# Patient Record
Sex: Female | Born: 1945 | Race: White | Hispanic: No | Smoking: Never smoker
Health system: Southern US, Community
[De-identification: ages and names within clinical notes are randomized; demographics above are authoritative.]

## PROBLEM LIST (undated history)

## (undated) DIAGNOSIS — K219 Gastro-esophageal reflux disease without esophagitis: Secondary | ICD-10-CM

## (undated) DIAGNOSIS — E119 Type 2 diabetes mellitus without complications: Secondary | ICD-10-CM

## (undated) DIAGNOSIS — E039 Hypothyroidism, unspecified: Secondary | ICD-10-CM

## (undated) DIAGNOSIS — N189 Chronic kidney disease, unspecified: Secondary | ICD-10-CM

## (undated) DIAGNOSIS — I10 Essential (primary) hypertension: Secondary | ICD-10-CM

## (undated) DIAGNOSIS — F32A Depression, unspecified: Secondary | ICD-10-CM

## (undated) DIAGNOSIS — F329 Major depressive disorder, single episode, unspecified: Secondary | ICD-10-CM

## (undated) DIAGNOSIS — D649 Anemia, unspecified: Secondary | ICD-10-CM

## (undated) HISTORY — PX: HERNIA REPAIR: SHX51

## (undated) HISTORY — PX: BACK SURGERY: SHX140

## (undated) HISTORY — PX: ACHILLES TENDON REPAIR: SUR1153

## (undated) HISTORY — PX: SHOULDER SURGERY: SHX246

---

## 2006-12-30 ENCOUNTER — Emergency Department: Payer: Self-pay | Admitting: Emergency Medicine

## 2006-12-30 ENCOUNTER — Other Ambulatory Visit: Payer: Self-pay

## 2007-03-04 ENCOUNTER — Emergency Department: Payer: Self-pay | Admitting: Unknown Physician Specialty

## 2011-06-11 DIAGNOSIS — Z86010 Personal history of colonic polyps: Secondary | ICD-10-CM | POA: Insufficient documentation

## 2011-06-11 DIAGNOSIS — E782 Mixed hyperlipidemia: Secondary | ICD-10-CM | POA: Diagnosis present

## 2011-06-11 DIAGNOSIS — Z8742 Personal history of other diseases of the female genital tract: Secondary | ICD-10-CM | POA: Insufficient documentation

## 2011-06-11 DIAGNOSIS — M75100 Unspecified rotator cuff tear or rupture of unspecified shoulder, not specified as traumatic: Secondary | ICD-10-CM | POA: Insufficient documentation

## 2011-06-11 DIAGNOSIS — E039 Hypothyroidism, unspecified: Secondary | ICD-10-CM | POA: Insufficient documentation

## 2012-10-11 DIAGNOSIS — E669 Obesity, unspecified: Secondary | ICD-10-CM | POA: Insufficient documentation

## 2012-11-04 DIAGNOSIS — K219 Gastro-esophageal reflux disease without esophagitis: Secondary | ICD-10-CM | POA: Insufficient documentation

## 2013-02-17 DIAGNOSIS — I8393 Asymptomatic varicose veins of bilateral lower extremities: Secondary | ICD-10-CM | POA: Insufficient documentation

## 2014-01-31 DIAGNOSIS — M48061 Spinal stenosis, lumbar region without neurogenic claudication: Secondary | ICD-10-CM | POA: Insufficient documentation

## 2014-04-25 ENCOUNTER — Ambulatory Visit: Payer: Self-pay | Admitting: Family Medicine

## 2016-02-20 DIAGNOSIS — R9439 Abnormal result of other cardiovascular function study: Secondary | ICD-10-CM | POA: Insufficient documentation

## 2016-03-08 DIAGNOSIS — Z9889 Other specified postprocedural states: Secondary | ICD-10-CM | POA: Insufficient documentation

## 2016-03-09 DIAGNOSIS — I951 Orthostatic hypotension: Secondary | ICD-10-CM | POA: Insufficient documentation

## 2016-05-31 ENCOUNTER — Ambulatory Visit
Admission: EM | Admit: 2016-05-31 | Discharge: 2016-05-31 | Disposition: A | Discharge status: Home / Self Care | Payer: Medicare Other | Attending: Family Medicine | Admitting: Family Medicine

## 2016-05-31 ENCOUNTER — Encounter: Payer: Self-pay | Admitting: Emergency Medicine

## 2016-05-31 DIAGNOSIS — S90511A Abrasion, right ankle, initial encounter: Secondary | ICD-10-CM

## 2016-05-31 HISTORY — DX: Depression, unspecified: F32.A

## 2016-05-31 HISTORY — DX: Type 2 diabetes mellitus without complications: E11.9

## 2016-05-31 HISTORY — DX: Essential (primary) hypertension: I10

## 2016-05-31 HISTORY — DX: Major depressive disorder, single episode, unspecified: F32.9

## 2016-05-31 MED ORDER — MUPIROCIN 2 % EX OINT: TOPICAL_OINTMENT | CUTANEOUS | Status: DC

## 2016-05-31 NOTE — ED Notes (Signed)
Shoe was rubbing against heel of right foot. Now sore and red. Patient is diabetic

## 2016-05-31 NOTE — Discharge Instructions (Signed)
Use medication as prescribed.Keep clean as discussed.   Follow up with your primary care physician this week as needed. Return to Urgent care for new or worsening concerns.    Abrasion An abrasion is a cut or scrape on the outer surface of your skin. An abrasion does not extend through all of the layers of your skin. It is important to care for your abrasion properly to prevent infection. CAUSES Most abrasions are caused by falling on or gliding across the ground or another surface. When your skin rubs on something, the outer and inner layer of skin rubs off.  SYMPTOMS A cut or scrape is the main symptom of this condition. The scrape may be bleeding, or it may appear red or pink. If there was an associated fall, there may be an underlying bruise. DIAGNOSIS An abrasion is diagnosed with a physical exam. TREATMENT Treatment for this condition depends on how large and deep the abrasion is. Usually, your abrasion will be cleaned with water and mild soap. This removes any dirt or debris that may be stuck. An antibiotic ointment may be applied to the abrasion to help prevent infection. A bandage (dressing) may be placed on the abrasion to keep it clean. You may also need a tetanus shot. HOME CARE INSTRUCTIONS Medicines  Take or apply medicines only as directed by your health care provider.  If you were prescribed an antibiotic ointment, finish all of it even if you start to feel better. Wound Care  Clean the wound with mild soap and water 2-3 times per day or as directed by your health care provider. Pat your wound dry with a clean towel. Do not rub it.  There are many different ways to close and cover a wound. Follow instructions from your health care provider about:  Wound care.  Dressing changes and removal.  Check your wound every day for signs of infection. Watch for:  Redness, swelling, or pain.  Fluid, blood, or pus. General Instructions  Keep the dressing dry as directed by  your health care provider. Do not take baths, swim, use a hot tub, or do anything that would put your wound underwater until your health care provider approves.  If there is swelling, raise (elevate) the injured area above the level of your heart while you are sitting or lying down.  Keep all follow-up visits as directed by your health care provider. This is important. SEEK MEDICAL CARE IF:  You received a tetanus shot and you have swelling, severe pain, redness, or bleeding at the injection site.  Your pain is not controlled with medicine.  You have increased redness, swelling, or pain at the site of your wound. SEEK IMMEDIATE MEDICAL CARE IF:  You have a red streak going away from your wound.  You have a fever.  You have fluid, blood, or pus coming from your wound.  You notice a bad smell coming from your wound or your dressing.   This information is not intended to replace advice given to you by your health care provider. Make sure you discuss any questions you have with your health care provider.   Document Released: 08/26/2005 Document Revised: 08/07/2015 Document Reviewed: 11/14/2014 Elsevier Interactive Patient Education 2016 [employer]  Wound Care Taking care of your wound properly can help to prevent pain and infection. It can also help your wound to heal more quickly.  HOW TO CARE FOR YOUR WOUND  Take or apply over-the-counter and prescription medicines only as told by your  health care provider.  If you were prescribed antibiotic medicine, take or apply it as told by your health care provider. Do not stop using the antibiotic even if your condition improves.  Clean the wound each day or as told by your health care provider.  Wash the wound with mild soap and water.  Rinse the wound with water to remove all soap.  Pat the wound dry with a clean towel. Do not rub it.  There are many different ways to close and cover a wound. For example, a wound can be covered  with stitches (sutures), skin glue, or adhesive strips. Follow instructions from your health care provider about:  How to take care of your wound.  When and how you should change your bandage (dressing).  When you should remove your dressing.  Removing whatever was used to close your wound.  Check your wound every day for signs of infection. Watch for:  Redness, swelling, or pain.  Fluid, blood, or pus.  Keep the dressing dry until your health care provider says it can be removed. Do not take baths, swim, use a hot tub, or do anything that would put your wound underwater until your health care provider approves.  Raise (elevate) the injured area above the level of your heart while you are sitting or lying down.  Do not scratch or pick at the wound.  Keep all follow-up visits as told by your health care provider. This is important. SEEK MEDICAL CARE IF:  You received a tetanus shot and you have swelling, severe pain, redness, or bleeding at the injection site.  You have a fever.  Your pain is not controlled with medicine.  You have increased redness, swelling, or pain at the site of your wound.  You have fluid, blood, or pus coming from your wound.  You notice a bad smell coming from your wound or your dressing. SEEK IMMEDIATE MEDICAL CARE IF:  You have a red streak going away from your wound.   This information is not intended to replace advice given to you by your health care provider. Make sure you discuss any questions you have with your health care provider.   Document Released: 08/25/2008 Document Revised: 04/02/2015 Document Reviewed: 11/12/2014 Elsevier Interactive Patient Education [employer]

## 2016-05-31 NOTE — ED Provider Notes (Signed)
[facility]  ____________________________________________  Time seen: Approximately 2:39 PM  I have reviewed the triage vital signs and the nursing notes.   HISTORY  Chief Complaint Foot Injury   HPI Tamara [name] is a 70 y.o. female reports right posterior ankle wound from yesterday. Patient reports that yesterday afternoon she had been for a walk. Patient states that she was wearing a pair of tennis shoes without socks on. Patient states occasionally she'll have shoes rubbing the back of her ankle and cause sores and states that this is what occurred. Denies any fall or direct trauma. Patient states that her right shoe rubbed an area on the back of her heel that is sore and tender. Patient states that she wanted it evaluated as she is a diabetic and does have some peripheral neuropathy. Patient wanted to make sure that it does not become infected, per patient. Patient reports that the area was tender last night when she took over-the-counter Tylenol which helped. Patient states she is not elevated or applied ice. Denies any swelling. Denies any pain radiation, change in sensation, calf tenderness or leg swelling. Reports has remained active and ambulatory throughout the day.  PCP: Dayna BarkerAldridge   Past Medical History  Diagnosis Date  . Diabetes mellitus without complication (HCC)   . Hypertension   . Depression     There are no active problems to display for this patient.   Past Surgical History  Procedure Laterality Date  . Achilles tendon repair    . Back surgery      Current Outpatient Rx  Name  Route  Sig  Dispense  Refill  . atenolol (TENORMIN) 25 MG tablet   Oral   Take 25 mg by mouth daily.         Marland Kitchen. buPROPion (WELLBUTRIN SR) 200 MG 12 hr tablet   Oral   Take 200 mg by mouth 2 (two) times daily.         . insulin aspart protamine- aspart (NOVOLOG MIX 70/30) (70-30) 100 UNIT/ML injection   Subcutaneous   Inject 42 Units into the skin daily with  breakfast.         . metFORMIN (GLUMETZA) 1000 MG (MOD) 24 hr tablet   Oral   Take 1,000 mg by mouth 2 (two) times daily with a meal.         . valsartan (DIOVAN) 80 MG tablet   Oral   Take 80 mg by mouth daily.           Allergies Review of patient's allergies indicates no known allergies.  No family history on file.  Social History Social History  Substance Use Topics  . Smoking status: Never Smoker   . Smokeless tobacco: Never Used  . Alcohol Use: No    Review of Systems Constitutional: No fever/chills Eyes: No visual changes. ENT: No sore throat. Cardiovascular: Denies chest pain. Respiratory: Denies shortness of breath. Gastrointestinal: No abdominal pain.  No nausea, no vomiting.  No diarrhea.  No constipation. Genitourinary: Negative for dysuria. Musculoskeletal: Negative for back pain. Skin: Negative for rash. As above.  Neurological: Negative for headaches, focal weakness or numbness.  10-point ROS otherwise negative.  ____________________________________________   PHYSICAL EXAM:  VITAL SIGNS: ED Triage Vitals  Enc Vitals Group     BP 05/31/16 1409 129/80 mmHg     Pulse Rate 05/31/16 1409 84     Resp 05/31/16 1409 16     Temp 05/31/16 1409 97.6 F (36.4 C)  Temp src --      SpO2 05/31/16 1409 97 %     Weight 05/31/16 1409 186 lb (84.369 kg)     Height 05/31/16 1409 5\' 7"  (1.702 m)     Head Cir --      Peak Flow --      Pain Score 05/31/16 1409 7     Pain Loc --      Pain Edu? --      Excl. in GC? --     Constitutional: Alert and oriented. Well appearing and in no acute distress. Eyes: Conjunctivae are normal. PERRL. EOMI. Head: Atraumatic.  Ears: normal external appearance bilaterally.  Mouth/Throat: Mucous membranes are moist. Neck: No stridor.  No cervical spine tenderness to palpation. Cardiovascular: Normal rate, regular rhythm. Grossly normal heart sounds.  Good peripheral circulation. Respiratory: Normal respiratory  effort.  No retractions. Lungs CTAB. No wheezes, rales or rhonchi.  Gastrointestinal: Soft and nontender. Musculoskeletal: No lower or upper extremity tenderness nor edema. No cervical, thoracic or lumbar tenderness to palpation. Bilateral pedal pulses equal and easily palpated.  Neurologic:  Normal speech and language. No gross focal neurologic deficits are appreciated. No gait instability. Skin:  Skin is warm, dry and intact. No rash noted. Except: right posterior upper heel 1 cm superficial abrasion, crusted, minimal erythema, no surrounding erythema, nontender, no swelling, no bony tenderness, full range of motion.  Psychiatric: Mood and affect are normal. Speech and behavior are normal.  ____________________________________________   LABS (all labs ordered are listed, but only abnormal results are displayed)  Labs Reviewed - No data to display   INITIAL IMPRESSION / ASSESSMENT AND PLAN / ED COURSE  Pertinent labs & imaging results that were available during my care of the patient were reviewed by me and considered in my medical decision making (see chart for details).  Very well-appearing patient. No acute distress. Patient's presenting for superficial posterior calcaneus abrasion in which she obtained after appear shoes rubbing the area. Minimal erythema directly around abrasion site, no further surrounding erythema, nontender to palpation, no fluctuance or induration. Patient with a right posterior calcaneus abrasion. Discussed and wound cleaning and wound cleaner. Will treat with topical Bactroban and dressing applied. Encouraged keeping clean with soap and water, ice and elevate. Encourage monitoring at home.Discussed indication, risks and benefits of medications with patient.  Discussed follow up with Primary care physician this week. Discussed follow up and return parameters including no resolution or any worsening concerns. Patient verbalized understanding and agreed to plan.    ____________________________________________   FINAL CLINICAL IMPRESSION(S) / ED DIAGNOSES  Final diagnoses:  Ankle abrasion, right, initial encounter     Discharge Medication List as of 05/31/2016  2:41 PM    START taking these medications   Details  mupirocin ointment (BACTROBAN) 2 % Apply three times a day for 5 days., Normal        Note: This dictation was prepared with Dragon dictation along with smaller phrase technology. Any transcriptional errors that result from this process are unintentional.       Renford DillsLindsey Evangelia Whitaker, NP 06/02/16 [phone]

## 2016-06-26 ENCOUNTER — Ambulatory Visit
Admission: EM | Admit: 2016-06-26 | Discharge: 2016-06-26 | Disposition: A | Discharge status: Home / Self Care | Payer: Medicare Other | Attending: Family Medicine | Admitting: Family Medicine

## 2016-06-26 DIAGNOSIS — W57XXXA Bitten or stung by nonvenomous insect and other nonvenomous arthropods, initial encounter: Secondary | ICD-10-CM

## 2016-06-26 DIAGNOSIS — T148 Other injury of unspecified body region: Secondary | ICD-10-CM

## 2016-06-26 MED ORDER — CEPHALEXIN 500 MG PO CAPS: 500.0000 mg | ORAL_CAPSULE | Freq: Three times a day (TID) | ORAL | 0 refills | Status: DC

## 2016-06-26 NOTE — ED Provider Notes (Signed)
[facility]    CSN: [id number] Arrival date & time: 06/26/16  1300  First Provider Contact:  None       History   Chief Complaint Chief Complaint  Patient presents with  . Insect Bite    HPI Zylah Tutt is a 70 y.o. female.   70 yo diabetic female with a c/o right shin redness and pain at puncture wound, possibly from insect bite. Denies any fevers, chills, swelling.    The history is provided by the patient.    Past Medical History:  Diagnosis Date  . Depression   . Diabetes mellitus without complication (HCC)   . Hypertension     There are no active problems to display for this patient.   Past Surgical History:  Procedure Laterality Date  . ACHILLES TENDON REPAIR    . BACK SURGERY      OB History    No data available       Home Medications    Prior to Admission medications   Medication Sig Start Date End Date Taking? Authorizing Provider  atenolol (TENORMIN) 25 MG tablet Take 25 mg by mouth daily.   Yes Historical Provider, MD  buPROPion (WELLBUTRIN SR) 200 MG 12 hr tablet Take 200 mg by mouth 2 (two) times daily.   Yes Historical Provider, MD  insulin aspart protamine- aspart (NOVOLOG MIX 70/30) (70-30) 100 UNIT/ML injection Inject 42 Units into the skin daily with breakfast.   Yes Historical Provider, MD  metFORMIN (GLUMETZA) 1000 MG (MOD) 24 hr tablet Take 1,000 mg by mouth 2 (two) times daily with a meal.   Yes Historical Provider, MD  valsartan (DIOVAN) 80 MG tablet Take 80 mg by mouth daily.   Yes Historical Provider, MD  cephALEXin (KEFLEX) 500 MG capsule Take 1 capsule (500 mg total) by mouth 3 (three) times daily. 06/26/16   Payton Mccallum, MD  mupirocin ointment (BACTROBAN) 2 % Apply three times a day for 5 days. 05/31/16   Renford Dills, NP    Family History History reviewed. No pertinent family history.  Social History Social History  Substance Use Topics  . Smoking status: Never Smoker  . Smokeless tobacco: Never Used  .  Alcohol use No     Allergies   Review of patient's allergies indicates no known allergies.   Review of Systems Review of Systems   Physical Exam Triage Vital Signs ED Triage Vitals  Enc Vitals Group     BP 06/26/16 1430 119/71     Pulse Rate 06/26/16 1430 72     Resp 06/26/16 1430 18     Temp 06/26/16 1430 98.2 F (36.8 C)     Temp Source 06/26/16 1430 Oral     SpO2 06/26/16 1430 97 %     Weight 06/26/16 1430 185 lb (83.9 kg)     Height 06/26/16 1430 5\' 7"  (1.702 m)     Head Circumference --      Peak Flow --      Pain Score 06/26/16 1429 8     Pain Loc --      Pain Edu? --      Excl. in GC? --    No data found.   Updated Vital Signs BP 119/71 (BP Location: Right Arm)   Pulse 72   Temp 98.2 F (36.8 C) (Oral)   Resp 18   Ht 5\' 7"  (1.702 m)   Wt 185 lb (83.9 kg)   SpO2 97%   BMI 28.98 kg/m  Visual Acuity Right Eye Distance:   Left Eye Distance:   Bilateral Distance:    Right Eye Near:   Left Eye Near:    Bilateral Near:     Physical Exam  Constitutional: She appears well-developed and well-nourished. No distress.  Skin: She is not diaphoretic. There is erythema.  1.5 cm erythematous skin area on right shin with pinpoint puncture wound; no drainage  Nursing note and vitals reviewed.    UC Treatments / Results  Labs (all labs ordered are listed, but only abnormal results are displayed) Labs Reviewed - No data to display  EKG  EKG Interpretation None       Radiology No results found.  Procedures Procedures (including critical care time)  Medications Ordered in UC Medications - No data to display   Initial Impression / Assessment and Plan / UC Course  I have reviewed the triage vital signs and the nursing notes.  Pertinent labs & imaging results that were available during my care of the patient were reviewed by me and considered in my medical decision making (see chart for details).  Clinical Course      Final Clinical  Impressions(s) / UC Diagnoses   Final diagnoses:  Insect bite    New Prescriptions Discharge Medication List as of 06/26/2016  2:49 PM    START taking these medications   Details  cephALEXin (KEFLEX) 500 MG capsule Take 1 capsule (500 mg total) by mouth 3 (three) times daily., Starting Fri 06/26/2016, Normal        1.  Diagnosis reviewed with patient 2. rx as per orders above; reviewed possible side effects, interactions, risks and benefits  3. Recommend supportive treatment with warm compresses 4. Follow-up prn if symptoms worsen or don't improve   Payton Mccallum, MD 06/26/16 1521

## 2016-06-26 NOTE — ED Triage Notes (Signed)
Patient woke up with pain on her right knee, and saw what appeared to her to be a insect bite. She has tried over the counter meds to help but with no relief. There is a small blister in this area.

## 2016-06-29 ENCOUNTER — Other Ambulatory Visit: Payer: Self-pay | Admitting: Family Medicine

## 2016-06-29 DIAGNOSIS — Z780 Asymptomatic menopausal state: Secondary | ICD-10-CM

## 2016-09-24 DIAGNOSIS — R259 Unspecified abnormal involuntary movements: Secondary | ICD-10-CM | POA: Insufficient documentation

## 2016-09-24 DIAGNOSIS — R2681 Unsteadiness on feet: Secondary | ICD-10-CM | POA: Insufficient documentation

## 2016-09-28 ENCOUNTER — Other Ambulatory Visit: Payer: Self-pay | Admitting: Nurse Practitioner

## 2016-09-28 DIAGNOSIS — R251 Tremor, unspecified: Secondary | ICD-10-CM

## 2016-09-28 DIAGNOSIS — R2681 Unsteadiness on feet: Secondary | ICD-10-CM

## 2016-10-10 ENCOUNTER — Ambulatory Visit
Admission: RE | Admit: 2016-10-10 | Discharge: 2016-10-10 | Disposition: A | Discharge status: Home / Self Care | Payer: Medicare Other | Source: Ambulatory Visit | Attending: Nurse Practitioner | Admitting: Nurse Practitioner

## 2016-10-10 DIAGNOSIS — R251 Tremor, unspecified: Secondary | ICD-10-CM | POA: Insufficient documentation

## 2016-10-10 DIAGNOSIS — G319 Degenerative disease of nervous system, unspecified: Secondary | ICD-10-CM | POA: Insufficient documentation

## 2016-10-10 DIAGNOSIS — R2681 Unsteadiness on feet: Secondary | ICD-10-CM | POA: Diagnosis not present

## 2016-10-10 LAB — POCT I-STAT CREATININE: CREATININE: 1.1 mg/dL — AB (ref 0.44–1.00)

## 2016-10-10 MED ORDER — GADOBENATE DIMEGLUMINE 529 MG/ML IV SOLN
20.0000 mL | Freq: Once | INTRAVENOUS | Status: AC | PRN
  Administered 2016-10-10: 17 mL via INTRAVENOUS

## 2016-10-28 ENCOUNTER — Ambulatory Visit
Admission: EM | Admit: 2016-10-28 | Discharge: 2016-10-28 | Disposition: A | Discharge status: Home / Self Care | Payer: Medicare Other | Attending: Family Medicine | Admitting: Family Medicine

## 2016-10-28 ENCOUNTER — Ambulatory Visit: Payer: Medicare Other | Attending: Family Medicine

## 2016-10-28 ENCOUNTER — Ambulatory Visit: Admit: 2016-10-28 | Payer: Medicare Other

## 2016-10-28 ENCOUNTER — Ambulatory Visit (HOSPITAL_COMMUNITY): Payer: Medicare Other

## 2016-10-28 DIAGNOSIS — S90812A Abrasion, left foot, initial encounter: Secondary | ICD-10-CM | POA: Diagnosis not present

## 2016-10-28 DIAGNOSIS — M79672 Pain in left foot: Secondary | ICD-10-CM | POA: Insufficient documentation

## 2016-10-28 DIAGNOSIS — R52 Pain, unspecified: Secondary | ICD-10-CM

## 2016-10-28 DIAGNOSIS — S9032XA Contusion of left foot, initial encounter: Secondary | ICD-10-CM | POA: Diagnosis not present

## 2016-10-28 MED ORDER — TETANUS-DIPHTH-ACELL PERTUSSIS 5-2.5-18.5 LF-MCG/0.5 IM SUSP
0.5000 mL | Freq: Once | INTRAMUSCULAR | Status: AC
  Administered 2016-10-28: 0.5 mL via INTRAMUSCULAR

## 2016-10-28 NOTE — ED Notes (Signed)
Post op shoe applied. PMS intact post application

## 2016-10-28 NOTE — Discharge Instructions (Signed)
Rest. Ice. Elevate. Wear boot as long as pain continues.   Follow up with podiatry as discussed.   Follow up with your primary care physician this week as needed. Return to Urgent care for new or worsening concerns.

## 2016-10-28 NOTE — ED Provider Notes (Signed)
[facility] ____________________________________________  Time seen: Approximately 2:18 PM  I have reviewed the triage vital signs and the nursing notes.   HISTORY  Chief Complaint Foot Injury   HPI Tamara [name] is a 70 y.o. female presenting for the complaints of left foot pain. Patient reports that 2 days ago she accidentally dropped a book end on her left foot and ankle. Patient reports mild pain at first, pain improved, however pain worsened this morning. Denies any other fall or injury. Denies history of similar. Denies pain radiation. Patient reports that she does have baseline peripheral neuropathy, denies any acute change in sensation. Denies any decreased movement but reports mild pain at this time. Patient reports over-the-counter Aleve has been helping her symptoms. Denies fall to the ground. Denies head injury or loss consciousness. Denies any other pain or injury. Patient unsure of last tetanus immunization.  [facility]: PCP     Past Medical History:  Diagnosis Date  . Depression   . Diabetes mellitus without complication (HCC)   . Hypertension     There are no active problems to display for this patient.   Past Surgical History:  Procedure Laterality Date  . ACHILLES TENDON REPAIR    . BACK SURGERY    . HERNIA REPAIR    . SHOULDER SURGERY      Current Outpatient Rx  . Order #: [id number] Class: Historical Med  . Order #: [id number] Class: Historical Med  . Order #: [id number] Class: Historical Med  . Order #: [id number] Class: Historical Med  . Order #: [id number] Class: Historical Med  . Order #: [id number] Class: Normal  . Order #: [id number] Class: Historical Med  . Order #: [id number] Class: Historical Med  . Order #: [id number] Class: Normal  . Order #: [id number] Class: Historical Med    No current facility-administered medications for this encounter.   Current Outpatient Prescriptions:  .  cyclobenzaprine (FLEXERIL) 10 MG tablet,  Take 10 mg by mouth 3 (three) times daily as needed for muscle spasms., Disp: , Rfl:  .  DULoxetine (CYMBALTA) 60 MG capsule, Take 60 mg by mouth daily., Disp: , Rfl:  .  metoprolol succinate (TOPROL-XL) 25 MG 24 hr tablet, Take 25 mg by mouth daily., Disp: , Rfl:  .  atenolol (TENORMIN) 25 MG tablet, Take 25 mg by mouth daily., Disp: , Rfl:  .  buPROPion (WELLBUTRIN SR) 200 MG 12 hr tablet, Take 200 mg by mouth 2 (two) times daily., Disp: , Rfl:  .  cephALEXin (KEFLEX) 500 MG capsule, Take 1 capsule (500 mg total) by mouth 3 (three) times daily., Disp: 21 capsule, Rfl: 0 .  insulin aspart protamine- aspart (NOVOLOG MIX 70/30) (70-30) 100 UNIT/ML injection, Inject 42 Units into the skin daily with breakfast., Disp: , Rfl:  .  metFORMIN (GLUMETZA) 1000 MG (MOD) 24 hr tablet, Take 1,000 mg by mouth 2 (two) times daily with a meal., Disp: , Rfl:  .  mupirocin ointment (BACTROBAN) 2 %, Apply three times a day for 5 days., Disp: 22 g, Rfl: 0 .  valsartan (DIOVAN) 80 MG tablet, Take 40 mg by mouth daily. , Disp: , Rfl:   Allergies Patient has no known allergies.  History reviewed. No pertinent family history.  Social History Social History  Substance Use Topics  . Smoking status: Never Smoker  . Smokeless tobacco: Never Used  . Alcohol use No    Review of Systems Constitutional: No fever/chills Eyes: No visual changes. ENT: No sore throat. Cardiovascular: Denies chest pain. Respiratory: Denies shortness  of breath. Gastrointestinal: No abdominal pain.  No nausea, no vomiting.  No diarrhea.  No constipation. Genitourinary: Negative for dysuria. Musculoskeletal: Negative for back pain.As above.  Skin: Negative for rash. Neurological: Negative for headaches, focal weakness or numbness.  10-point ROS otherwise negative.  ____________________________________________   PHYSICAL EXAM:  VITAL SIGNS: ED Triage Vitals  Enc Vitals Group     BP 10/28/16 1330 132/71     Pulse Rate 10/28/16  1330 83     Resp 10/28/16 1330 20     Temp 10/28/16 1330 98.2 F (36.8 C)     Temp Source 10/28/16 1330 Oral     SpO2 10/28/16 1330 97 %     Weight 10/28/16 1331 190 lb (86.2 kg)     Height 10/28/16 1331 5\' 7"  (1.702 m)     Head Circumference --      Peak Flow --      Pain Score 10/28/16 1335 5     Pain Loc --      Pain Edu? --      Excl. in GC? --     Constitutional: Alert and oriented. Well appearing and in no acute distress. Eyes: Conjunctivae are normal. PERRL. EOMI. ENT      Head: Normocephalic and atraumatic.      Mouth/Throat: Mucous membranes are moist.Oropharynx non-erythematous. Cardiovascular: Normal rate, regular rhythm. Grossly normal heart sounds.  Good peripheral circulation. Respiratory: Normal respiratory effort without tachypnea nor retractions. Breath sounds are clear and equal bilaterally. No wheezes/rales/rhonchi.. Musculoskeletal:  Nontender with normal range of motion in all extremities.  Bilateral pedal pulses equal and easily palpated.      Right lower leg:  No tenderness or edema.      Left lower leg:  No tenderness or edema. Except: Left lateral foot mild tenderness to palpation. Left dorsal mid foot localized mild to moderate tenderness to direct palpation over proximal second and third and fourth tarsals, minimal swelling, minimal ecchymosis, lateral foot with superficial abrasion noted without surrounding erythema drainage or induration, full range of motion present, no motor or tendon deficit, mild pain with plantar flexion and dorsiflexion resisted, slight diminished distal sensation which per patient has chronic secondary to peripheral neuropathy, normal distal capillary refill. Ambulatory with mild antalgic gait. Neurologic:  Normal speech and language. No gross focal neurologic deficits are appreciated. Speech is normal.   Skin:  Skin is warm, dry and intact. No rash noted. Psychiatric: Mood and affect are normal. Speech and behavior are normal. Patient  exhibits appropriate insight and judgment   ___________________________________________   LABS (all labs ordered are listed, but only abnormal results are displayed)  Labs Reviewed - No data to display   RADIOLOGY  Dg Foot Complete Left  Result Date: 10/28/2016 CLINICAL DATA:  70 year old female dropped a book on her foot 2 days ago with bruising and pain. Initial encounter. EXAM: LEFT FOOT - COMPLETE 3+ VIEW COMPARISON:  None. FINDINGS: Bone mineralization is within normal limits for age. Calcaneus intact with degenerative spurring. Tarsal bone alignment appears normal. Accessory ossicle adjacent to the navicular. Metatarsals and phalanges appear intact. Distal joint spaces and alignment within normal limits. IMPRESSION: No acute fracture or dislocation identified about the left foot. Electronically Signed   By: Odessa FlemingH  Hall M.D.   On: 10/28/2016 15:37   ____________________________________________   PROCEDURES Procedures   ___________________________________________   INITIAL IMPRESSION / ASSESSMENT AND PLAN / ED COURSE  Pertinent labs & imaging results that were available during my care of the  patient were reviewed by me and considered in my medical decision making (see chart for details).   Well-appearing patient. No acute distress. Presents with complaints of left foot pain post mechanical injury. Will evaluate left foot x-ray. Superficial abrasion noted from acute injury, unsure of last tetanus immunization, will update tetanus.   3pm: Issues present regarding x-ray processing, still awaiting x-rays.   Left foot x-ray reviewed and per radiologist no acute bony abnormality noted. Discussed in detail with patient proximal second and third metatarsal area of concern of possible fracture, and discussed with patient will place in postop shoe, encouraged ice, elevation and rest. Discussed in detail with patient to follow-up with podiatry in 1 week as needed for any continued pain for  reassessment and reevaluation of possible fracture. Patient denies need for pain medication and she states that she has Aleve and tramadol at home as needed. Patient and spouse at bedside and provides understanding and agreed to this plan. Podiatry information given.  Discussed follow up with Primary care physician this week. Discussed follow up and return parameters including no resolution or any worsening concerns. Patient verbalized understanding and agreed to plan.   ____________________________________________   FINAL CLINICAL IMPRESSION(S) / ED DIAGNOSES  Final diagnoses:  Contusion of left foot, initial encounter  Abrasion of left foot, initial encounter     Discharge Medication List as of 10/28/2016  3:52 PM      Note: This dictation was prepared with Dragon dictation along with smaller phrase technology. Any transcriptional errors that result from this process are unintentional.    Clinical Course       Renford Dills, NP 10/28/16 1620

## 2016-10-28 NOTE — ED Triage Notes (Signed)
Pt reports she dropped a heavy book-end on her left foot. Left ankle bruising noted although she denies pain to the ankle. Foot was swollen this a.m. And has been icing and elevating it. Pain is to dorsum of foot near the toes. Pain 5/10.

## 2016-11-03 ENCOUNTER — Other Ambulatory Visit: Payer: Self-pay | Admitting: Orthopedic Surgery

## 2016-11-03 DIAGNOSIS — M4712 Other spondylosis with myelopathy, cervical region: Secondary | ICD-10-CM

## 2016-11-03 DIAGNOSIS — M48062 Spinal stenosis, lumbar region with neurogenic claudication: Secondary | ICD-10-CM

## 2016-11-13 ENCOUNTER — Ambulatory Visit: Payer: Medicare Other

## 2017-02-05 DIAGNOSIS — L409 Psoriasis, unspecified: Secondary | ICD-10-CM | POA: Insufficient documentation

## 2017-02-05 DIAGNOSIS — M4714 Other spondylosis with myelopathy, thoracic region: Secondary | ICD-10-CM | POA: Insufficient documentation

## 2017-02-05 DIAGNOSIS — Z981 Arthrodesis status: Secondary | ICD-10-CM | POA: Insufficient documentation

## 2017-02-05 DIAGNOSIS — Z8614 Personal history of Methicillin resistant Staphylococcus aureus infection: Secondary | ICD-10-CM | POA: Insufficient documentation

## 2017-02-05 DIAGNOSIS — G4730 Sleep apnea, unspecified: Secondary | ICD-10-CM | POA: Diagnosis present

## 2017-02-15 DIAGNOSIS — M4324 Fusion of spine, thoracic region: Secondary | ICD-10-CM | POA: Insufficient documentation

## 2017-03-30 ENCOUNTER — Ambulatory Visit: Payer: Medicare Other

## 2017-03-30 ENCOUNTER — Ambulatory Visit
Admission: EM | Admit: 2017-03-30 | Discharge: 2017-03-30 | Disposition: A | Discharge status: Home / Self Care | Payer: Medicare Other | Attending: Family Medicine | Admitting: Family Medicine

## 2017-03-30 DIAGNOSIS — E119 Type 2 diabetes mellitus without complications: Secondary | ICD-10-CM | POA: Diagnosis not present

## 2017-03-30 DIAGNOSIS — Z7984 Long term (current) use of oral hypoglycemic drugs: Secondary | ICD-10-CM | POA: Insufficient documentation

## 2017-03-30 DIAGNOSIS — S62112A Displaced fracture of triquetrum [cuneiform] bone, left wrist, initial encounter for closed fracture: Secondary | ICD-10-CM

## 2017-03-30 DIAGNOSIS — F329 Major depressive disorder, single episode, unspecified: Secondary | ICD-10-CM | POA: Diagnosis not present

## 2017-03-30 DIAGNOSIS — W19XXXA Unspecified fall, initial encounter: Secondary | ICD-10-CM | POA: Diagnosis not present

## 2017-03-30 DIAGNOSIS — M25531 Pain in right wrist: Secondary | ICD-10-CM | POA: Diagnosis present

## 2017-03-30 DIAGNOSIS — Z9889 Other specified postprocedural states: Secondary | ICD-10-CM | POA: Insufficient documentation

## 2017-03-30 DIAGNOSIS — S62115A Nondisplaced fracture of triquetrum [cuneiform] bone, left wrist, initial encounter for closed fracture: Secondary | ICD-10-CM | POA: Insufficient documentation

## 2017-03-30 DIAGNOSIS — X58XXXA Exposure to other specified factors, initial encounter: Secondary | ICD-10-CM | POA: Diagnosis not present

## 2017-03-30 DIAGNOSIS — Z79899 Other long term (current) drug therapy: Secondary | ICD-10-CM | POA: Diagnosis not present

## 2017-03-30 DIAGNOSIS — I10 Essential (primary) hypertension: Secondary | ICD-10-CM | POA: Diagnosis not present

## 2017-03-30 NOTE — ED Notes (Signed)
Ice to left wrist.

## 2017-03-30 NOTE — ED Provider Notes (Signed)
CSN: [id number]     Arrival date & time 03/30/17  1323 History   First MD Initiated Contact with Patient 03/30/17 1447     Chief Complaint  Patient presents with  . Wrist Pain   (Consider location/radiation/quality/duration/timing/severity/associated sxs/prior Treatment) HPI  This is a 71 year old female who presents accompanied by her husband when she fell last night onto her left side injuring her hip knee and her left wrist. The pain presently is of her left wrist just distal to the ulnar styloid. There is ecchymosis and swelling in this area. She states the pain is 8/10. She remembers the fall did not have dizziness or near-syncope or syncopal episode. She's had multiple back surgeries her last one in March and has had several falls over the past month. He did fall in the nursing home where she was undergoing rehabilitation x-rays of her wrist were taking at that time which did not show any fractures. Her knee and hip are not bothering her at the present time.       Past Medical History:  Diagnosis Date  . Depression   . Diabetes mellitus without complication (HCC)   . Hypertension    Past Surgical History:  Procedure Laterality Date  . ACHILLES TENDON REPAIR    . BACK SURGERY    . HERNIA REPAIR    . SHOULDER SURGERY     Family History  Problem Relation Age of Onset  . Cancer Mother    Social History  Substance Use Topics  . Smoking status: Never Smoker  . Smokeless tobacco: Never Used  . Alcohol use No   OB History    No data available     Review of Systems  Constitutional: Positive for activity change. Negative for appetite change, chills, fatigue and fever.  Musculoskeletal: Positive for joint swelling.  All other systems reviewed and are negative.   Allergies  Patient has no known allergies.  Home Medications   Prior to Admission medications   Medication Sig Start Date End Date Taking? Authorizing Provider  acetaminophen (TYLENOL) 325 MG tablet Take 650  mg by mouth every 6 (six) hours as needed.   Yes Historical Provider, MD  oxycodone (OXY-IR) 5 MG capsule Take 5 mg by mouth every 4 (four) hours as needed.   Yes Historical Provider, MD  atenolol (TENORMIN) 25 MG tablet Take 25 mg by mouth daily.    Historical Provider, MD  buPROPion (WELLBUTRIN SR) 200 MG 12 hr tablet Take 200 mg by mouth 2 (two) times daily.    Historical Provider, MD  cyclobenzaprine (FLEXERIL) 10 MG tablet Take 10 mg by mouth 3 (three) times daily as needed for muscle spasms.    Historical Provider, MD  DULoxetine (CYMBALTA) 60 MG capsule Take 60 mg by mouth daily.    Historical Provider, MD  insulin aspart protamine- aspart (NOVOLOG MIX 70/30) (70-30) 100 UNIT/ML injection Inject 42 Units into the skin daily with breakfast.    Historical Provider, MD  metFORMIN (GLUMETZA) 1000 MG (MOD) 24 hr tablet Take 1,000 mg by mouth 2 (two) times daily with a meal.    Historical Provider, MD  metoprolol succinate (TOPROL-XL) 25 MG 24 hr tablet Take 25 mg by mouth daily.    Historical Provider, MD  mupirocin ointment (BACTROBAN) 2 % Apply three times a day for 5 days. 05/31/16   Renford Dills, NP  valsartan (DIOVAN) 80 MG tablet Take 40 mg by mouth daily.     Historical Provider, MD   Meds Ordered  and Administered this Visit  Medications - No data to display  BP 127/75 (BP Location: Left Arm)   Pulse 89   Temp 98.7 F (37.1 C) (Oral)   Resp 18   Ht  (1.702 m)   Wt 180 lb (81.6 kg)   SpO2 97%   BMI 28.19 kg/m  No data found.   Physical Exam  Constitutional: She appears well-developed and well-nourished. No distress.  HENT:  Head: Normocephalic.  Eyes: EOM are normal. Pupils are equal, round, and reactive to light. Right eye exhibits no discharge. Left eye exhibits no discharge.  Neck: Normal range of motion.  Musculoskeletal: She exhibits edema, tenderness and deformity.  Examination of the left nondominant wrist was so tenderness and swelling just distal to the ulnar  styloid. There is a small amount of ecchymosis present. Patient is reluctant to flex or extend her wrist. Ulnar deviation and radial deviation are also painful. Pronation supination are not painful. Vascular function is intact distally.  Neurological: She is alert.  Skin: Skin is warm and dry. She is not diaphoretic.  Psychiatric: She has a normal mood and affect. Her behavior is normal. Judgment and thought content normal.  Nursing note and vitals reviewed.   Urgent Care Course     Procedures (including critical care time)  Labs Review Labs Reviewed - No data to display  Imaging Review Dg Wrist Complete Left  Result Date: 03/30/2017 CLINICAL DATA:  Pain following fall EXAM: LEFT WRIST - COMPLETE 3+ VIEW COMPARISON:  None. FINDINGS: Frontal, oblique, lateral, and ulnar deviation scaphoid images were obtained it. There is an avulsion off the dorsal aspect of the triquetrum, best appreciated on the lateral view. No other fracture is evident. There is scapholunate disassociation. No frank dislocation. There is calcification in the triangular fibrocartilage region as well as in the scapholunate junction region. There is no appreciable joint space narrowing or erosion. IMPRESSION: Fracture arising from the dorsal triquetrum. 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 Calcification in the triangular fibrocartilage is likely due to chronic tear in this area. No frank dislocation. These results will be called to the ordering clinician or representative by the Radiologist Assistant, and communication documented in the PACS or zVision Dashboard. Electronically Signed   By: Bretta Bang III M.D.   On: 03/30/2017 14:18     Visual Acuity Review  Right Eye Distance:   Left Eye Distance:   Bilateral Distance:    Right Eye Near:   Left Eye Near:    Bilateral Near:    Patient was given an ulnar gutter splint.     MDM   1. Triquetral  chip fracture, left, closed, initial encounter    Plan: 1. Test/x-ray results and diagnosis reviewed with patient 2. rx as per orders; risks, benefits, potential side effects reviewed with patient 3. Recommend supportive treatment with Ice and elevation which was instructed to the patient. She is on oxycodone already for her recent back surgery. Recommended that she consider using ibuprofen as necessary to supplement the oxycodone for pain. She will require orthopedic evaluation and follow-up. The patient was given the name and address phone number of a local group. He'll call this afternoon to schedule an appointment as soon as possible. 4. F/u prn if symptoms worsen or don't improve     Lutricia Feil, PA-C 03/30/17 1537

## 2017-03-30 NOTE — ED Triage Notes (Addendum)
Pt reports she fell last night and fell onto left side injuring her hip, knee, and her left wrist. Pain 8/10 with mild swelling to left wrist. Knee and hip "aren't really bothering me".  Fell to the floor and unsure how she had her hand positioned. Denies dizziness prior to fall. She has had multiple back surgeries and has had several falls over past month. Uses a walker but wasn't using it last p.m.

## 2017-05-13 ENCOUNTER — Other Ambulatory Visit: Payer: Self-pay | Admitting: Surgery

## 2017-05-13 DIAGNOSIS — K439 Ventral hernia without obstruction or gangrene: Secondary | ICD-10-CM

## 2017-05-18 ENCOUNTER — Ambulatory Visit
Admission: RE | Admit: 2017-05-18 | Discharge: 2017-05-18 | Disposition: A | Discharge status: Home / Self Care | Payer: Medicare Other | Source: Ambulatory Visit | Attending: Surgery | Admitting: Surgery

## 2017-05-18 ENCOUNTER — Other Ambulatory Visit
Admission: RE | Admit: 2017-05-18 | Discharge: 2017-05-18 | Disposition: A | Discharge status: Home / Self Care | Payer: Medicare Other | Source: Ambulatory Visit | Attending: Surgery | Admitting: Surgery

## 2017-05-18 DIAGNOSIS — K439 Ventral hernia without obstruction or gangrene: Secondary | ICD-10-CM | POA: Insufficient documentation

## 2017-05-18 DIAGNOSIS — I700 Atherosclerosis of aorta: Secondary | ICD-10-CM | POA: Diagnosis not present

## 2017-05-18 LAB — BUN: BUN: 32 mg/dL — AB (ref 6–20)

## 2017-05-18 LAB — CREATININE, SERUM
Creatinine, Ser: 1.16 mg/dL — ABNORMAL HIGH (ref 0.44–1.00)
GFR calc Af Amer: 54 mL/min — ABNORMAL LOW (ref >60)
GFR calc non Af Amer: 47 mL/min — ABNORMAL LOW (ref >60)

## 2017-05-18 MED ORDER — IOPAMIDOL (ISOVUE-300) INJECTION 61%
100.0000 mL | Freq: Once | INTRAVENOUS | Status: AC | PRN
  Administered 2017-05-18: 100 mL via INTRAVENOUS

## 2017-06-10 DIAGNOSIS — G250 Essential tremor: Secondary | ICD-10-CM | POA: Insufficient documentation

## 2017-06-10 DIAGNOSIS — E1140 Type 2 diabetes mellitus with diabetic neuropathy, unspecified: Secondary | ICD-10-CM | POA: Insufficient documentation

## 2017-08-16 DIAGNOSIS — R32 Unspecified urinary incontinence: Secondary | ICD-10-CM | POA: Insufficient documentation

## 2017-09-30 DIAGNOSIS — N183 Chronic kidney disease, stage 3 unspecified: Secondary | ICD-10-CM | POA: Insufficient documentation

## 2017-11-24 ENCOUNTER — Emergency Department: Payer: Medicare Other

## 2017-11-24 ENCOUNTER — Emergency Department
Admission: EM | Admit: 2017-11-24 | Discharge: 2017-11-24 | Disposition: A | Discharge status: Home / Self Care | Payer: Medicare Other | Attending: Emergency Medicine | Admitting: Emergency Medicine

## 2017-11-24 ENCOUNTER — Encounter: Payer: Self-pay | Admitting: Emergency Medicine

## 2017-11-24 DIAGNOSIS — W010XXA Fall on same level from slipping, tripping and stumbling without subsequent striking against object, initial encounter: Secondary | ICD-10-CM | POA: Diagnosis not present

## 2017-11-24 DIAGNOSIS — Y929 Unspecified place or not applicable: Secondary | ICD-10-CM | POA: Insufficient documentation

## 2017-11-24 DIAGNOSIS — Z794 Long term (current) use of insulin: Secondary | ICD-10-CM | POA: Diagnosis not present

## 2017-11-24 DIAGNOSIS — S4991XA Unspecified injury of right shoulder and upper arm, initial encounter: Secondary | ICD-10-CM | POA: Diagnosis present

## 2017-11-24 DIAGNOSIS — E119 Type 2 diabetes mellitus without complications: Secondary | ICD-10-CM | POA: Diagnosis not present

## 2017-11-24 DIAGNOSIS — S42211A Unspecified displaced fracture of surgical neck of right humerus, initial encounter for closed fracture: Secondary | ICD-10-CM | POA: Diagnosis not present

## 2017-11-24 DIAGNOSIS — F329 Major depressive disorder, single episode, unspecified: Secondary | ICD-10-CM | POA: Diagnosis not present

## 2017-11-24 DIAGNOSIS — Z79899 Other long term (current) drug therapy: Secondary | ICD-10-CM | POA: Insufficient documentation

## 2017-11-24 DIAGNOSIS — I10 Essential (primary) hypertension: Secondary | ICD-10-CM | POA: Insufficient documentation

## 2017-11-24 DIAGNOSIS — Y9389 Activity, other specified: Secondary | ICD-10-CM | POA: Diagnosis not present

## 2017-11-24 DIAGNOSIS — Y998 Other external cause status: Secondary | ICD-10-CM | POA: Diagnosis not present

## 2017-11-24 MED ORDER — ONDANSETRON HCL 4 MG/2ML IJ SOLN
INTRAMUSCULAR | Status: AC
  Administered 2017-11-24: 4 mg
  Filled 2017-11-24: qty 2

## 2017-11-24 MED ORDER — FENTANYL CITRATE (PF) 100 MCG/2ML IJ SOLN
25.0000 ug | Freq: Once | INTRAMUSCULAR | Status: AC
  Administered 2017-11-24: 25 ug via INTRAVENOUS
  Filled 2017-11-24: qty 2

## 2017-11-24 MED ORDER — OXYCODONE HCL 5 MG PO CAPS: 5.0000 mg | ORAL_CAPSULE | Freq: Four times a day (QID) | ORAL | 0 refills | Status: DC | PRN

## 2017-11-24 MED ORDER — FENTANYL CITRATE (PF) 100 MCG/2ML IJ SOLN
25.0000 ug | Freq: Once | INTRAMUSCULAR | Status: AC
  Administered 2017-11-24: 25 ug via INTRAVENOUS

## 2017-11-24 NOTE — ED Notes (Signed)
Pt discharged home after verbalizing understanding of discharge instructions; nad noted. 

## 2017-11-24 NOTE — Discharge Instructions (Signed)
You have an appointment with Dr. Allena KatzPatel on Tuesday 12/07/2017 at 8:30 AM. [facility]. You may call the office if the appointment time needs to be changed. Twice to area to reduce swelling and decrease pain as needed. Wear sling for support until seen by the orthopedist. Take pain medication only as directed. This medication can cause drowsiness increase your risk for falling.

## 2017-11-24 NOTE — ED Provider Notes (Signed)
[facility] Emergency Department Provider Note  ____________________________________________   First MD Initiated Contact with Patient 11/24/17 1312     (approximate)  I have reviewed the triage vital signs and the nursing notes.   HISTORY  Chief Complaint Shoulder Pain   HPI Tamara Tamara [name] is a 71 y.o. female is here via EMS from home after falling. Patient states that this was a mechanical fall and that she landed on her right shoulder She states that there was no loss of consciousness. She denies any visual changes or headache. Patient has continued to have right arm pain with increased pain with range of motion.she denies any other injuries with this fall. Husband states that patient has had 3 surgeries on her back and tends to be unstable sometimes with walking.   Past Medical History:  Diagnosis Date  . Depression   . Diabetes mellitus without complication (HCC)   . Hypertension     There are no active problems to display for this patient.   Past Surgical History:  Procedure Laterality Date  . ACHILLES TENDON REPAIR    . BACK SURGERY    . HERNIA REPAIR    . SHOULDER SURGERY      Prior to Admission medications   Medication Sig Start Date End Date Taking? Authorizing Provider  acetaminophen (TYLENOL) 325 MG tablet Take 650 mg by mouth every 6 (six) hours as needed.    [provider]  atenolol (TENORMIN) 25 MG tablet Take 25 mg by mouth daily.    [provider]  buPROPion (WELLBUTRIN SR) 200 MG 12 hr tablet Take 200 mg by mouth 2 (two) times daily.    [provider]  cyclobenzaprine (FLEXERIL) 10 MG tablet Take 10 mg by mouth 3 (three) times daily as needed for muscle spasms.    [provider]  DULoxetine (CYMBALTA) 60 MG capsule Take 60 mg by mouth daily.    [provider]  insulin aspart protamine- aspart (NOVOLOG MIX 70/30) (70-30) 100 UNIT/ML injection Inject 42 Units into the skin daily  with breakfast.    [provider]  metFORMIN (GLUMETZA) 1000 MG (MOD) 24 hr tablet Take 1,000 mg by mouth 2 (two) times daily with a meal.    [provider]  metoprolol succinate (TOPROL-XL) 25 MG 24 hr tablet Take 25 mg by mouth daily.    [provider]  mupirocin ointment (BACTROBAN) 2 % Apply three times a Tamara [name] for 5 days. 05/31/16   Renford Dills, NP  oxycodone (OXY-IR) 5 MG capsule Take 1 capsule (5 mg total) by mouth every 6 (six) hours as needed for pain. 11/24/17   Tommi Rumps, PA-C  valsartan (DIOVAN) 80 MG tablet Take 40 mg by mouth daily.     [provider]    Allergies Patient has no known allergies.  Family History  Problem Relation Age of Onset  . Cancer Mother     Social History Social History   Tobacco Use  . Smoking status: Never Smoker  . Smokeless tobacco: Never Used  Substance Use Topics  . Alcohol use: No  . Drug use: No    Review of Systems Constitutional: No fever/chills Eyes: No visual changes. ENT: no trauma Cardiovascular: Denies chest pain. Respiratory: Denies shortness of breath. Gastrointestinal: No abdominal pain.  No nausea, no vomiting.   Musculoskeletal: positive for right shoulder pain. Skin: Negative for rash. Neurological: Negative for headaches, focal weakness or numbness. Psychiatric:positive for depression. Endocrine:positive for diabetes mellitus  ____________________________________________   PHYSICAL EXAM:  VITAL SIGNS: ED Triage Vitals  Enc Vitals Group     BP 11/24/17 1303 127/78     Pulse Rate 11/24/17 1303 68     Resp 11/24/17 1303 18     Temp 11/24/17 1303 97.6 F (36.4 C)     Temp Source 11/24/17 1303 Oral     SpO2 11/24/17 1303 98 %     Weight 11/24/17 1304 195 lb (88.5 kg)     Height 11/24/17 1304 5\' 7"  (1.702 m)     Head Circumference --      Peak Flow --      Pain Score --      Pain Loc --      Pain Edu? --      Excl. in GC? --     Constitutional: Alert and  oriented. Well appearing and in no acute distress. Eyes: Conjunctivae are normal.  Head: Atraumatic. Neck: No stridor.nontender cervical spine to palpation posteriorly. Cardiovascular: Normal rate, regular rhythm. Grossly normal heart sounds.  Good peripheral circulation. Respiratory: Normal respiratory effort.  No retractions. Lungs CTAB. Gastrointestinal: Soft and nontender. No distention.  Musculoskeletal: on examination of the right shoulder there is moderate soft tissue swelling at the proximal humerus. Range of motion is restricted secondary to patient's pain.No ecchymosis or abrasions are noted. Patient motor sensory function is intact distal to her injury. Neurologic:  Normal speech and language. No gross focal neurologic deficits are appreciated.  Skin:  Skin is warm, dry and intact. no ecchymosis, erythema or abrasions are noted. Psychiatric: Mood and affect are normal. Speech and behavior are normal.  ____________________________________________   LABS (all labs ordered are listed, but only abnormal results are displayed)  Labs Reviewed - No data to display  RADIOLOGY  Dg Shoulder Right  Result Date: 11/24/2017 CLINICAL DATA:  Recent fall, right shoulder injury, pain EXAM: RIGHT SHOULDER - 2+ VIEW COMPARISON:  11/24/2017 FINDINGS: There is re- demonstration of a comminuted impacted fracture of the proximal right humerus surgical neck. Bones are osteopenic. [facility]. Remote rotator cuff repair noted. No associated subluxation or dislocation. Diffuse soft tissue swelling present. IMPRESSION: Comminuted and impacted right proximal humerus surgical neck fracture. Stable appearance. Electronically Signed   By: Judie PetitM.  Shick M.D.   On: 11/24/2017 14:42   Ct Shoulder Right Wo Contrast  Result Date: 11/24/2017 CLINICAL DATA:  Status post fall.  Right shoulder pain. EXAM: CT OF THE UPPER RIGHT EXTREMITY WITHOUT CONTRAST TECHNIQUE: Multidetector CT imaging of the  upper right extremity was performed according to the standard protocol. COMPARISON:  None. FINDINGS: Bones/Joint/Cartilage Comminuted and mildly impacted fracture of the surgical neck of the right proximal humerus without angulation. Fracture cleft extends to the greater tuberosity. Surgical interference screw from prior rotator cuff repair is in the greater tuberosity. No articular surface involvement. Major medial fracture fragment is anteromedially displaced by 5 mm. No other fracture dislocation. Mild osteoarthritis of the glenohumeral joint. Moderate arthropathy of the acromioclavicular joint with subacromial decompression. Type II acromion. No significant joint effusion. Ligaments Ligaments are suboptimally evaluated by CT. Muscles and Tendons Muscles are normal.  No muscle atrophy. Soft tissue No fluid collection or hematoma.  No soft tissue mass. IMPRESSION: 1. Comminuted and mildly impacted fracture of the surgical neck of the right proximal humerus without angulation with a fracture cleft extending to the greater tuberosity. Electronically Signed   By: Elige KoHetal  Patel   On: 11/24/2017 14:36   Dg Humerus Right  Result Date: 11/24/2017 CLINICAL DATA:  Right proximal humerus pain after fall today. EXAM: RIGHT HUMERUS - 2+ VIEW COMPARISON:  None. FINDINGS: Moderately displaced and comminuted fracture is seen involving the proximal right humeral head and neck. No soft tissue abnormality is noted. IMPRESSION: Moderately displaced and comminuted proximal right humeral head and neck fracture. Electronically Signed   By: Lupita RaiderJames  Green Jr, M.D.   On: 11/24/2017 13:41    ____________________________________________   PROCEDURES  Procedure(s) performed: None  Procedures  Critical Care performed: No  ____________________________________________   INITIAL IMPRESSION / ASSESSMENT AND PLAN / ED COURSE Dr. Adrian ProwsSonny Patel who is the orthopedist on call today was notified of patient's fracture. He reviewed  x-rays and CT scan. Patient was given an appointment in the medicine office for 12/07/2017 at 8:30 AM.Patient was placed in a sling per Dr. Allena KatzPatel. Patient was given fentanyl in the department for pain management. Patient states she has taken oxycodone 5 mg in the past without any difficulties. She is instructed to ice area as needed for swelling and that pain medication could cause drowsiness increase her risk for falling.  ____________________________________________   FINAL CLINICAL IMPRESSION(S) / ED DIAGNOSES  Final diagnoses:  Closed displaced fracture of surgical neck of right humerus, unspecified fracture morphology, initial encounter     ED Discharge Orders        Ordered    oxycodone (OXY-IR) 5 MG capsule  Every 6 hours PRN     11/24/17 1521       Note:  This document was prepared using Dragon voice recognition software and may include unintentional dictation errors.    Tommi RumpsSummers, Lenville Hibberd L, PA-C 11/24/17 1602    Minna AntisPaduchowski, Kevin, MD 11/24/17 [phone]

## 2017-11-24 NOTE — ED Triage Notes (Signed)
Patient presents to ED via [facility] from home post fall. Patient denies dizziness or syncope. Denies hitting head. Patient states she landed on her right shoulder, c/o shoulder pain.

## 2017-12-17 ENCOUNTER — Emergency Department: Payer: Medicare Other

## 2017-12-17 ENCOUNTER — Emergency Department
Admission: EM | Admit: 2017-12-17 | Discharge: 2017-12-17 | Disposition: A | Discharge status: Home / Self Care | Payer: Medicare Other | Attending: Emergency Medicine | Admitting: Emergency Medicine

## 2017-12-17 DIAGNOSIS — Z794 Long term (current) use of insulin: Secondary | ICD-10-CM | POA: Diagnosis not present

## 2017-12-17 DIAGNOSIS — E119 Type 2 diabetes mellitus without complications: Secondary | ICD-10-CM | POA: Diagnosis not present

## 2017-12-17 DIAGNOSIS — R2681 Unsteadiness on feet: Secondary | ICD-10-CM

## 2017-12-17 DIAGNOSIS — E860 Dehydration: Secondary | ICD-10-CM | POA: Diagnosis not present

## 2017-12-17 DIAGNOSIS — Z79899 Other long term (current) drug therapy: Secondary | ICD-10-CM | POA: Diagnosis not present

## 2017-12-17 DIAGNOSIS — R4182 Altered mental status, unspecified: Secondary | ICD-10-CM | POA: Diagnosis present

## 2017-12-17 DIAGNOSIS — I10 Essential (primary) hypertension: Secondary | ICD-10-CM | POA: Insufficient documentation

## 2017-12-17 LAB — CBC
HCT: 41.9 % (ref 35.0–47.0)
Hemoglobin: 13.8 g/dL (ref 12.0–16.0)
MCH: 27.6 pg (ref 26.0–34.0)
MCHC: 33 g/dL (ref 32.0–36.0)
MCV: 83.7 fL (ref 80.0–100.0)
PLATELETS: 532 10*3/uL — AB (ref 150–440)
RBC: 5.01 MIL/uL (ref 3.80–5.20)
RDW: 14.6 % — ABNORMAL HIGH (ref 11.5–14.5)
WBC: 10 10*3/uL (ref 3.6–11.0)

## 2017-12-17 LAB — COMPREHENSIVE METABOLIC PANEL
ALBUMIN: 4.4 g/dL (ref 3.5–5.0)
ALT: 22 U/L (ref 14–54)
ANION GAP: 15 (ref 5–15)
AST: 42 U/L — ABNORMAL HIGH (ref 15–41)
Alkaline Phosphatase: 151 U/L — ABNORMAL HIGH (ref 38–126)
BUN: 33 mg/dL — ABNORMAL HIGH (ref 6–20)
CHLORIDE: 102 mmol/L (ref 101–111)
CO2: 20 mmol/L — AB (ref 22–32)
Calcium: 9.9 mg/dL (ref 8.9–10.3)
Creatinine, Ser: 1.31 mg/dL — ABNORMAL HIGH (ref 0.44–1.00)
GFR calc Af Amer: 47 mL/min — ABNORMAL LOW (ref >60)
GFR calc non Af Amer: 40 mL/min — ABNORMAL LOW (ref >60)
Glucose, Bld: 145 mg/dL — ABNORMAL HIGH (ref 65–99)
Potassium: 4.3 mmol/L (ref 3.5–5.1)
SODIUM: 137 mmol/L (ref 135–145)
Total Bilirubin: 1.1 mg/dL (ref 0.3–1.2)
Total Protein: 8.6 g/dL — ABNORMAL HIGH (ref 6.5–8.1)

## 2017-12-17 LAB — URINALYSIS, COMPLETE (UACMP) WITH MICROSCOPIC
BILIRUBIN URINE: NEGATIVE
Bacteria, UA: NONE SEEN
GLUCOSE, UA: NEGATIVE mg/dL
HGB URINE DIPSTICK: NEGATIVE
Ketones, ur: NEGATIVE mg/dL
LEUKOCYTES UA: NEGATIVE
NITRITE: NEGATIVE
PH: 5 (ref 5.0–8.0)
Protein, ur: NEGATIVE mg/dL
Specific Gravity, Urine: 1.021 (ref 1.005–1.030)

## 2017-12-17 LAB — TROPONIN I: Troponin I: <0.03 ng/mL (ref <0.03)

## 2017-12-17 MED ORDER — SODIUM CHLORIDE 0.9 % IV BOLUS (SEPSIS)
1000.0000 mL | Freq: Once | INTRAVENOUS | Status: AC
  Administered 2017-12-17: 1000 mL via INTRAVENOUS

## 2017-12-17 NOTE — ED Notes (Signed)
Checked on pt, resting comfortably. Put in diet order.

## 2017-12-17 NOTE — ED Provider Notes (Addendum)
[facility] Emergency Department Provider Note  Time seen: 8:48 AM  I have reviewed the triage vital signs and the nursing notes.   HISTORY  Chief Complaint Altered Mental Status    HPI Tamara [name] is a 72 y.o. female with a past medical history of diabetes, depression, hypertension presents to the emergency department for confusion.  According to EMS they were called by the patient's husband saying that she was acting very confused this morning.  Upon arrival patient does have a very dry appearing mouth with cracked and dry lips, is having some difficulty speaking but states is because her mouth is so dry.  Patient has occasional jerking movements but states this is been ongoing for 6 months and she sees a neurologist but they are not sure why this is happening.  Patient is alert and oriented at this time, was able to tell me the year is 2019.  States she has felt some confusion this morning.  Denies any chest pain, recent cough or congestion.  Denies abdominal pain nausea vomiting diarrhea or dysuria.  Denies current headache.  Patient does wear a sling to the right upper extremity for a shoulder injury but cannot tell me exactly when the shoulder injury occurred.  Per EMS patient stated she was 72 years old, however upon arrival she appears to be answering questions more accurately.  Currently the patient is calm, cooperative, lying in bed in no distress.  Past Medical History:  Diagnosis Date  . Depression   . Diabetes mellitus without complication (HCC)   . Hypertension     There are no active problems to display for this patient.   Past Surgical History:  Procedure Laterality Date  . ACHILLES TENDON REPAIR    . BACK SURGERY    . HERNIA REPAIR    . SHOULDER SURGERY      Prior to Admission medications   Medication Sig Start Date End Date Taking? Authorizing Provider  acetaminophen (TYLENOL) 325 MG tablet Take 650 mg by mouth every 6 (six) hours as  needed.    [provider]  atenolol (TENORMIN) 25 MG tablet Take 25 mg by mouth daily.    [provider]  buPROPion (WELLBUTRIN SR) 200 MG 12 hr tablet Take 200 mg by mouth 2 (two) times daily.    [provider]  cyclobenzaprine (FLEXERIL) 10 MG tablet Take 10 mg by mouth 3 (three) times daily as needed for muscle spasms.    [provider]  DULoxetine (CYMBALTA) 60 MG capsule Take 60 mg by mouth daily.    [provider]  insulin aspart protamine- aspart (NOVOLOG MIX 70/30) (70-30) 100 UNIT/ML injection Inject 42 Units into the skin daily with breakfast.    [provider]  metFORMIN (GLUMETZA) 1000 MG (MOD) 24 hr tablet Take 1,000 mg by mouth 2 (two) times daily with a meal.    [provider]  metoprolol succinate (TOPROL-XL) 25 MG 24 hr tablet Take 25 mg by mouth daily.    [provider]  mupirocin ointment (BACTROBAN) 2 % Apply three times a day for 5 days. 05/31/16   Renford Dills, NP  oxycodone (OXY-IR) 5 MG capsule Take 1 capsule (5 mg total) by mouth every 6 (six) hours as needed for pain. 11/24/17   Tommi Rumps, PA-C  valsartan (DIOVAN) 80 MG tablet Take 40 mg by mouth daily.     [provider]    No Known Allergies  Family History  Problem  Relation Age of Onset  . Cancer Mother     Social History Social History   Tobacco Use  . Smoking status: Never Smoker  . Smokeless tobacco: Never Used  Substance Use Topics  . Alcohol use: No  . Drug use: No    Review of Systems Constitutional: Negative for fever.  Patient is aware of some confusion this morning. Eyes: Negative for visual complaints ENT: Negative for recent illness/congestion Cardiovascular: Negative for chest pain. Respiratory: Negative for shortness of breath. Gastrointestinal: Negative for abdominal pain, vomiting and diarrhea. Genitourinary: Negative for dysuria or malodorous urine Musculoskeletal: Right shoulder pain,  wears a sling to the right upper extremity. Skin: Negative for skin complaints  Neurological: Negative for headache All other ROS negative  ____________________________________________   PHYSICAL EXAM:  VITAL SIGNS: ED Triage Vitals [12/17/17 0846]  Enc Vitals Group     BP      Pulse      Resp      Temp      Temp src      SpO2      Weight 195 lb (88.5 kg)     Height 5\' 7"  (1.702 m)     Head Circumference      Peak Flow      Pain Score      Pain Loc      Pain Edu?      Excl. in GC?     Constitutional: Alert and oriented. Well appearing and in no distress. Eyes: Normal exam ENT   Head: Normocephalic and atraumatic.   Nose: No congestion   Mouth/Throat: Very dry appearing mucous membranes with cracked and dried lips. Cardiovascular: Normal rate, regular rhythm around 100 bpm with no obvious murmur. Respiratory: Normal respiratory effort without tachypnea nor retractions. Breath sounds are clear Gastrointestinal: Soft and nontender. No distention.  Musculoskeletal: Nontender with normal range of motion in all extremities. No lower extremity tenderness or edema. Neurologic:  Normal speech and language. No gross focal neurologic deficits Skin:  Skin is warm, dry and intact.  Psychiatric: Mood and affect are normal.  ____________________________________________    EKG  EKG reviewed and interpreted by myself shows sinus rhythm at 98 bpm with a narrow QRS, normal axis, normal intervals, nonspecific but no concerning ST changes.  ____________________________________________    RADIOLOGY  CT shows no acute abnormality. X-ray of the chest is negative. X-ray of the right humerus is unchanged.  ____________________________________________   INITIAL IMPRESSION / ASSESSMENT AND PLAN / ED COURSE  Pertinent labs & imaging results that were available during my care of the patient were reviewed by me and considered in my medical decision making (see chart for  details).  Patient presents to the emergency department today for confusion, coming from home.  Patient states she is aware of some confusion but currently she is oriented x4.  Her responses are somewhat slowed although not significantly.  Differential at this time is quite broad but would include electrolyte or metabolic abnormality, infectious etiology, CVA, dehydration.  We will check labs, urinalysis, chest x-ray and CT scan of the head.  We will IV hydrate while awaiting lab results given her dry mucous membranes.  We will discuss with the husband once he arrives for further details as to her confusion this morning.  Record review shows a right humerus fracture 11/24/17.  Patient was prescribed oxycodone at this time.  In care everywhere the patient is continued to be prescribed oxycodone by Dr. Allena Katz of orthopedics, with a current  active prescription for 5 mg tablets every 4 hours as needed for pain.  Patient's confusion could be medication induced as well.   Patient's husband is now here.  States the patient has been having increasing falls over the past 6 months approximately 8 falls including one several days ago.  He states she has been confused and talking in her sleep especially at nighttime but seems to clear and become cohesive during the daytime.  He states this has happened multiple times in the past especially after a major surgeries with the patient, but states he thinks this time is somewhat worse.  The major thing that made him want to get her evaluated today was because she woke up last night sweating which he says is atypical.  He was concerned over a possible infection.  He states the patient's jerking movements are normal for her and they are seeing neurology.  He says he wanted to be safe so he brought her into be evaluated to make sure she did not have a urinary tract infection or some other cause of her worsening confusion at nighttime.  Currently the patient is like I said earlier  alert and oriented, husband states she seems normal currently.  He says this is fairly typical where she will be confused and talking in her sleep but then during the daytime she is back to normal.   Patient's workup is been largely nonrevealing.  Chemistry shows mild dehydration but otherwise normal.  Troponin negative, urinalysis normal.  X-ray is normal, CT scan of the head is negative.  X-ray of the right humerus is unchanged.  Overall the patient appears well.  She is sleeping comfortably but easily awakens and answers questions appropriately.  We will continue with IV hydration however given her normal workup I anticipate likely discharge home with her husband.  Had the patient seen by clinical social worker, unfortunately patient does not meet requirements at this time to go to rehab facility.  The husband is initiating home health care, I have added a face to face form for home physical therapy, with the help of the case manager.  Husband is agreeable to this plan of care.  I discussed with patient reducing or stopping oxycodone, as this could very likely be playing a role in the patient's unstable gait and intermittent confusion.  Husband agreeable to plan.  ____________________________________________   FINAL CLINICAL IMPRESSION(S) / ED DIAGNOSES  Unstable gait Dehydration Falls   Minna AntisPaduchowski, Terreon Ekholm, MD 12/17/17 1328    Minna AntisPaduchowski, Everlena Mackley, MD 12/17/17 1329

## 2017-12-17 NOTE — ED Notes (Signed)
Pt transported to CT ?

## 2017-12-17 NOTE — Clinical Social Work Note (Addendum)
Clinical Social Work Assessment  Patient Details  Name: Tamara [name] MRN: [id number] Date of Birth: 12-28-1945  Date of referral:  12/17/17               Reason for consult:  Facility Placement, Family Concerns, Discharge Planning                Permission sought to share information with:  Case Manager, Family Supports Permission granted to share information::  Yes, Verbal Permission Granted  Name::        Agency::     Relationship::  Husband at bedside in ED  Contact Information:     Housing/Transportation Living arrangements for the past 2 months:  [place] of Information:  Medical Team, Spouse Patient Interpreter Needed:  None Criminal Activity/Legal Involvement Pertinent to Current Situation/Hospitalization:  No - Comment as needed Significant Relationships:  Spouse Lives with:  Spouse Do you feel safe going back to the place where you live?  Yes(requesting SNF if possible for rehab) Need for family participation in patient care:  Yes (Comment)(Patient sleeping, confused at times)  Care giving concerns:  Patient arrives to hospital after husband reports she has been confused, unable to to get up out of bed and having increasing falls over the past 6 months approximately 8 falls including one several days ago.  He states she has been confused and talking in her sleep especially at nighttime but seems to clear and become cohesive during the daytime.  He states this has happened multiple times in the past especially after a major surgeries with the patient, but states he thinks this time is somewhat worse.  The major thing that made him want to get her evaluated today was because she woke up last night sweating which he says is atypical.  He was concerned over a possible infection.  He states the patient's jerking movements are normal for her and they are seeing neurology.  He says he wanted to be safe so he brought her into be evaluated to make sure she did not  have a urinary tract infection or some other cause of her worsening confusion at nighttime.  Currently the patient is like I said earlier alert and oriented, husband states she seems normal currently.  He says this is fairly typical where she will be confused and talking in her sleep but then during the daytime she is back to normal.   Husband reports patient had surgery on back (4th surgery) in march 2018 and she was placed for almost a month at [facility].  He is requesting possible placement at discharge to enhance safety, improve function and mobility with PT.   Social Worker assessment / plan:  LCSW met with husband in the ED and discussed case with attending. Patient at this time reports he is the sole caregiver for patient and was referred to a company called [facility] [employer]. He reports he spoke to them on 12/16/17 to begin services and set up a schedule.  He reports he needs more help at home in effort to get her out of bed and care for her as he is unable due to her current presentation.  Patient at this time does not meet criteria for medical admission per discussion with EDP.  LCSW explained Medicare regulations for payment regarding SNF and also private pay options if Husband willing.  Husband at this time reports he cannot pay privately, but would be open to PT/OT coming into the home to assist along  with PCS services. Call placed to CM for additional support and information.   Husband reports he has all necessary DME at home from her pervious surgeries.    Discussed with MD and also husband medication and pain medication may be interfering with care and causing more confusion.  Husband agreeable and reports to decrease medications. He plans to follow up with orthopedic next week.   Plan: Home with husband CM will be coming to see husband and set up home health and complete orders. No SNF placement at this time.  MD aware of plan and barriers. Clinical Social Worker signing off at  this time.   Employment status:  Retired Forensic scientist:  Medicare PT Recommendations:  Not assessed at this time Information / Referral to community resources:  [place], Other (Comment Required)(CM follow up with possible home health)  Patient/Family's Response to care:  Husband voices understanding. He reports he is going to look at his LTC policy he took out in the 90s and see if they can help with placement at facility if needed.  Patient/Family's Understanding of and Emotional Response to Diagnosis, Current Treatment, and Prognosis:  Husband understands reasons for no medical admission and criteria by medicare guidelines for placement.  He is amendable to other options with home health and PCS services at this time.   Emotional Assessment Appearance:  Appears stated age Attitude/Demeanor/Rapport:    Affect (typically observed):  Other(Sleeping, snoring loudly) Orientation:  Oriented to Self, Oriented to Place Alcohol / Substance use:  Not Applicable Psych involvement (Current and /or in the community):  Yes (Comment)(Depression/ Wellbutrin in outpatient)  Discharge Needs  Concerns to be addressed:  Care Coordination, Home Safety Concerns, Basic Needs Readmission within the last 30 days:  No Current discharge risk:  Physical Impairment Barriers to Discharge:  Barriers Resolved   [place], LCSW 12/17/2017, 12:19 PM

## 2017-12-17 NOTE — ED Notes (Signed)
Pt assited to bathroom. Pt wiped down with bath clothes, sent home in hospital blue scrubs. Discharge went over with husband

## 2017-12-17 NOTE — Care Management Note (Signed)
Case Management Note  Patient Details  Name: Tamara BertholdRoberta [name] MRN: [id number] Date of Birth: August 15, 1946  Subjective/Objective:          Spoke to spouse at bedside and verified address, and phone number. [facility] and [facility] Agar[facility]ie Ray accepted the referral for home PT.MD to complete the face to face.          Action/Plan:   Expected Discharge Date:                  Expected Discharge Plan:     In-House Referral:     Discharge planning Services     Post Acute Care Choice:    Choice offered to:     DME Arranged:    DME Agency:     HH Arranged:    HH Agency:     Status of Service:     If discussed at [employer] Length of Stay Meetings, dates discussed:    Additional Comments:  Tamara BueCheryl Tag Wurtz, RN 12/17/2017, 12:30 PM

## 2017-12-17 NOTE — ED Triage Notes (Signed)
Pt came to ED via EMS from home. Husband called EMS due to night sweats and altered mental status. PT presents with right arm in sling from previous shoulder injury.

## 2017-12-17 NOTE — ED Notes (Signed)
MD Paduchowski at bedside talking to husband.

## 2017-12-17 NOTE — ED Notes (Signed)
Pt given meal tray.

## 2018-06-03 ENCOUNTER — Other Ambulatory Visit: Payer: Self-pay

## 2018-06-03 ENCOUNTER — Emergency Department: Payer: Medicare Other

## 2018-06-03 ENCOUNTER — Emergency Department
Admission: EM | Admit: 2018-06-03 | Discharge: 2018-06-03 | Disposition: A | Discharge status: Home / Self Care | Payer: Medicare Other | Attending: Emergency Medicine | Admitting: Emergency Medicine

## 2018-06-03 DIAGNOSIS — R42 Dizziness and giddiness: Secondary | ICD-10-CM | POA: Insufficient documentation

## 2018-06-03 DIAGNOSIS — Y92019 Unspecified place in single-family (private) house as the place of occurrence of the external cause: Secondary | ICD-10-CM | POA: Diagnosis not present

## 2018-06-03 DIAGNOSIS — Y999 Unspecified external cause status: Secondary | ICD-10-CM | POA: Insufficient documentation

## 2018-06-03 DIAGNOSIS — I10 Essential (primary) hypertension: Secondary | ICD-10-CM | POA: Insufficient documentation

## 2018-06-03 DIAGNOSIS — Y939 Activity, unspecified: Secondary | ICD-10-CM | POA: Insufficient documentation

## 2018-06-03 DIAGNOSIS — Z79899 Other long term (current) drug therapy: Secondary | ICD-10-CM | POA: Insufficient documentation

## 2018-06-03 DIAGNOSIS — R410 Disorientation, unspecified: Secondary | ICD-10-CM | POA: Insufficient documentation

## 2018-06-03 DIAGNOSIS — W010XXA Fall on same level from slipping, tripping and stumbling without subsequent striking against object, initial encounter: Secondary | ICD-10-CM | POA: Diagnosis not present

## 2018-06-03 DIAGNOSIS — E119 Type 2 diabetes mellitus without complications: Secondary | ICD-10-CM | POA: Insufficient documentation

## 2018-06-03 DIAGNOSIS — S0990XA Unspecified injury of head, initial encounter: Secondary | ICD-10-CM | POA: Diagnosis present

## 2018-06-03 DIAGNOSIS — R2681 Unsteadiness on feet: Secondary | ICD-10-CM | POA: Diagnosis not present

## 2018-06-03 DIAGNOSIS — Z794 Long term (current) use of insulin: Secondary | ICD-10-CM | POA: Insufficient documentation

## 2018-06-03 LAB — CBC WITH DIFFERENTIAL/PLATELET
Basophils Absolute: 0 10*3/uL (ref 0–0.1)
Basophils Relative: 0 %
EOS ABS: 0 10*3/uL (ref 0–0.7)
EOS PCT: 0 %
HCT: 39.9 % (ref 35.0–47.0)
HEMOGLOBIN: 13.4 g/dL (ref 12.0–16.0)
LYMPHS ABS: 0.9 10*3/uL — AB (ref 1.0–3.6)
Lymphocytes Relative: 6 %
MCH: 28.3 pg (ref 26.0–34.0)
MCHC: 33.5 g/dL (ref 32.0–36.0)
MCV: 84.4 fL (ref 80.0–100.0)
MONOS PCT: 3 %
Monocytes Absolute: 0.5 10*3/uL (ref 0.2–0.9)
NEUTROS PCT: 91 %
Neutro Abs: 14.3 10*3/uL — ABNORMAL HIGH (ref 1.4–6.5)
Platelets: 346 10*3/uL (ref 150–440)
RBC: 4.73 MIL/uL (ref 3.80–5.20)
RDW: 14.4 % (ref 11.5–14.5)
WBC: 15.8 10*3/uL — ABNORMAL HIGH (ref 3.6–11.0)

## 2018-06-03 LAB — BASIC METABOLIC PANEL
Anion gap: 10 (ref 5–15)
BUN: 23 mg/dL (ref 8–23)
CHLORIDE: 101 mmol/L (ref 98–111)
CO2: 24 mmol/L (ref 22–32)
CREATININE: 1.07 mg/dL — AB (ref 0.44–1.00)
Calcium: 9.5 mg/dL (ref 8.9–10.3)
GFR calc Af Amer: 59 mL/min — ABNORMAL LOW (ref >60)
GFR calc non Af Amer: 51 mL/min — ABNORMAL LOW (ref >60)
Glucose, Bld: 157 mg/dL — ABNORMAL HIGH (ref 70–99)
Potassium: 4.4 mmol/L (ref 3.5–5.1)
Sodium: 135 mmol/L (ref 135–145)

## 2018-06-03 NOTE — ED Provider Notes (Signed)
[facility] Emergency Department Provider Note  ____________________________________________  Time seen: Approximately 8:19 PM  I have reviewed the triage vital signs and the nursing notes.   HISTORY  Chief Complaint Fall    HPI Tamara [name] is a 72 y.o. female with a history of diabetes hypertension and depression, recently started on new medication (Rexulti) who comes to the ED due to a fall at home.  She notes that over the past 2 days since starting the new medicine she has had episodic confusion and feeling unstable on her feet.  She denies any current complaints.  She does believe she hit her head when she fell, but was a fall from standing, she did not hit her head on anything other than the floor.  No headache vision changes neck pain paresthesias or weakness.  No back pain.  No other complaints.  No chest pain or shortness of breath.  Otherwise she was in her usual state of health without prodromal symptoms.      Past Medical History:  Diagnosis Date  . Depression   . Diabetes mellitus without complication (HCC)   . Hypertension      There are no active problems to display for this patient.    Past Surgical History:  Procedure Laterality Date  . ACHILLES TENDON REPAIR    . BACK SURGERY    . HERNIA REPAIR    . SHOULDER SURGERY       Prior to Admission medications   Medication Sig Start Date End Date Taking? Authorizing Provider  acetaminophen (TYLENOL) 325 MG tablet Take 650 mg by mouth every 6 (six) hours as needed.    [provider]  atorvastatin (LIPITOR) 10 MG tablet Take 10 mg by mouth daily.    [provider]  BuPROPion HCl ER, XL, 450 MG TB24 Take 1 tablet by mouth daily.    [provider]  cyclobenzaprine (FLEXERIL) 10 MG tablet Take 10 mg by mouth 3 (three) times daily as needed for muscle spasms.    [provider]  insulin aspart protamine- aspart (NOVOLOG MIX 70/30) (70-30) 100 UNIT/ML  injection Inject 32 Units into the skin 2 (two) times daily with a meal.     [provider]  levothyroxine (SYNTHROID, LEVOTHROID) 125 MCG tablet Take 125 mcg by mouth daily before breakfast.    [provider]  metFORMIN (GLUCOPHAGE-XR) 500 MG 24 hr tablet Take 1 tablet by mouth 2 (two) times daily. 12/09/17   [provider]  mupirocin ointment (BACTROBAN) 2 % Apply three times a day for 5 days. Patient not taking: Reported on 12/17/2017 05/31/16   Renford Dills, NP  oxycodone (OXY-IR) 5 MG capsule Take 1 capsule (5 mg total) by mouth every 6 (six) hours as needed for pain. 11/24/17   Tommi Rumps, PA-C  propranolol (INDERAL) 40 MG tablet Take 40 mg by mouth 2 (two) times daily.    [provider]  TRULICITY 1.5 MG/0.5ML SOPN Inject 1.5 mg into the skin once a week. 10/28/17   [provider]  valsartan (DIOVAN) 80 MG tablet Take 40 mg by mouth daily.     [provider]     Allergies Ace inhibitors   Family History  Problem Relation Age of Onset  . Cancer Mother     Social History Social History   Tobacco Use  . Smoking status: Never Smoker  . Smokeless tobacco: Never Used  Substance Use Topics  . Alcohol use: No  .  Drug use: No    Review of Systems  Constitutional:   No fever or chills.  ENT:   No sore throat. No rhinorrhea. Cardiovascular:   No chest pain or syncope. Respiratory:   No dyspnea or cough. Gastrointestinal:   Negative for abdominal pain, vomiting and diarrhea.  Musculoskeletal:   Negative for focal pain or swelling All other systems reviewed and are negative except as documented above in ROS and HPI.  ____________________________________________   PHYSICAL EXAM:  VITAL SIGNS: ED Triage Vitals  Enc Vitals Group     BP 06/03/18 1826 (!) 149/83     Pulse Rate 06/03/18 1826 76     Resp 06/03/18 1826 16     Temp 06/03/18 1826 98.2 F (36.8 C)     Temp Source 06/03/18 1826 Oral     SpO2  06/03/18 1826 94 %     Weight 06/03/18 1827 184 lb (83.5 kg)     Height 06/03/18 1827 5\' 6"  (1.676 m)     Head Circumference --      Peak Flow --      Pain Score 06/03/18 1826 1     Pain Loc --      Pain Edu? --      Excl. in GC? --     Vital signs reviewed, nursing assessments reviewed.   Constitutional:   Alert and oriented. Non-toxic appearance. Eyes:   Conjunctivae are normal. EOMI. PERRL. ENT      Head:   Normocephalic and atraumatic.      Nose:   No congestion/rhinnorhea.       Mouth/Throat:   MMM, no pharyngeal erythema. No peritonsillar mass.       Neck:   No meningismus. Full ROM. Hematological/Lymphatic/Immunilogical:   No cervical lymphadenopathy. Cardiovascular:   RRR. Symmetric bilateral radial and DP pulses.  No murmurs.  Respiratory:   Normal respiratory effort without tachypnea/retractions. Breath sounds are clear and equal bilaterally. No wheezes/rales/rhonchi. Gastrointestinal:   Soft and nontender. Non distended. There is no CVA tenderness.  No rebound, rigidity, or guarding. Musculoskeletal:   Normal range of motion in all extremities. No joint effusions.  No lower extremity tenderness.  No edema. Neurologic:   Normal speech and language.  Motor grossly intact. Normal cerebellar testing No acute focal neurologic deficits are appreciated.  Skin:    Skin is warm, dry and intact. No rash noted.  No petechiae, purpura, or bullae.  ____________________________________________    LABS (pertinent positives/negatives) (all labs ordered are listed, but only abnormal results are displayed) Labs Reviewed  BASIC METABOLIC PANEL - Abnormal; Notable for the following components:      Result Value   Glucose, Bld 157 (*)    Creatinine, Ser 1.07 (*)    GFR calc non Af Amer 51 (*)    GFR calc Af Amer 59 (*)    All other components within normal limits  CBC WITH DIFFERENTIAL/PLATELET - Abnormal; Notable for the following components:   WBC 15.8 (*)    Neutro Abs 14.3  (*)    Lymphs Abs 0.9 (*)    All other components within normal limits   ____________________________________________   EKG  Interpreted by me Sinus rhythm rate of 77, normal axis, first-degree AV block.  Normal QRS ST segments and T waves.  ____________________________________________    RADIOLOGY  Ct Head Wo Contrast  Result Date: 06/03/2018 CLINICAL DATA:  Status post fall today. The patient was down for approximately 1 hour. Initial encounter. EXAM: CT HEAD WITHOUT  CONTRAST TECHNIQUE: Contiguous axial images were obtained from the base of the skull through the vertex without intravenous contrast. COMPARISON:  Head CT 12/17/2017. FINDINGS: Brain: No evidence of acute infarction, hemorrhage, hydrocephalus, extra-axial collection or mass lesion/mass effect. Cortical atrophy is noted. Vascular: No hyperdense vessel or unexpected calcification. Skull: Intact.  No focal lesion. Sinuses/Orbits: Negative. Other: None. IMPRESSION: No acute abnormality. Cortical atrophy. Electronically Signed   By: Drusilla Kanner M.D.   On: 06/03/2018 19:17    ____________________________________________   PROCEDURES Procedures  ____________________________________________  DIFFERENTIAL DIAGNOSIS   Intracranial hemorrhage, subdural hematoma, dehydration, metabolic derangement  CLINICAL IMPRESSION / ASSESSMENT AND PLAN / ED COURSE  Pertinent labs & imaging results that were available during my care of the patient were reviewed by me and considered in my medical decision making (see chart for details).    Patient is well-appearing and nontoxic with unremarkable vital signs, presents with some dizziness and vague episodic symptoms in the setting of starting a new antidepressant.  Otherwise in her usual state of health without acute symptoms.  CT scan obtained to evaluate for acute traumatic injury, was negative.  Labs unremarkable.  She is well-appearing and suitable for discharge home in good  condition, follow-up with primary care.  Recommended she discontinue the Rexulti until she sees her doctor since it is causing her significant side effects and putting her at risk of injury.      ____________________________________________   FINAL CLINICAL IMPRESSION(S) / ED DIAGNOSES    Final diagnoses:  Minor head injury, initial encounter  Dizziness     ED Discharge Orders    None      Portions of this note were generated with dragon dictation software. Dictation errors may occur despite best attempts at proofreading.    Sharman Cheek, MD 06/03/18 2022

## 2018-06-03 NOTE — ED Triage Notes (Signed)
Pt came in by EMS. They responded to her home for a fall. Pt was on the floor for approximately for an hour. No one witnessed the fall. Pt states she did hit her head. Pt was recently started on Rexulti 2 days ago which has caused her confusion. Has hx of frequent falls due to back surgery in the past. EMS reports pt vomited x 2 while onscene. Vitals were normal with EMS. BG was 116 mg/dL/ Pt is AxOx3. Pt reports that she has been "stumbling a little" but she does not report confusion.

## 2018-06-03 NOTE — Discharge Instructions (Addendum)
Your CT scan of the head was unremarkable today.  Your labs were okay.  Recommend discontinuing your Rexulti until you can talk to your doctor again since it is causing you significant side effects.

## 2018-08-23 ENCOUNTER — Ambulatory Visit
Admission: EM | Admit: 2018-08-23 | Discharge: 2018-08-23 | Disposition: A | Discharge status: Home / Self Care | Payer: Medicare Other | Attending: Family Medicine | Admitting: Family Medicine

## 2018-08-23 ENCOUNTER — Ambulatory Visit: Payer: Medicare Other

## 2018-08-23 ENCOUNTER — Other Ambulatory Visit: Payer: Self-pay

## 2018-08-23 DIAGNOSIS — Z7952 Long term (current) use of systemic steroids: Secondary | ICD-10-CM | POA: Insufficient documentation

## 2018-08-23 DIAGNOSIS — M79642 Pain in left hand: Secondary | ICD-10-CM | POA: Insufficient documentation

## 2018-08-23 DIAGNOSIS — M25542 Pain in joints of left hand: Secondary | ICD-10-CM | POA: Diagnosis not present

## 2018-08-23 DIAGNOSIS — Z79890 Hormone replacement therapy (postmenopausal): Secondary | ICD-10-CM | POA: Diagnosis not present

## 2018-08-23 DIAGNOSIS — Z9889 Other specified postprocedural states: Secondary | ICD-10-CM | POA: Diagnosis not present

## 2018-08-23 DIAGNOSIS — M778 Other enthesopathies, not elsewhere classified: Secondary | ICD-10-CM

## 2018-08-23 DIAGNOSIS — Z79899 Other long term (current) drug therapy: Secondary | ICD-10-CM | POA: Insufficient documentation

## 2018-08-23 DIAGNOSIS — E119 Type 2 diabetes mellitus without complications: Secondary | ICD-10-CM | POA: Diagnosis not present

## 2018-08-23 DIAGNOSIS — Z79891 Long term (current) use of opiate analgesic: Secondary | ICD-10-CM | POA: Insufficient documentation

## 2018-08-23 DIAGNOSIS — M779 Enthesopathy, unspecified: Secondary | ICD-10-CM

## 2018-08-23 DIAGNOSIS — I10 Essential (primary) hypertension: Secondary | ICD-10-CM | POA: Diagnosis not present

## 2018-08-23 DIAGNOSIS — M79645 Pain in left finger(s): Secondary | ICD-10-CM | POA: Insufficient documentation

## 2018-08-23 DIAGNOSIS — Z7984 Long term (current) use of oral hypoglycemic drugs: Secondary | ICD-10-CM | POA: Diagnosis not present

## 2018-08-23 DIAGNOSIS — Z809 Family history of malignant neoplasm, unspecified: Secondary | ICD-10-CM | POA: Insufficient documentation

## 2018-08-23 DIAGNOSIS — F329 Major depressive disorder, single episode, unspecified: Secondary | ICD-10-CM | POA: Diagnosis not present

## 2018-08-23 DIAGNOSIS — Z833 Family history of diabetes mellitus: Secondary | ICD-10-CM | POA: Diagnosis not present

## 2018-08-23 DIAGNOSIS — M549 Dorsalgia, unspecified: Secondary | ICD-10-CM | POA: Diagnosis not present

## 2018-08-23 MED ORDER — PREDNISONE 10 MG PO TABS: ORAL_TABLET | ORAL | 0 refills | Status: DC

## 2018-08-23 NOTE — ED Triage Notes (Signed)
Patient complains of left hand pain that started around 5 days ago, primarily in the thumb. Patient states that it has been a constant ache.

## 2018-08-23 NOTE — Discharge Instructions (Addendum)
Take medication as prescribed. Rest and use splint. Stretch area.   Follow up with orthopedic next week as discussed.   Follow up with your primary care physician this week as needed. Return to Urgent care for new or worsening concerns.
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
## 2018-12-02 DIAGNOSIS — E1159 Type 2 diabetes mellitus with other circulatory complications: Secondary | ICD-10-CM | POA: Insufficient documentation

## 2018-12-02 DIAGNOSIS — E1169 Type 2 diabetes mellitus with other specified complication: Secondary | ICD-10-CM | POA: Insufficient documentation

## 2018-12-02 DIAGNOSIS — G8929 Other chronic pain: Secondary | ICD-10-CM | POA: Insufficient documentation

## 2018-12-02 DIAGNOSIS — E1122 Type 2 diabetes mellitus with diabetic chronic kidney disease: Secondary | ICD-10-CM | POA: Insufficient documentation

## 2019-01-13 ENCOUNTER — Ambulatory Visit: Payer: Medicare Other

## 2019-01-13 ENCOUNTER — Ambulatory Visit
Admission: EM | Admit: 2019-01-13 | Discharge: 2019-01-13 | Disposition: A | Discharge status: Home / Self Care | Payer: Medicare Other | Attending: Emergency Medicine | Admitting: Emergency Medicine

## 2019-01-13 ENCOUNTER — Other Ambulatory Visit: Payer: Self-pay

## 2019-01-13 DIAGNOSIS — S0083XA Contusion of other part of head, initial encounter: Secondary | ICD-10-CM | POA: Insufficient documentation

## 2019-01-13 DIAGNOSIS — E119 Type 2 diabetes mellitus without complications: Secondary | ICD-10-CM | POA: Insufficient documentation

## 2019-01-13 DIAGNOSIS — Z79890 Hormone replacement therapy (postmenopausal): Secondary | ICD-10-CM | POA: Insufficient documentation

## 2019-01-13 DIAGNOSIS — F329 Major depressive disorder, single episode, unspecified: Secondary | ICD-10-CM | POA: Diagnosis not present

## 2019-01-13 DIAGNOSIS — Z794 Long term (current) use of insulin: Secondary | ICD-10-CM | POA: Diagnosis not present

## 2019-01-13 DIAGNOSIS — S93401A Sprain of unspecified ligament of right ankle, initial encounter: Secondary | ICD-10-CM | POA: Insufficient documentation

## 2019-01-13 DIAGNOSIS — Z809 Family history of malignant neoplasm, unspecified: Secondary | ICD-10-CM | POA: Insufficient documentation

## 2019-01-13 DIAGNOSIS — T07XXXA Unspecified multiple injuries, initial encounter: Secondary | ICD-10-CM

## 2019-01-13 DIAGNOSIS — S9001XA Contusion of right ankle, initial encounter: Secondary | ICD-10-CM | POA: Diagnosis not present

## 2019-01-13 DIAGNOSIS — Z79899 Other long term (current) drug therapy: Secondary | ICD-10-CM | POA: Diagnosis not present

## 2019-01-13 DIAGNOSIS — W19XXXA Unspecified fall, initial encounter: Secondary | ICD-10-CM | POA: Diagnosis not present

## 2019-01-13 DIAGNOSIS — I10 Essential (primary) hypertension: Secondary | ICD-10-CM | POA: Insufficient documentation

## 2019-01-13 DIAGNOSIS — Z833 Family history of diabetes mellitus: Secondary | ICD-10-CM | POA: Insufficient documentation

## 2019-01-13 DIAGNOSIS — Z888 Allergy status to other drugs, medicaments and biological substances status: Secondary | ICD-10-CM | POA: Insufficient documentation

## 2019-01-13 DIAGNOSIS — I6782 Cerebral ischemia: Secondary | ICD-10-CM | POA: Insufficient documentation

## 2019-01-13 MED ORDER — MUPIROCIN 2 % EX OINT: 1.0000 "application " | TOPICAL_OINTMENT | Freq: Three times a day (TID) | CUTANEOUS | 0 refills | Status: DC

## 2019-01-13 NOTE — Discharge Instructions (Signed)
You may take 200 mg ibuprofen combined with 1 g of Tylenol 3-4 times a day.  Do not take the ibuprofen for more than several days.  However you can continue the Tylenol.  Ice, elevate your ankle and your face.  Bactroban to your abrasions to help prevent infection.  Follow-up with your doctor in several days if you are not getting any better, go immediately to the ER if you get worse, have a severe headache, neck stiffness, strokelike symptoms, visual changes, or for any other concerns.

## 2019-01-13 NOTE — ED Provider Notes (Signed)
HPI  SUBJECTIVE:  Tamara BertholdRoberta Gruenhagen is a 73 y.o. female who presents with facial contusions, multiple abrasions and right ankle pain/swelling after having a mechanical fall earlier today.  States that she was carrying her dog, lost her balance and fell backwards onto [place], concrete and bricks.  She states that she does not have good balance at baseline.  Denies chest pain, shortness of breath, palpitations, syncope causing her fall.  She denies neck, chest, abdominal, back pain. she is not sure if she twisted or had any direct trauma to her ankle.  She was able to weight-bear immediately afterwards.  She reports lateral right ankle swelling, sharp, intermittent, seconds long pain.  She reports bruising along distal fibula.  No limitation of motion.  Denies injury to the knee or foot.  No distal numbness or tingling.  She tried 1300 mg Tylenol without improvement in her symptoms.  Symptoms are worse with palpation.  It is not affected with walking, nor is it always associated with movement.  She also reports bruising, swelling, to her superior and lateral orbits bilaterally.  She has an abrasion over her left forehead.  She states that her face might have "bounced off" a planter.  She denies loss of consciousness, nausea, vomiting, visual changes, arm or leg weakness, facial droop, slurred speech, headache, neck pain, epistaxis, nasal congestion difficulty breathing through her nose.  Ports pain at the site of bruising.  No trismus.  She has a past medical history of diabetes, peripheral neuropathy.  She is on aspirin 81 mg daily.  No other antiplatelet or anticoagulants.  She has a past medical history of hypothyroidism, status post 4 back surgeries and has "balance issues".  No history of osteoporosis, concussion, head injury, history of right ankle injury.  ZOX:WRUEAVPMD:[place], [facility]   Past Medical History:  Diagnosis Date  . Depression   . Diabetes mellitus without complication (HCC)   .  Hypertension     Past Surgical History:  Procedure Laterality Date  . ACHILLES TENDON REPAIR    . BACK SURGERY    . HERNIA REPAIR    . SHOULDER SURGERY      Family History  Adopted: Yes  Problem Relation Age of Onset  . Cancer Mother   . Diabetes Mother     Social History   Tobacco Use  . Smoking status: Never Smoker  . Smokeless tobacco: Never Used  Substance Use Topics  . Alcohol use: No  . Drug use: No    No current facility-administered medications for this encounter.   Current Outpatient Medications:  .  acetaminophen (TYLENOL) 325 MG tablet, Take 650 mg by mouth every 6 (six) hours as needed., Disp: , Rfl:  .  aspirin 81 MG chewable tablet, Chew by mouth daily., Disp: , Rfl:  .  atorvastatin (LIPITOR) 10 MG tablet, Take 10 mg by mouth daily., Disp: , Rfl:  .  BuPROPion HCl ER, XL, 450 MG TB24, Take 1 tablet by mouth daily., Disp: , Rfl:  .  DULoxetine (CYMBALTA) 60 MG capsule, , Disp: , Rfl:  .  insulin aspart protamine- aspart (NOVOLOG MIX 70/30) (70-30) 100 UNIT/ML injection, Inject 32 Units into the skin 2 (two) times daily with a meal. , Disp: , Rfl:  .  levothyroxine (SYNTHROID, LEVOTHROID) 125 MCG tablet, Take 125 mcg by mouth daily before breakfast., Disp: , Rfl:  .  metFORMIN (GLUCOPHAGE-XR) 500 MG 24 hr tablet, Take 1 tablet by mouth 2 (two) times daily., Disp: , Rfl:  .  methylphenidate (RITALIN) 10 MG tablet, , Disp: , Rfl:  .  propranolol (INDERAL) 40 MG tablet, Take 40 mg by mouth 2 (two) times daily., Disp: , Rfl:  .  TRULICITY 1.5 MG/0.5ML SOPN, Inject 1.5 mg into the skin once a week., Disp: , Rfl:  .  valsartan (DIOVAN) 80 MG tablet, Take 40 mg by mouth daily. , Disp: , Rfl:  .  mupirocin ointment (BACTROBAN) 2 %, Apply 1 application topically 3 (three) times daily., Disp: 22 g, Rfl: 0  Allergies  Allergen Reactions  . Ace Inhibitors Cough     ROS  As noted in HPI.   Physical Exam  BP (!) 124/98 (BP Location: Left Arm)   Pulse 75    Temp 98.4 F (36.9 C) (Oral)   Resp 18   Ht 5\' 7"  (1.702 m)   Wt 86.2 kg   SpO2 98%   BMI 29.76 kg/m   Constitutional: Well developed, well nourished, no acute distress Eyes: PERRL, EOMI, conjunctiva normal bilaterally no direct or consensual photophobia HENT: Normocephalic, positive swelling, bruising lateral orbits bilaterally.  Positive abrasion left forehead no other facial tenderness.  No hemotympanum.  Septum midline.  No trismus.      Respiratory: Clear to auscultation bilaterally, no rales, no wheezing, no rhonchi Cardiovascular: Normal rate and rhythm, no murmurs, no gallops, no rubs GI: nonDistended Back: No C-spine, T-spine  L-spine,tenderness Skin: abrasions over face, lower extremities, feet. Musculoskeletal:  R  Ankle lateral soft tissue swelling, positive abrasion distal fibula, proximal fibula NT, Distal fibula tender, Medial malleolus NT,  Deltoid ligament medially NT,  Lateral ligaments NT, ATFL laterally NT, posterior tablofibular ligament laterally  NT , calcaneofibular ligament laterally NT,  Achilles NT, calcaneus  NT,  Proximal 5th metatarsal tender, Midfoot NT, distal NVI with baseline sensation / motor to foot with CR<2 seconds. no pain with dorsiflexion/plantar flexion. pain with inversion/eversion. + Distal fibular bruising. - squeeze test .  Ant drawer test stable. Pt able to bear weight in dept.  Neurologic: Alert & oriented x 3, CN II-XII grossly intact, no motor deficits, sensation grossly intact Psychiatric: Speech and behavior appropriate   ED Course   Medications - No data to display  Orders Placed This Encounter  Procedures  . DG Ankle Complete Right    Standing Status:   Standing    Number of Occurrences:   1    Order Specific Question:   Reason for Exam (SYMPTOM  OR DIAGNOSIS REQUIRED)    Answer:   fibular  tenderness s/o trauma r/o fx  . DG Foot Complete Right    Standing Status:   Standing    Number of Occurrences:   1    Order Specific  Question:   Reason for Exam (SYMPTOM  OR DIAGNOSIS REQUIRED)    Answer:   fibular  tenderness s/o trauma r/o fx  . CT Maxillofacial Wo Contrast    Standing Status:   Standing    Number of Occurrences:   1  . CT Head Wo Contrast    Standing Status:   Standing    Number of Occurrences:   1  . Apply ASO ankle    Standing Status:   Standing    Number of Occurrences:   1    Order Specific Question:   Laterality    Answer:   Right   No results found for this or any previous visit (from the past 24 hour(s)). Dg Ankle Complete Right  Result Date: 01/13/2019 CLINICAL DATA:  Fall today.  Lateral ankle and foot pain. EXAM: RIGHT ANKLE - COMPLETE 3+ VIEW COMPARISON:  None. FINDINGS: The mineralization and alignment are normal. There is no evidence of acute fracture or dislocation. Mild nonspecific sclerosis of the distal fibula. The joint spaces are preserved. There is no focal soft tissue swelling. IMPRESSION: No acute osseous findings. Electronically Signed   By: Carey Bullocks M.D.   On: 01/13/2019 17:47   Ct Head Wo Contrast  Result Date: 01/13/2019 CLINICAL DATA:  73 y/o F; fall with head injury. Contusion around the right eye and abrasion to the left forehead. EXAM: CT HEAD WITHOUT CONTRAST CT MAXILLOFACIAL WITHOUT CONTRAST TECHNIQUE: Multidetector CT imaging of the head and maxillofacial structures were performed using the standard protocol without intravenous contrast. Multiplanar CT image reconstructions of the maxillofacial structures were also generated. COMPARISON:  06/03/2018 CT head. FINDINGS: CT HEAD FINDINGS Brain: No evidence of acute infarction, hemorrhage, hydrocephalus, extra-axial collection or mass lesion/mass effect. Subcentimeter posterior falcine lipoma. Stable chronic microvascular ischemic changes and volume loss of the brain. Vascular: Calcific atherosclerosis of the carotid siphons. Skull: Small left frontal scalp and right periorbital soft tissue contusions. No calvarial  fracture. Other: None. CT MAXILLOFACIAL FINDINGS Osseous: No fracture or mandibular dislocation. No destructive process. Orbits: No traumatic or inflammatory finding of the orbital compartments. Sinuses: Clear. Soft tissues: Small left frontal scalp and right periorbital soft tissue contusions. IMPRESSION: 1. Small left frontal scalp and right periorbital soft tissue contusions. No calvarial fracture. 2. No acute intracranial abnormality. 3. No acute facial fracture or mandibular dislocation. 4. Stable chronic microvascular ischemic changes and volume loss of the brain. Electronically Signed   By: Mitzi Hansen M.D.   On: 01/13/2019 18:16   Dg Foot Complete Right  Result Date: 01/13/2019 CLINICAL DATA:  Fall today.  Lateral ankle and foot pain. EXAM: RIGHT FOOT COMPLETE - 3+ VIEW COMPARISON:  None. FINDINGS: The mineralization and alignment are normal. There is no evidence of acute fracture or dislocation. There is mild spurring at the base of the 5th metatarsal and along the posterior and plantar aspects of the calcaneal tuberosity. No focal soft tissue swelling identified. IMPRESSION: No evidence of acute fracture or dislocation. Electronically Signed   By: Carey Bullocks M.D.   On: 01/13/2019 17:48   Ct Maxillofacial Wo Contrast  Result Date: 01/13/2019 CLINICAL DATA:  73 y/o F; fall with head injury. Contusion around the right eye and abrasion to the left forehead. EXAM: CT HEAD WITHOUT CONTRAST CT MAXILLOFACIAL WITHOUT CONTRAST TECHNIQUE: Multidetector CT imaging of the head and maxillofacial structures were performed using the standard protocol without intravenous contrast. Multiplanar CT image reconstructions of the maxillofacial structures were also generated. COMPARISON:  06/03/2018 CT head. FINDINGS: CT HEAD FINDINGS Brain: No evidence of acute infarction, hemorrhage, hydrocephalus, extra-axial collection or mass lesion/mass effect. Subcentimeter posterior falcine lipoma. Stable chronic  microvascular ischemic changes and volume loss of the brain. Vascular: Calcific atherosclerosis of the carotid siphons. Skull: Small left frontal scalp and right periorbital soft tissue contusions. No calvarial fracture. Other: None. CT MAXILLOFACIAL FINDINGS Osseous: No fracture or mandibular dislocation. No destructive process. Orbits: No traumatic or inflammatory finding of the orbital compartments. Sinuses: Clear. Soft tissues: Small left frontal scalp and right periorbital soft tissue contusions. IMPRESSION: 1. Small left frontal scalp and right periorbital soft tissue contusions. No calvarial fracture. 2. No acute intracranial abnormality. 3. No acute facial fracture or mandibular dislocation. 4. Stable chronic microvascular ischemic changes and volume loss of the brain. Electronically  Signed   By: Mitzi Hansen M.D.   On: 01/13/2019 18:16    ED Clinical Impression  Fall, initial encounter  Contusion of face, initial encounter  Multiple abrasions  Sprain of right ankle, unspecified ligament, initial encounter  Contusion of right ankle, initial encounter   ED Assessment/Plan  Reviewed imaging independently.  Foot ankle normal.  CT head no findings.  Positive left frontal scalp and right periorbital soft tissue contusions.  No fracture and CT maxillofacial.  See radiology report for full details.  1.  Facial trauma with abrasions.  Getting head CT due to age to rule out intracranial injury, CT maxillofacial to rule out orbital fracture.  No C-spine tenderness.  Patient has full range of motion of her neck.  No numbness or tingling in her extremities.  Deferring imaging of the C-spine.  Soap and water, Bactroban for the abrasions.  Ice for the bruising around her eyes.  2.  Right ankle injury.  Right ankle x-ray.  Also foot x-ray due to tenderness at the base of the fifth metatarsal.    Patient with a sprain of her/contusion of her right ankle.  X-rays negative. Ice, ASO, 200  mg ibuprofen combined with 1 g of Tylenol 3-4 times a day as needed for pain.  No ibuprofen for more than 3 or 4 days- slightly elevated creatinine in the past.  Follow up with PMD as needed, to the ER if she gets worse.  Follow up With [facility] if her ankle is still bothering her in a week  Discussed imaging, MDM, treatment plan, and plan for follow-up with patient Discussed sn/sx that should prompt return to the ED. patient agrees with plan.   Meds ordered this encounter  Medications  . mupirocin ointment (BACTROBAN) 2 %    Sig: Apply 1 application topically 3 (three) times daily.    Dispense:  22 g    Refill:  0    *This clinic note was created using Scientist, clinical (histocompatibility and immunogenetics). Therefore, there may be occasional mistakes despite careful proofreading.  ?   Domenick Gong, MD 01/13/19 [phone]

## 2019-01-13 NOTE — ED Triage Notes (Signed)
Patient states that she fell while carrying a dog. States that she fell backwards. States that she hit her face and right ankle. Patient has facial brasion and foot abrasions. Patient states that most pain is on right ankle.

## 2019-04-08 IMAGING — CR DG FOOT COMPLETE 3+V*R*
3 series · 3 of 3 positions shown · non-contrast
Comparison: None.

CLINICAL DATA: Fall today.  Lateral ankle and foot pain.

EXAM:
RIGHT FOOT COMPLETE - 3+ VIEW

[foot ap]
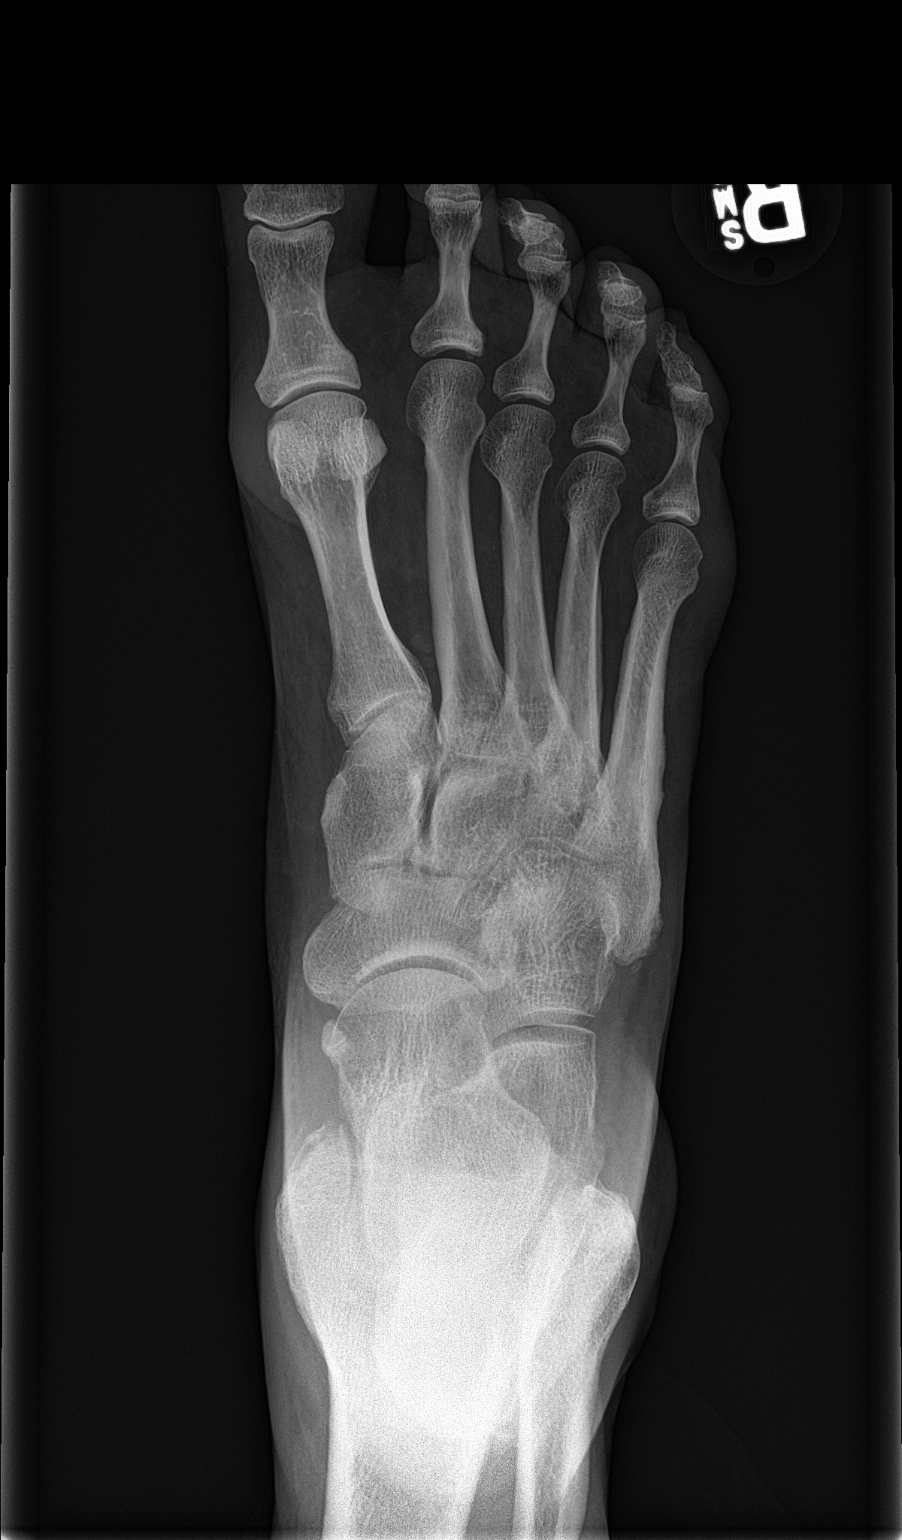

[foot obl]
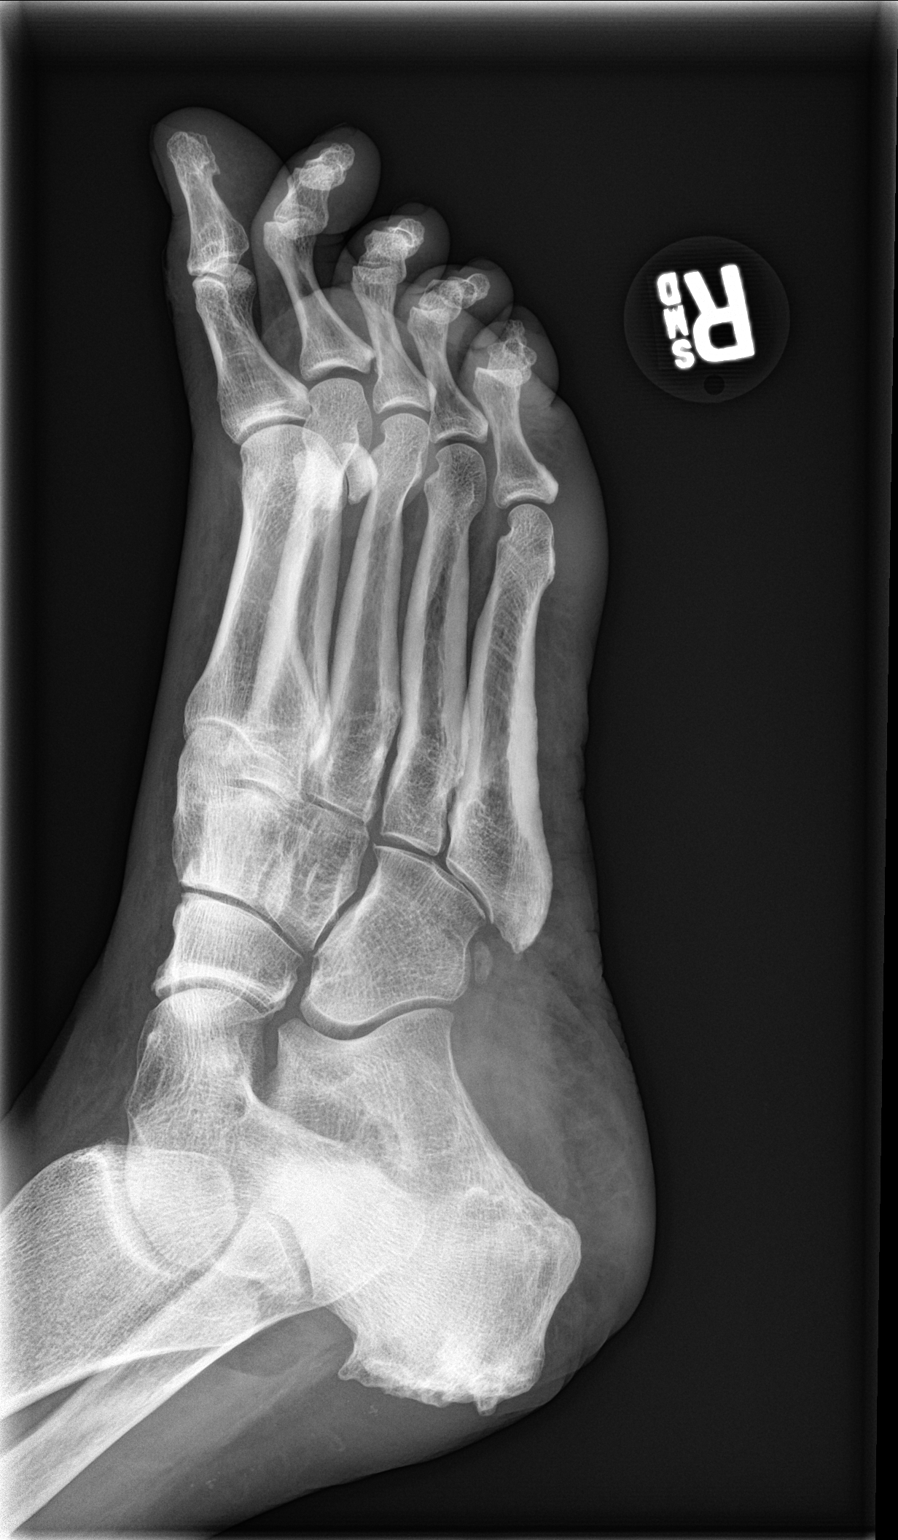

[foot lat]
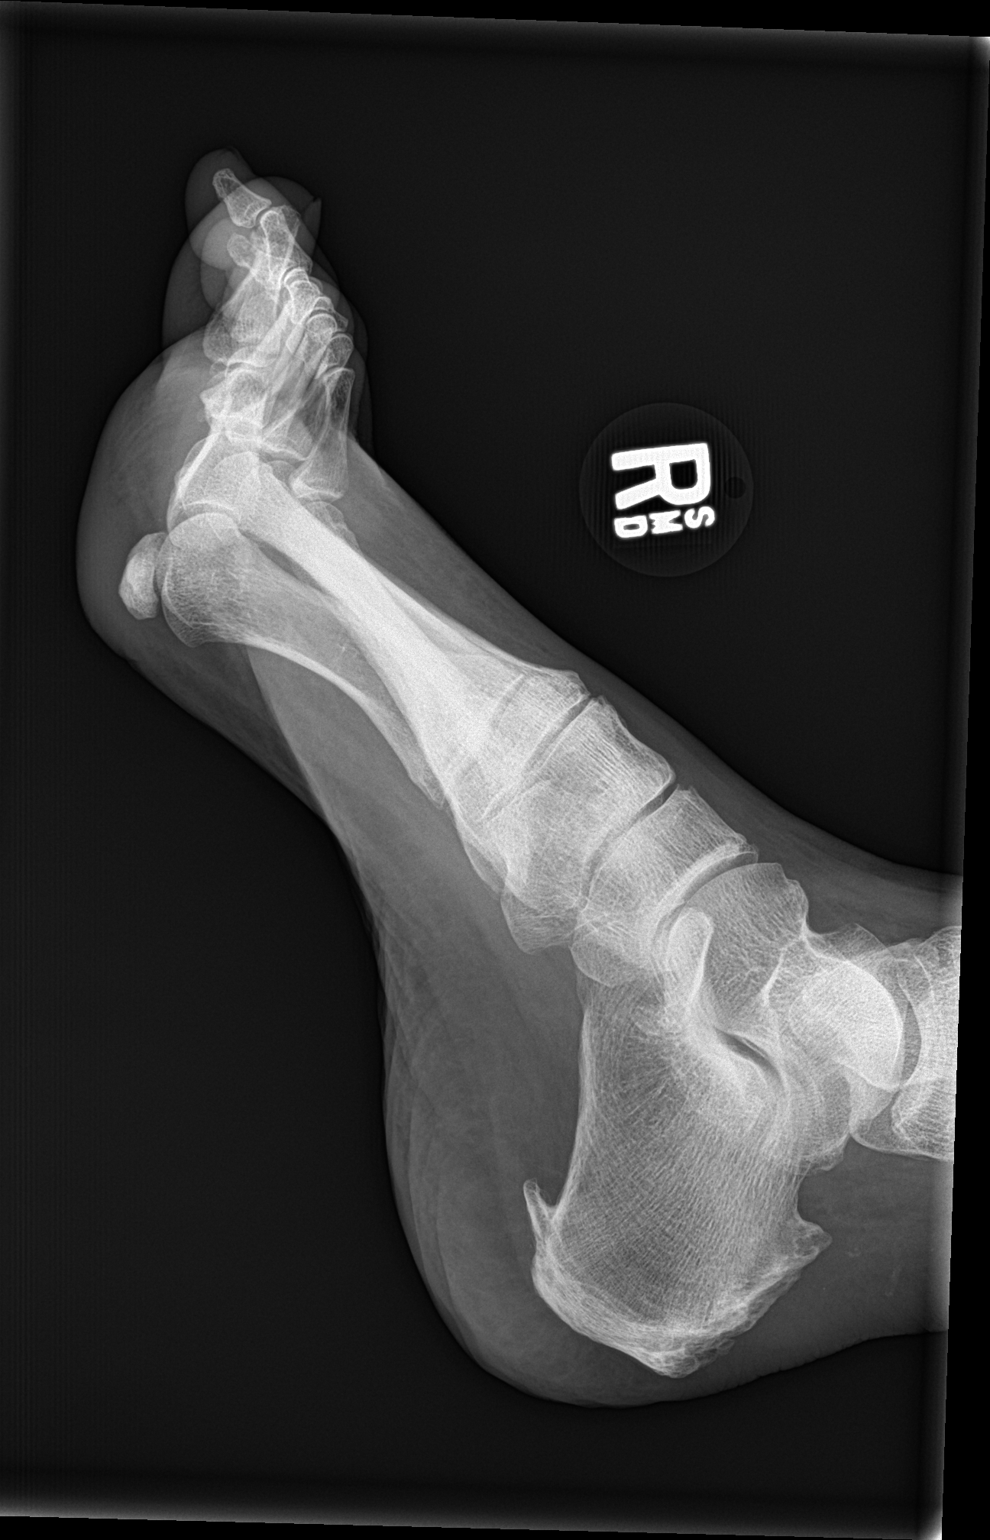

[3 of 3 positions shown; findings below may reference images not displayed]

FINDINGS: The mineralization and alignment are normal. There is no evidence of
acute fracture or dislocation. There is mild spurring at the base of
the 5th metatarsal and along the posterior and plantar aspects of
the calcaneal tuberosity. No focal soft tissue swelling identified.
IMPRESSION: No evidence of acute fracture or dislocation.

## 2019-04-08 IMAGING — CT CT MAXILLOFACIAL W/O CM
3 series · 16 of 40 positions shown, 18 images · non-contrast
Comparison: 06/03/2018 CT head.

CLINICAL DATA: 72 y/o F; fall with head injury. Contusion around
the right eye and abrasion to the left forehead.

EXAM:
CT HEAD WITHOUT CONTRAST
CT MAXILLOFACIAL WITHOUT CONTRAST
TECHNIQUE: Multidetector CT imaging of the head and maxillofacial structures
were performed using the standard protocol without intravenous
contrast. Multiplanar CT image reconstructions of the maxillofacial
structures were also generated.

[Series 2: head wo · axial · 0.39mm/px · z∈[-192,-77]mm · 7 of 31 slices shown, 9 images]
[im 4/31  brain]
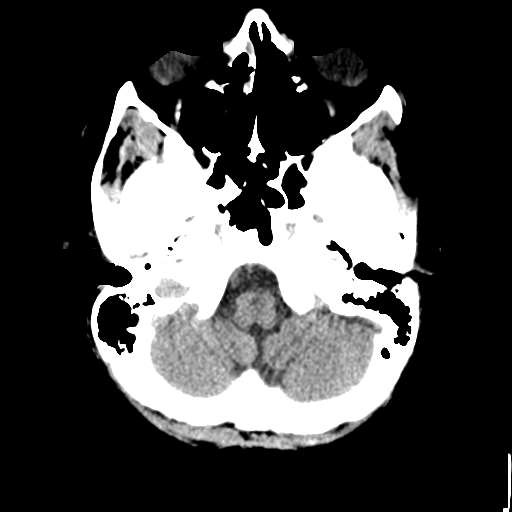
[im 4/31  bone]
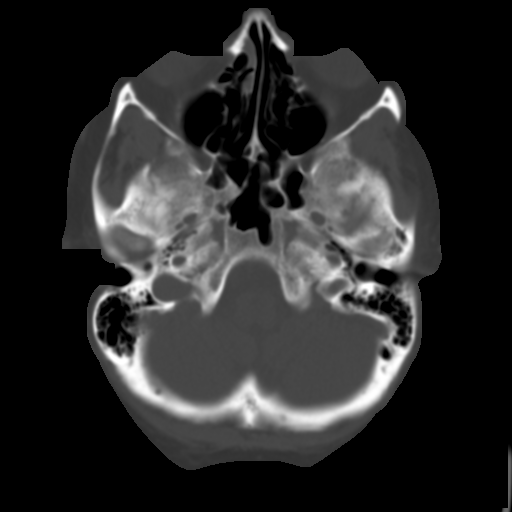
[im 8/31  bone]
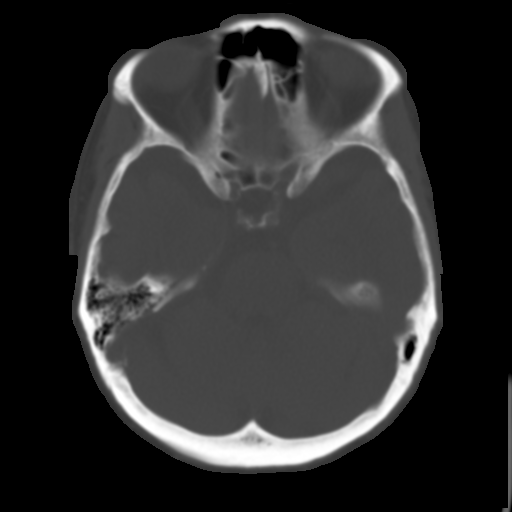
[im 12/31  bone]
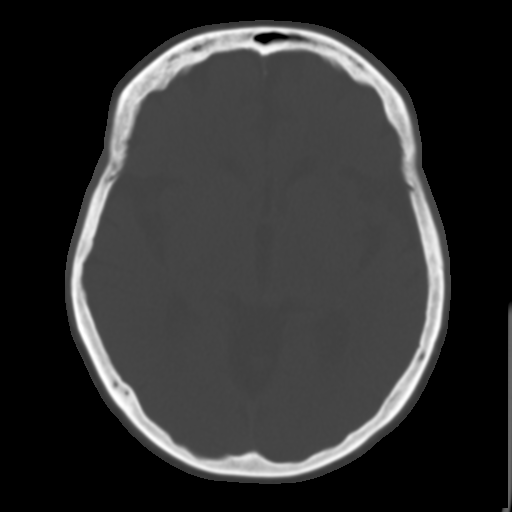
[im 16/31  bone]
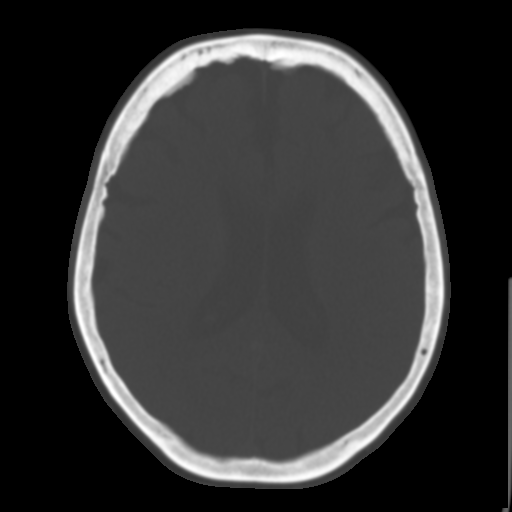
[im 19/31  brain]
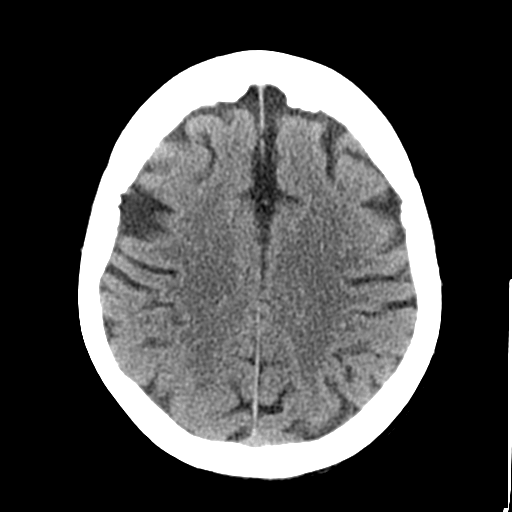
[im 19/31  bone]
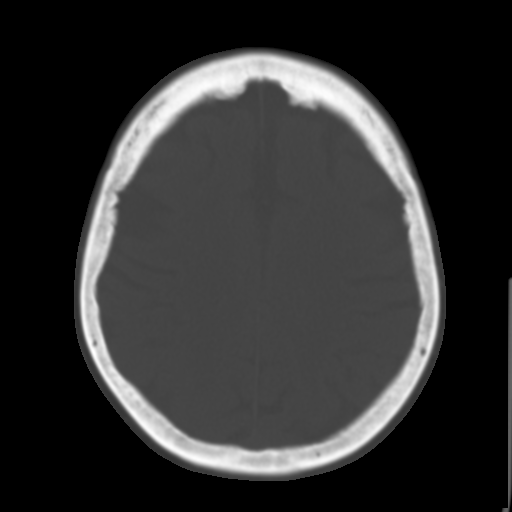
[im 23/31  bone]
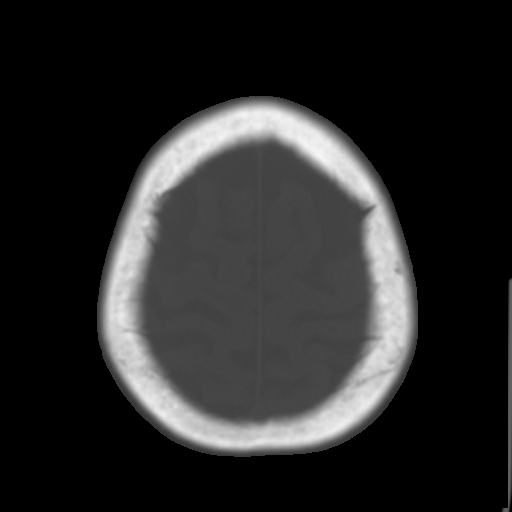
[im 27/31  bone]
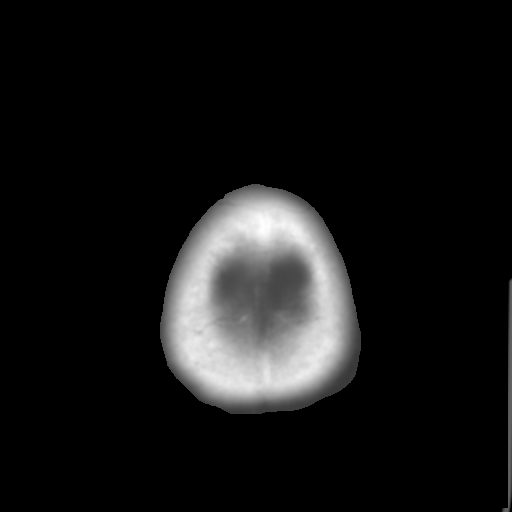

[Series 4: coronal soft tissue · coronal · 0.27mm/px · 3 of 65 slices shown]
[im 22/65  bone]
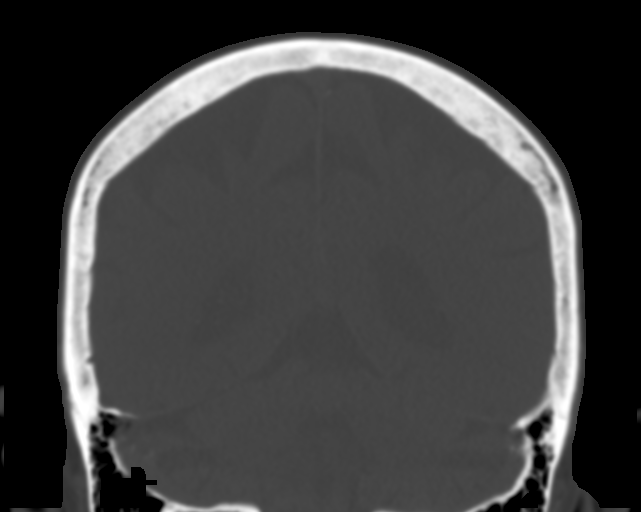
[im 29/65  bone]
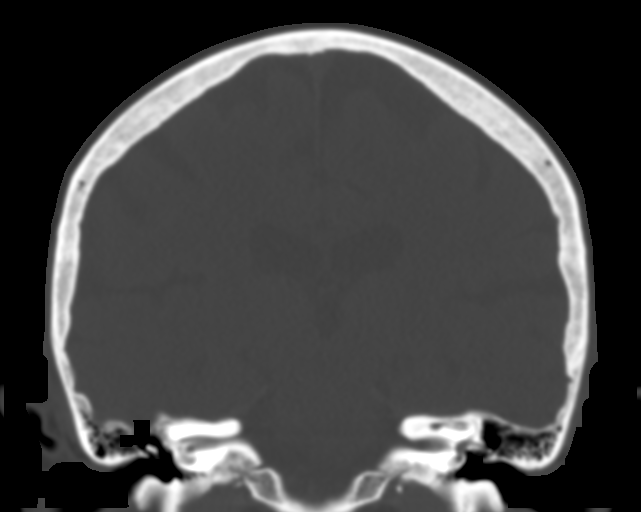
[im 36/65  bone]
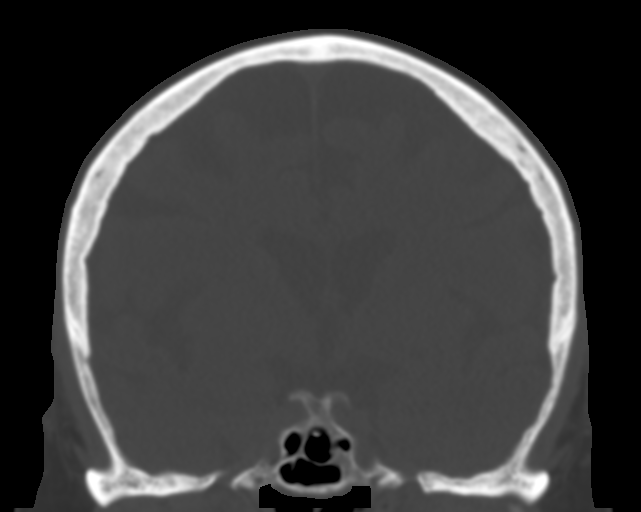

[Series 6: max soft · axial · 0.32mm/px · z∈[-272,-186]mm · 6 of 73 slices shown]
[im 8/73  brain]
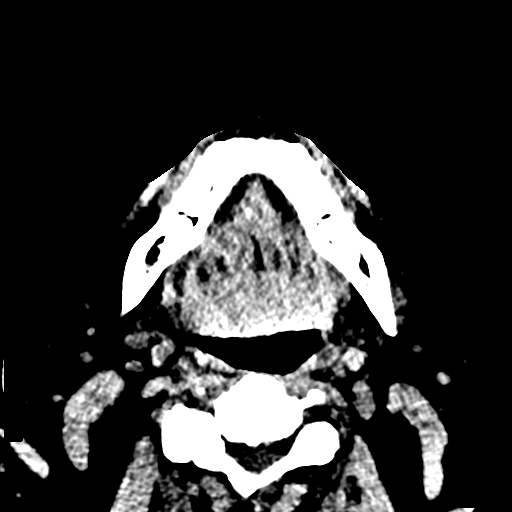
[im 15/73  brain]
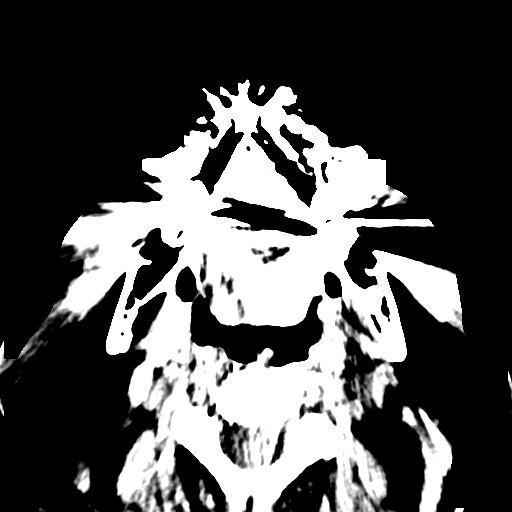
[im 22/73  brain]
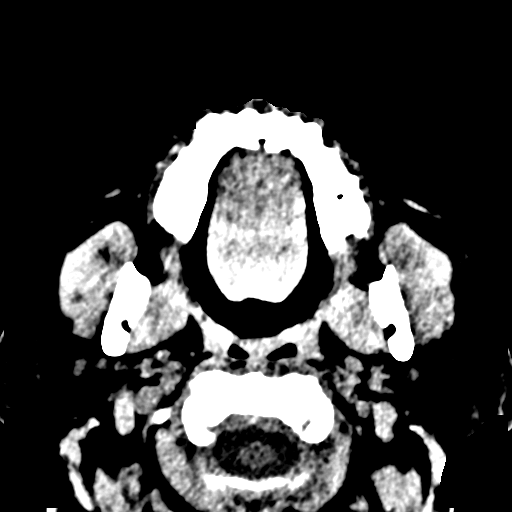
[im 33/73  brain]
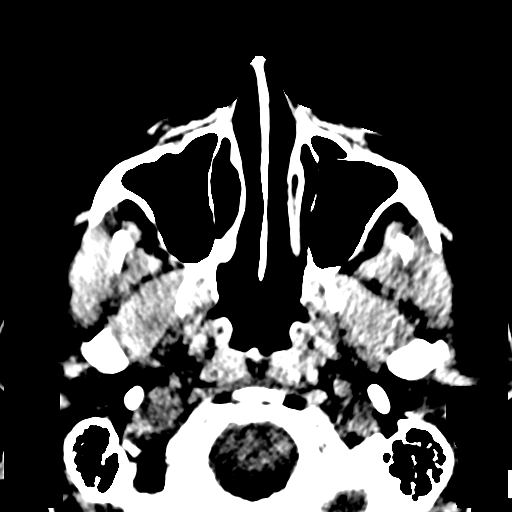
[im 40/73  brain]
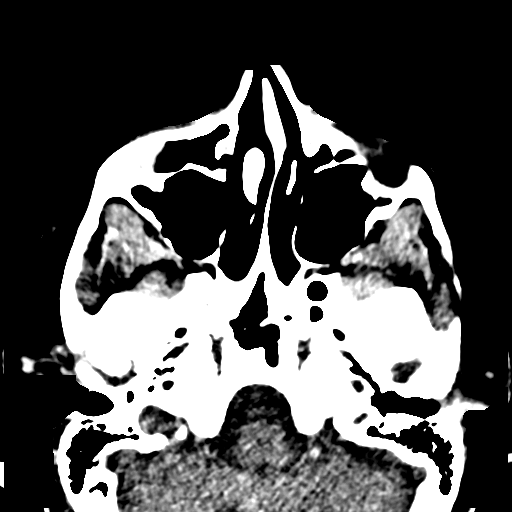
[im 51/73  brain]
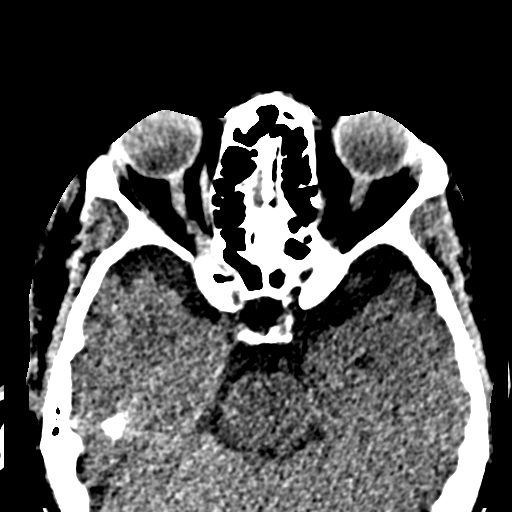

[16 of 40 positions shown; findings below may reference images not displayed]

FINDINGS: CT HEAD FINDINGS

Brain: No evidence of acute infarction, hemorrhage, hydrocephalus,
extra-axial collection or mass lesion/mass effect. Subcentimeter
posterior falcine lipoma. Stable chronic microvascular ischemic
changes and volume loss of the brain.

Vascular: Calcific atherosclerosis of the carotid siphons.

Skull: Small left frontal scalp and right periorbital soft tissue
contusions. No calvarial fracture.

Other: None.

CT MAXILLOFACIAL FINDINGS

Osseous: No fracture or mandibular dislocation. No destructive
process.

Orbits: No traumatic or inflammatory finding of the orbital
compartments.

Sinuses: Clear.

Soft tissues: Small left frontal scalp and right periorbital soft
tissue contusions.
IMPRESSION: 1. Small left frontal scalp and right periorbital soft tissue
contusions. No calvarial fracture.
2. No acute intracranial abnormality.
3. No acute facial fracture or mandibular dislocation.
4. Stable chronic microvascular ischemic changes and volume loss of
the brain.

## 2019-04-08 IMAGING — CR DG ANKLE COMPLETE 3+V*R*
3 series · 3 of 3 positions shown · non-contrast
Comparison: None.

CLINICAL DATA: Fall today.  Lateral ankle and foot pain.

EXAM:
RIGHT ANKLE - COMPLETE 3+ VIEW

[ankle ap]
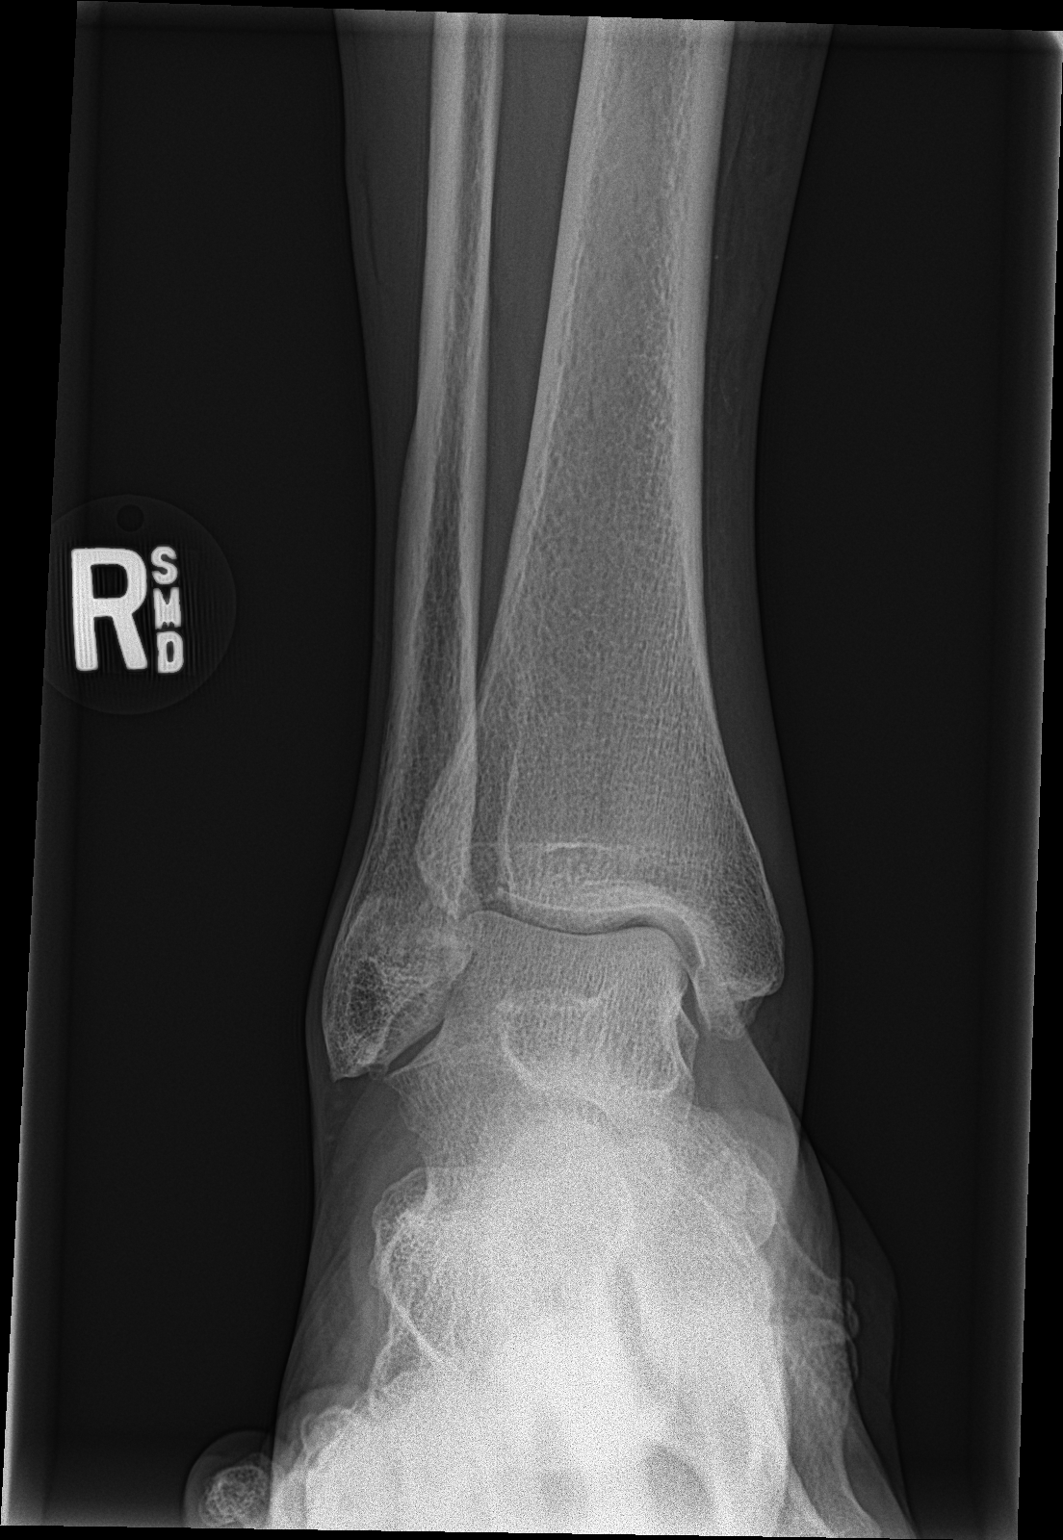

[ankle obl]
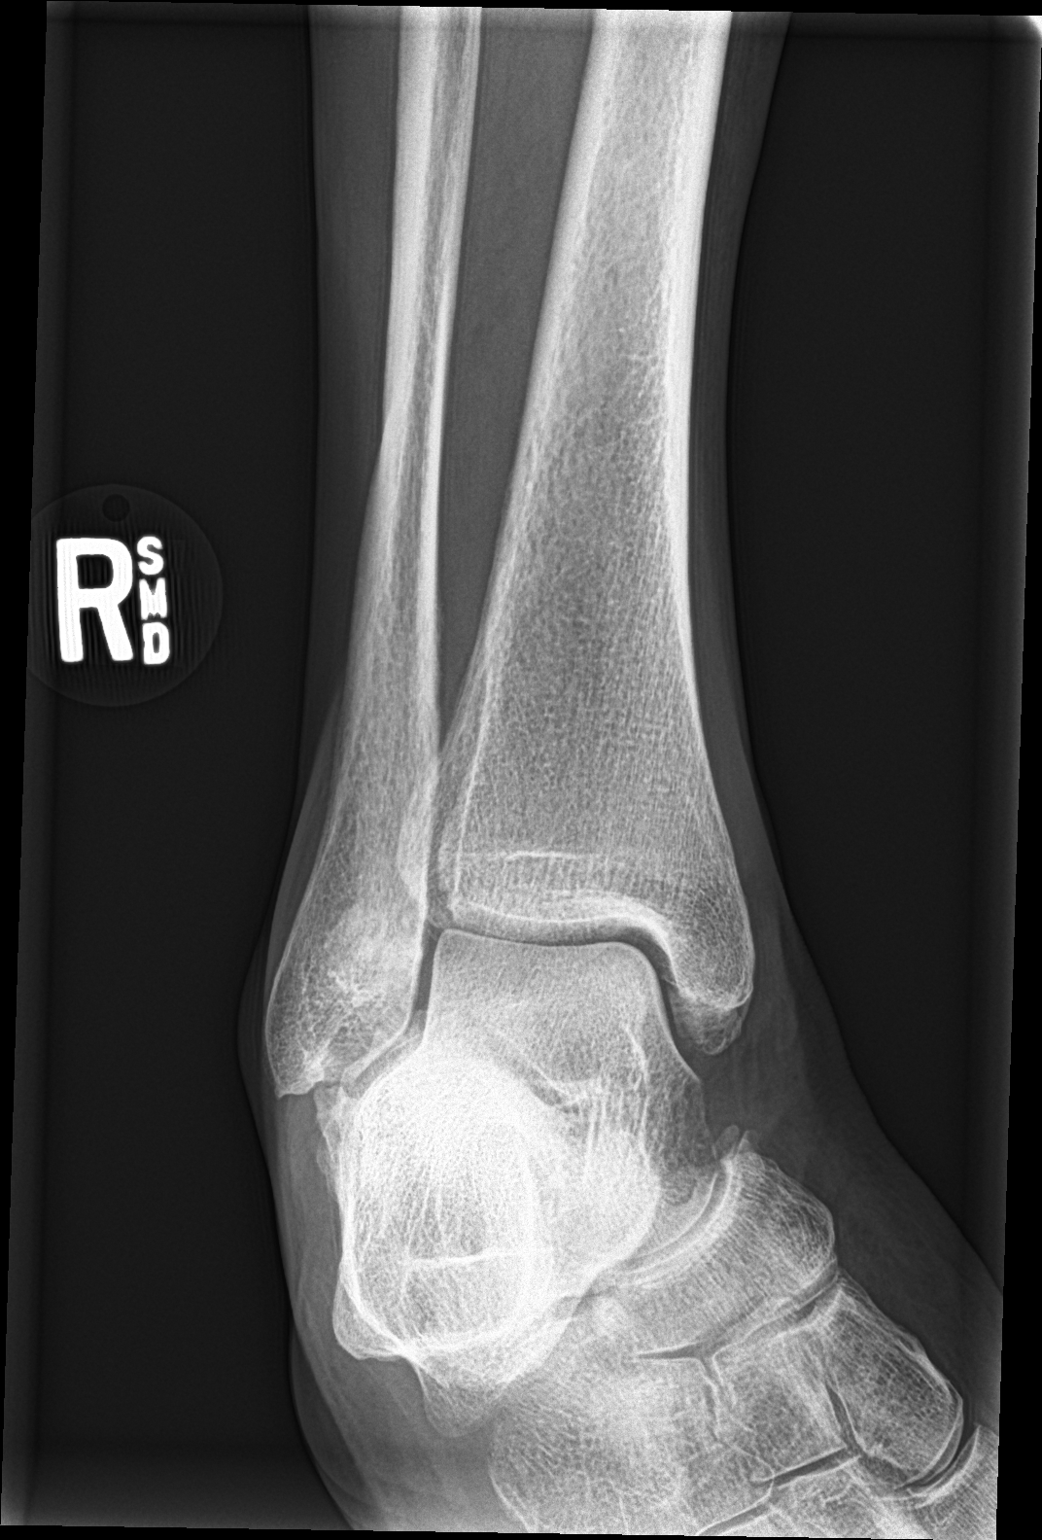

[ankle lat]
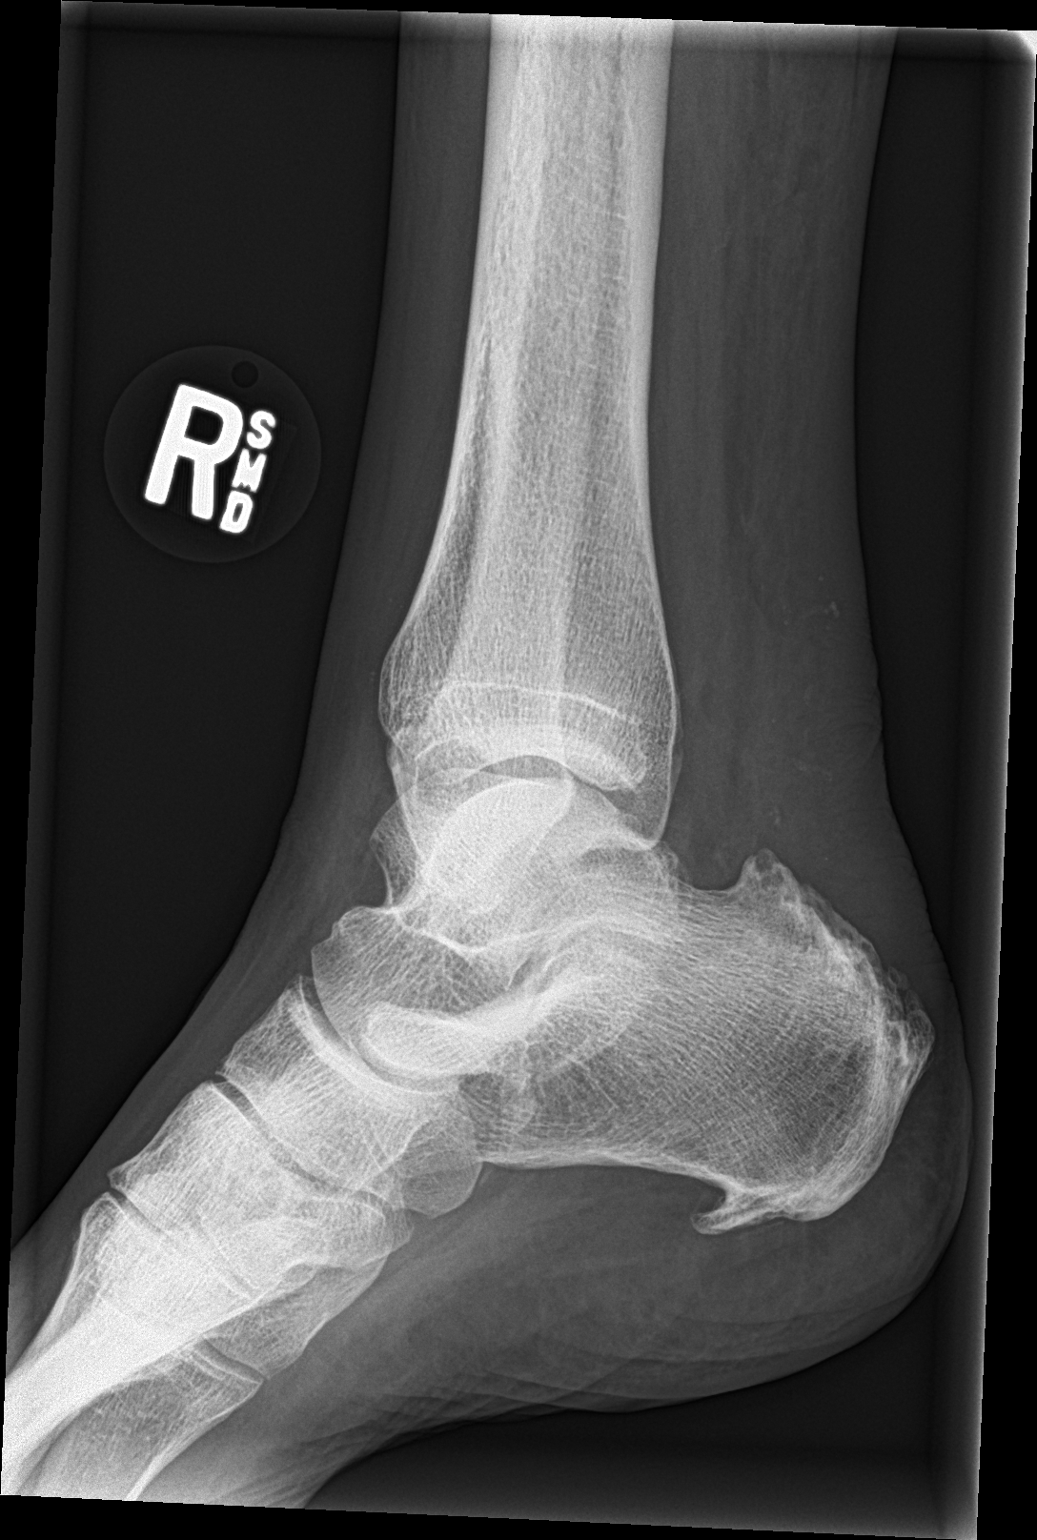

[3 of 3 positions shown; findings below may reference images not displayed]

FINDINGS: The mineralization and alignment are normal. There is no evidence of
acute fracture or dislocation. Mild nonspecific sclerosis of the
distal fibula. The joint spaces are preserved. There is no focal
soft tissue swelling.
IMPRESSION: No acute osseous findings.

## 2019-06-14 DIAGNOSIS — I10 Essential (primary) hypertension: Secondary | ICD-10-CM | POA: Diagnosis present

## 2019-07-18 ENCOUNTER — Other Ambulatory Visit: Payer: Self-pay | Admitting: Family Medicine

## 2019-07-18 DIAGNOSIS — Z780 Asymptomatic menopausal state: Secondary | ICD-10-CM

## 2019-08-30 DIAGNOSIS — Z9181 History of falling: Secondary | ICD-10-CM | POA: Insufficient documentation

## 2019-09-21 ENCOUNTER — Encounter: Payer: Self-pay | Admitting: Emergency Medicine

## 2019-09-21 ENCOUNTER — Other Ambulatory Visit: Payer: Self-pay

## 2019-09-21 ENCOUNTER — Ambulatory Visit
Admission: EM | Admit: 2019-09-21 | Discharge: 2019-09-21 | Disposition: A | Discharge status: Home / Self Care | Payer: Medicare Other | Attending: Family Medicine | Admitting: Family Medicine

## 2019-09-21 DIAGNOSIS — W19XXXA Unspecified fall, initial encounter: Secondary | ICD-10-CM | POA: Diagnosis not present

## 2019-09-21 DIAGNOSIS — M79604 Pain in right leg: Secondary | ICD-10-CM

## 2019-09-21 MED ORDER — NAPROXEN 500 MG PO TABS: 500.0000 mg | ORAL_TABLET | Freq: Two times a day (BID) | ORAL | 0 refills | Status: DC | PRN

## 2019-09-21 NOTE — Discharge Instructions (Signed)
Try heat.  Medication as prescribed.  If persists, see Ortho.  Take care  Dr. Lacinda Axon

## 2019-09-21 NOTE — ED Provider Notes (Signed)
[facility]    CSN: [id number] Arrival date & time: 09/21/19  1358  History   Chief Complaint Chief Complaint  Patient presents with  . Leg Pain   HPI  73 year old female presents with the above complaint.  Patient reports that approximately 2 weeks ago she suffered a fall.  She states that she was improving.  This Saturday she reached up for something and felt a sudden pop in the right posterior lateral thigh.  Patient reports that she has some bruising in the area.  She has been taking Tylenol and using ice as well as heat without resolution.  She reports that her pain is worse with activity.  No known relieving factors.  Denies back pain.  Rates her pain as 8/10 in severity.  However, she does not appear to be in significant pain upon history and physical exam.  No other reported symptoms.  No other complaints.  PMH, Surgical Hx, Family Hx, Social History reviewed and updated as below.  Past Medical History:  Diagnosis Date  . Depression   . Diabetes mellitus without complication ([place])   . Hypertension    Past Surgical History:  Procedure Laterality Date  . ACHILLES TENDON REPAIR    . BACK SURGERY    . HERNIA REPAIR    . SHOULDER SURGERY     OB History   No obstetric history on file.    Home Medications    Prior to Admission medications   Medication Sig Start Date End Date Taking? Authorizing Provider  acetaminophen (TYLENOL) 325 MG tablet Take 650 mg by mouth every 6 (six) hours as needed.   Yes [provider]  aspirin 81 MG chewable tablet Chew by mouth daily.   Yes [provider]  atorvastatin (LIPITOR) 10 MG tablet Take 10 mg by mouth daily.   Yes [provider]  BuPROPion HCl ER, XL, 450 MG TB24 Take 1 tablet by mouth daily.   Yes [provider]  DULoxetine (CYMBALTA) 60 MG capsule  12/26/18  Yes [provider]  insulin aspart protamine- aspart (NOVOLOG MIX 70/30) (70-30) 100 UNIT/ML injection Inject  32 Units into the skin 2 (two) times daily with a meal.    Yes [provider]  levothyroxine (SYNTHROID, LEVOTHROID) 125 MCG tablet Take 125 mcg by mouth daily before breakfast.   Yes [provider]  metFORMIN (GLUCOPHAGE-XR) 500 MG 24 hr tablet Take 1 tablet by mouth 2 (two) times daily. 12/09/17  Yes [provider]  mupirocin ointment (BACTROBAN) 2 % Apply 1 application topically 3 (three) times daily. 01/13/19  Yes Melynda Ripple, MD  propranolol (INDERAL) 40 MG tablet Take 40 mg by mouth 2 (two) times daily.   Yes [provider]  TRULICITY 1.5 TK/2.4OX SOPN Inject 1.5 mg into the skin once a week. 10/28/17  Yes [provider]  valsartan (DIOVAN) 80 MG tablet Take 40 mg by mouth daily.    Yes [provider]  naproxen (NAPROSYN) 500 MG tablet Take 1 tablet (500 mg total) by mouth 2 (two) times daily as needed for moderate pain. 09/21/19   Coral Spikes, DO  methylphenidate (RITALIN) 10 MG tablet  08/15/18 09/21/19  [provider]    Family History Family History  Adopted: Yes  Problem Relation Age of Onset  . Cancer Mother   . Diabetes Mother     Social History Social History   Tobacco Use  . Smoking status: Never Smoker  . Smokeless tobacco: Never  Used  Substance Use Topics  . Alcohol use: No  . Drug use: No     Allergies   Ace inhibitors   Review of Systems Review of Systems  Constitutional: Negative.   Musculoskeletal:       Leg pain.   Physical Exam Triage Vital Signs ED Triage Vitals  Enc Vitals Group     BP 09/21/19 1416 (!) 145/84     Pulse Rate 09/21/19 1416 65     Resp 09/21/19 1416 18     Temp 09/21/19 1416 98.5 F (36.9 C)     Temp Source 09/21/19 1416 Oral     SpO2 09/21/19 1416 99 %     Weight 09/21/19 1414 190 lb (86.2 kg)     Height 09/21/19 1414 5\' 7"  (1.702 m)     Head Circumference --      Peak Flow --      Pain Score 09/21/19 1414 8     Pain Loc --      Pain Edu? --       Excl. in GC? --    Updated Vital Signs BP (!) 145/84 (BP Location: Right Arm)   Pulse 65   Temp 98.5 F (36.9 C) (Oral)   Resp 18   Ht 5\' 7"  (1.702 m)   Wt 86.2 kg   SpO2 99%   BMI 29.76 kg/m   Visual Acuity Right Eye Distance:   Left Eye Distance:   Bilateral Distance:    Right Eye Near:   Left Eye Near:    Bilateral Near:     Physical Exam Vitals signs and nursing note reviewed.  Constitutional:      General: She is not in acute distress.    Appearance: Normal appearance. She is not ill-appearing.  HENT:     Head: Normocephalic and atraumatic.  Eyes:     General:        Right eye: No discharge.        Left eye: No discharge.     Conjunctiva/sclera: Conjunctivae normal.  Cardiovascular:     Rate and Rhythm: Normal rate and regular rhythm.  Pulmonary:     Effort: Pulmonary effort is normal. No respiratory distress.  Musculoskeletal:     Comments: Right proximal leg -no appreciable swelling.  Patient dorsal tenderness of the posterior lateral proximal thigh.  Neurological:     Mental Status: She is alert.  Psychiatric:        Mood and Affect: Mood normal.        Behavior: Behavior normal.    UC Treatments / Results  Labs (all labs ordered are listed, but only abnormal results are displayed) Labs Reviewed - No data to display  EKG   Radiology No results found.  Procedures Procedures (including critical care time)  Medications Ordered in UC Medications - No data to display  Initial Impression / Assessment and Plan / UC Course  I have reviewed the triage vital signs and the nursing notes.  Pertinent labs & imaging results that were available during my care of the patient were reviewed by me and considered in my medical decision making (see chart for details).    73 year old female presents with leg pain.  Patient is not having any back pain or any findings that would warrant imaging.  Appears very well and is pleasant.  Patient got on the exam  table without difficulty or assistance and appeared to do so easily.  I have advised her that I believe that  this is simply a muscle strain.  Advised heat.  Naproxen as directed.  If persists, should see orthopedics and/or primary care.  Final Clinical Impressions(s) / UC Diagnoses   Final diagnoses:  Pain of right lower extremity     Discharge Instructions     Try heat.  Medication as prescribed.  If persists, see Ortho.  Take care  Dr. Adriana Simas    ED Prescriptions    Medication Sig Dispense Auth. Provider   naproxen (NAPROSYN) 500 MG tablet Take 1 tablet (500 mg total) by mouth 2 (two) times daily as needed for moderate pain. 30 tablet Tommie Sams, DO     PDMP not reviewed this encounter.   Tommie Sams, [place] 09/21/19 1548

## 2019-09-21 NOTE — ED Triage Notes (Signed)
Patient c/o right outer thigh pain that started Saturday when she reached for something and she felt something pop in her right thigh area. Patient states she has been taking Tylenol 1000mg , used ice, rest and no relief.

## 2020-04-30 ENCOUNTER — Other Ambulatory Visit: Payer: Self-pay

## 2020-04-30 ENCOUNTER — Encounter: Payer: Self-pay | Admitting: Emergency Medicine

## 2020-04-30 ENCOUNTER — Ambulatory Visit (INDEPENDENT_AMBULATORY_CARE_PROVIDER_SITE_OTHER): Payer: Medicare Other

## 2020-04-30 ENCOUNTER — Ambulatory Visit
Admission: EM | Admit: 2020-04-30 | Discharge: 2020-04-30 | Disposition: A | Discharge status: Home / Self Care | Payer: Medicare Other | Attending: Urgent Care | Admitting: Urgent Care

## 2020-04-30 DIAGNOSIS — S8001XA Contusion of right knee, initial encounter: Secondary | ICD-10-CM

## 2020-04-30 DIAGNOSIS — W19XXXA Unspecified fall, initial encounter: Secondary | ICD-10-CM

## 2020-04-30 MED ORDER — TRAMADOL HCL 50 MG PO TABS: 50.0000 mg | ORAL_TABLET | Freq: Two times a day (BID) | ORAL | 0 refills | Status: DC | PRN

## 2020-04-30 MED ORDER — MELOXICAM 15 MG PO TABS: 15.0000 mg | ORAL_TABLET | Freq: Every day | ORAL | 0 refills | Status: DC

## 2020-04-30 MED ORDER — BACITRACIN ZINC 500 UNIT/GM EX OINT: TOPICAL_OINTMENT | Freq: Once | CUTANEOUS | Status: AC

## 2020-04-30 NOTE — ED Triage Notes (Signed)
Patient c/o fall yesterday injuring her right knee. She is c/o pain and swelling to the right knee.

## 2020-04-30 NOTE — Discharge Instructions (Addendum)
It was very nice seeing you today in clinic. Thank you for entrusting me with your care.   Rest, ice, and elevate knee. Wear brace for comfort. Use anti-inflammatory medications daily. May use stronger pain medication as needed.   Make arrangements to follow up with your regular doctor in 1 week for re-evaluation if not improving. If your symptoms/condition worsens, please seek follow up care either here or in the ER. Please remember, our [facility] providers are "right here with you" when you need [place].   Again, it was my pleasure to take care of you today. Thank you for choosing our clinic. I hope that you start to feel better quickly.   Quentin Mulling, MSN, APRN, FNP-C, CEN Advanced Practice Provider [facility]

## 2020-04-30 NOTE — ED Provider Notes (Signed)
[place], [place]   Name: Tamara [name] DOB: [birth year] MRN: [id number] CSN: [id number] PCP: [facility]  Arrival date and time:  04/30/20 8185  Chief Complaint:  Knee Pain  NOTE: Prior to seeing the patient today, I have reviewed the triage nursing documentation and vital signs. Clinical staff has updated patient's PMH/PSHx, current medication list, and drug allergies/intolerances to ensure comprehensive history available to assist in medical decision making.   History:   HPI: Tamara [name] is a 74 y.o. female who presents today with complaints of pain and swelling to her RIGHT knee following a mechanical fall that occurred yesterday (04/29/2020). Patient reports that she has had balance issues following previous surgeries on her spine; uses rolling walker for ambulation assistance. Patient states, "I have taken all of the acetaminophen that I can take, but it isn't helping". Patient notes intense pain last night that prevented her from sleeping. Patient with small open skin tear overlying her patella. There is bruising noted to the medical joint line. She denies previous injuries/surgeries to her knee. Patient with no distal weakness or reported paraesthesias. ROM limited to reported pain rated 8/10. Distal sensation is intact and normal.   Past Medical History:  Diagnosis Date   Depression    Diabetes mellitus without complication ([place])    Hypertension     Past Surgical History:  Procedure Laterality Date   ACHILLES TENDON REPAIR     BACK SURGERY     HERNIA REPAIR     SHOULDER SURGERY      Family History  Adopted: Yes  Problem Relation Age of Onset   Cancer Mother    Diabetes Mother     Social History   Tobacco Use   Smoking status: Never Smoker   Smokeless tobacco: Never Used  Substance Use Topics   Alcohol use: No   Drug use: No    There are no problems to display for this patient.   Home Medications:    Current Meds  Medication Sig    acetaminophen (TYLENOL) 325 MG tablet Take 650 mg by mouth every 6 (six) hours as needed.   aspirin 81 MG chewable tablet Chew by mouth daily.   atorvastatin (LIPITOR) 10 MG tablet Take 10 mg by mouth daily.   BuPROPion HCl ER, XL, 450 MG TB24 Take 1 tablet by mouth daily.   DULoxetine (CYMBALTA) 60 MG capsule    insulin aspart protamine- aspart (NOVOLOG MIX 70/30) (70-30) 100 UNIT/ML injection Inject 32 Units into the skin 2 (two) times daily with a meal.    levothyroxine (SYNTHROID, LEVOTHROID) 125 MCG tablet Take 125 mcg by mouth daily before breakfast.   metFORMIN (GLUCOPHAGE-XR) 500 MG 24 hr tablet Take 1 tablet by mouth 2 (two) times daily.   propranolol (INDERAL) 40 MG tablet Take 40 mg by mouth 2 (two) times daily.   TRULICITY 1.5 UD/1.4HF SOPN Inject 1.5 mg into the skin once a week.   valsartan (DIOVAN) 80 MG tablet Take 40 mg by mouth daily.     Allergies:   Ace inhibitors  Review of Systems (ROS):  Review of systems NEGATIVE unless otherwise noted in narrative H&P section.   Vital Signs: Today's Vitals   04/30/20 1003 04/30/20 1004 04/30/20 1105  BP:  130/80   Pulse:  69   Resp:  18   Temp:  98.2 F (36.8 C)   TempSrc:  Oral   SpO2:  97%   Weight: 191 lb (86.6 kg)    Height: 5\' 7"  (  1.702 m)    PainSc: 8   8     Physical Exam: Physical Exam  Constitutional: She is oriented to person, place, and time and well-developed, well-nourished, and in no distress.  HENT:  Head: Normocephalic and atraumatic.  Eyes: Pupils are equal, round, and reactive to light.  Cardiovascular: Normal rate and intact distal pulses.  Pulmonary/Chest: Effort normal. No respiratory distress.  Musculoskeletal:     Right knee: Swelling and ecchymosis present. No deformity. Decreased range of motion. Tenderness (generalized) present.     Comments: Small open skin tear overlying patella; dried blood noted.   Neurological: She is alert and oriented to person, place, and time. She  has normal sensation, normal strength, normal reflexes and intact cranial nerves. Gait (2/2 acute knee pain; ambulates with rolling walker) abnormal.  Skin: Skin is warm and dry. No rash noted. She is not diaphoretic.  Psychiatric: Mood, memory, affect and judgment normal.  Nursing note and vitals reviewed.   Urgent Care Treatments / Results:   Orders Placed This Encounter  Procedures   DG Knee Complete 4 Views Right   Apply dressing   Apply knee brace/sleeve    LABS: PLEASE NOTE: all labs that were ordered this encounter are listed, however only abnormal results are displayed. Labs Reviewed - No data to display  EKG: -None  RADIOLOGY: DG Knee Complete 4 Views Right  Result Date: 04/30/2020 CLINICAL DATA:  Fall yesterday with right knee swelling EXAM: RIGHT KNEE - COMPLETE 4+ VIEW COMPARISON:  None. FINDINGS: Medial soft tissue swelling. No acute fracture, subluxation, or joint effusion. IMPRESSION: Soft tissue swelling without fracture or malalignment. Electronically Signed   By: Marnee Spring M.D.   On: 04/30/2020 10:45    PROCEDURES: Procedures  MEDICATIONS RECEIVED THIS VISIT: Medications  bacitracin ointment ( Topical Given 04/30/20 1017)    PERTINENT CLINICAL COURSE NOTES/UPDATES:   Initial Impression / Assessment and Plan / Urgent Care Course:  Pertinent labs & imaging results that were available during my care of the patient were personally reviewed by me and considered in my medical decision making (see lab/imaging section of note for values and interpretations).  Tamara [name] is a 74 y.o. female who presents to [facility] today with complaints of Knee Pain  Patient is well appearing overall in clinic today. She does not appear to be in any acute distress. Presenting symptoms (see HPI) and exam as documented above. Exam reassuring overall. Knee stable with no evidence of laxity or malalignment. There is a small area of compromised skin integrity and an  area of ecchymosis noted. Diagnostic radiographs of the RIGHT knee revealed no acute abnormalities; no fracture, dislocation, or effusion. Suspect simple contusion and abrasion. Discussed that the absence of osseous abnormalities do not rule out a more serious issues with the knee. I explained that there could still be the potential for ligamentous and/or cartilaginous injuries that are not visible on plain radiographs. If not improving, may benefit from further assessment by orthopedics for consideration of further advanced imaging. Will pursue treatment using anti-inflammatory medication (meloxicam). She was educated on complimentary modalities to help with her pain. Patient encouraged to rest, ice, and elevate her knee. Pain severe last night; patient asking for something to have on hand for pain. Rx for Tramadol supplied as she has had this and done well with in the past.    Discussed follow up with primary care physician in 1 week for re-evaluation. I have reviewed the follow up and strict return  precautions for any new or worsening symptoms. Patient is aware of symptoms that would be deemed urgent/emergent, and would thus require further evaluation either here or in the emergency department. At the time of discharge, she verbalized understanding and consent with the discharge plan as it was reviewed with her. All questions were fielded by provider and/or clinic staff prior to patient discharge.    Final Clinical Impressions / Urgent Care Diagnoses:   Final diagnoses:  Contusion of right knee, initial encounter  Fall, initial encounter    New Prescriptions:  [place] Controlled Substance Registry consulted? Yes, I have consulted the [place] Controlled Substances Registry for this patient, and feel the risk/benefit ratio today is favorable for proceeding with this prescription for a controlled substance.   Discussed use of controlled substance medication to treat her acute pain.  o Reviewed [place] STOP Act  regulations  o Clinic does not refill controlled substances over the phone without face to face evaluation.   Safety precautions reviewed.  o Medications should not be bitten, chewed, sold, or taken with alcohol.  o Avoid use while working, driving, or operating heavy machinery.  o Side effects associated with the use of this particular medication reviewed. - Patient understands that this medication can cause CNS depression, increase her risk of falls, and even lead to overdose that may result in death, if used outside of the parameters that she and I discussed.  With all of this in mind, she knowingly accepts the risks and responsibilities associated with intended course of treatment, and elects to responsibly proceed as discussed.  Meds ordered this encounter  Medications   bacitracin ointment   meloxicam (MOBIC) 15 MG tablet    Sig: Take 1 tablet (15 mg total) by mouth daily.    Dispense:  30 tablet    Refill:  0   traMADol (ULTRAM) 50 MG tablet    Sig: Take 1 tablet (50 mg total) by mouth every 12 (twelve) hours as needed.    Dispense:  10 tablet    Refill:  0    Recommended Follow up Care:  Patient encouraged to follow up with the following provider within the specified time frame, or sooner as dictated by the severity of her symptoms. As always, she was instructed that for any urgent/emergent care needs, she should seek care either here or in the emergency department for more immediate evaluation.  Follow-up Information    [place] In 1 week.   Why: General reassessment of symptoms if not improving Contact information: [address] [place] [address] [phone]         NOTE: This note was prepared using Dragon dictation software along with smaller phrase technology. Despite my best ability to proofread, there is the potential that transcriptional errors may still occur from this process, and are completely unintentional.    Verlee Monte,  NP 05/01/20 [phone]

## 2020-09-05 ENCOUNTER — Other Ambulatory Visit: Payer: Self-pay

## 2020-09-05 ENCOUNTER — Ambulatory Visit (INDEPENDENT_AMBULATORY_CARE_PROVIDER_SITE_OTHER): Payer: Medicare Other

## 2020-09-05 ENCOUNTER — Other Ambulatory Visit (INDEPENDENT_AMBULATORY_CARE_PROVIDER_SITE_OTHER): Payer: Self-pay | Admitting: Family Medicine

## 2020-09-05 DIAGNOSIS — R0989 Other specified symptoms and signs involving the circulatory and respiratory systems: Secondary | ICD-10-CM

## 2020-11-30 DIAGNOSIS — Z794 Long term (current) use of insulin: Secondary | ICD-10-CM | POA: Insufficient documentation

## 2020-11-30 DIAGNOSIS — K219 Gastro-esophageal reflux disease without esophagitis: Secondary | ICD-10-CM | POA: Diagnosis present

## 2020-11-30 DIAGNOSIS — Z79899 Other long term (current) drug therapy: Secondary | ICD-10-CM | POA: Insufficient documentation

## 2020-11-30 DIAGNOSIS — E669 Obesity, unspecified: Secondary | ICD-10-CM | POA: Insufficient documentation

## 2020-11-30 DIAGNOSIS — E282 Polycystic ovarian syndrome: Secondary | ICD-10-CM | POA: Insufficient documentation

## 2020-11-30 DIAGNOSIS — M1990 Unspecified osteoarthritis, unspecified site: Secondary | ICD-10-CM | POA: Insufficient documentation

## 2020-11-30 DIAGNOSIS — Z7984 Long term (current) use of oral hypoglycemic drugs: Secondary | ICD-10-CM | POA: Insufficient documentation

## 2020-11-30 DIAGNOSIS — F32A Depression, unspecified: Secondary | ICD-10-CM | POA: Insufficient documentation

## 2020-11-30 DIAGNOSIS — E7800 Pure hypercholesterolemia, unspecified: Secondary | ICD-10-CM | POA: Insufficient documentation

## 2020-11-30 DIAGNOSIS — I129 Hypertensive chronic kidney disease with stage 1 through stage 4 chronic kidney disease, or unspecified chronic kidney disease: Secondary | ICD-10-CM | POA: Insufficient documentation

## 2020-11-30 DIAGNOSIS — E119 Type 2 diabetes mellitus without complications: Secondary | ICD-10-CM

## 2020-12-31 ENCOUNTER — Encounter: Payer: Self-pay | Admitting: Emergency Medicine

## 2020-12-31 ENCOUNTER — Other Ambulatory Visit: Payer: Self-pay

## 2020-12-31 ENCOUNTER — Ambulatory Visit
Admission: EM | Admit: 2020-12-31 | Discharge: 2020-12-31 | Disposition: A | Discharge status: Home / Self Care | Payer: Medicare Other | Attending: Family Medicine | Admitting: Family Medicine

## 2020-12-31 DIAGNOSIS — W540XXA Bitten by dog, initial encounter: Secondary | ICD-10-CM

## 2020-12-31 DIAGNOSIS — S61532A Puncture wound without foreign body of left wrist, initial encounter: Secondary | ICD-10-CM | POA: Diagnosis not present

## 2020-12-31 MED ORDER — AMOXICILLIN-POT CLAVULANATE 875-125 MG PO TABS: 1.0000 | ORAL_TABLET | Freq: Two times a day (BID) | ORAL | 0 refills | Status: DC

## 2020-12-31 NOTE — ED Provider Notes (Signed)
[facility]    CSN: [id number] Arrival date & time: 12/31/20  0913      History   Chief Complaint Chief Complaint  Patient presents with  . Animal Bite   HPI   75 year old female presents with the above complaint.  Patient states that her dog recently had surgery.  She picked her up from the vet yesterday and after picking her up the dog seem to be in pain and subsequently bit her.  Dog is up-to-date on immunizations.  She has a wound to her left wrist.  Mild swelling.  No reported drainage.  Pain currently 7/10 in severity.  Last tetanus was in 2017.  Past Medical History:  Diagnosis Date  . Depression   . Diabetes mellitus without complication (H[facility])   . Hypertension    Past Surgical History:  Procedure Laterality Date  . ACHILLES TENDON REPAIR    . BACK SURGERY    . HERNIA REPAIR    . SHOULDER SURGERY      OB History   No obstetric history on file.      Home Medications    Prior to Admission medications   Medication Sig Start Date End Date Taking? Authorizing Provider  acetaminophen (TYLENOL) 325 MG tablet Take 650 mg by mouth every 6 (six) hours as needed.   Yes [provider]  amoxicillin-clavulanate (AUGMENTIN) 875-125 MG tablet Take 1 tablet by mouth 2 (two) times daily. 12/31/20  Yes Tommie Sams, DO  aspirin 81 MG chewable tablet Chew by mouth daily.   Yes [provider]  atorvastatin (LIPITOR) 10 MG tablet Take 10 mg by mouth daily.   Yes [provider]  BuPROPion HCl ER, XL, 450 MG TB24 Take 1 tablet by mouth daily.   Yes [provider]  DULoxetine (CYMBALTA) 60 MG capsule  12/26/18  Yes [provider]  insulin aspart protamine- aspart (NOVOLOG MIX 70/30) (70-30) 100 UNIT/ML injection Inject 32 Units into the skin 2 (two) times daily with a meal.    Yes [provider]  levothyroxine (SYNTHROID, LEVOTHROID) 125 MCG tablet Take 125 mcg by mouth daily before breakfast.   Yes [provider]  meloxicam (MOBIC) 15 MG tablet Take 1 tablet (15 mg total) by mouth daily. 04/30/20  Yes Verlee Monte, NP  metFORMIN (GLUCOPHAGE-XR) 500 MG 24 hr tablet Take 1 tablet by mouth 2 (two) times daily. 12/09/17  Yes [provider]  mupirocin ointment (BACTROBAN) 2 % Apply 1 application topically 3 (three) times daily. 01/13/19  Yes Domenick Gong, MD  propranolol (INDERAL) 40 MG tablet Take 40 mg by mouth 2 (two) times daily.   Yes [provider]  traMADol (ULTRAM) 50 MG tablet Take 1 tablet (50 mg total) by mouth every 12 (twelve) hours as needed. 04/30/20  Yes Verlee Monte, NP  TRULICITY 1.5 MG/0.5ML SOPN Inject 1.5 mg into the skin once a week. 10/28/17  Yes [provider]  valsartan (DIOVAN) 80 MG tablet Take 40 mg by mouth daily.    Yes [provider]  methylphenidate (RITALIN) 10 MG tablet  08/15/18 09/21/19  [provider]    Family History Family History  Adopted: Yes  Problem Relation Age of Onset  . Cancer Mother   . Diabetes Mother     Social History Social History   Tobacco Use  . Smoking status: Never Smoker  . Smokeless tobacco: Never Used  Vaping Use  . Vaping Use: Never used  Substance  Use Topics  . Alcohol use: No  . Drug use: No     Allergies   Ace inhibitors   Review of Systems Review of Systems  Constitutional: Negative for fever.  Skin: Positive for wound.   Physical Exam Triage Vital Signs ED Triage Vitals  Enc Vitals Group     BP 12/31/20 0933 (!) 131/91     Pulse Rate 12/31/20 0933 92     Resp 12/31/20 0933 18     Temp 12/31/20 0933 98 F (36.7 C)     Temp Source 12/31/20 0933 Oral     SpO2 12/31/20 0933 100 %     Weight 12/31/20 0931 190 lb 14.7 oz (86.6 kg)     Height 12/31/20 0931 5\' 7"  (1.702 m)     Head Circumference --      Peak Flow --      Pain Score 12/31/20 0931 7     Pain Loc --      Pain Edu? --      Excl. in GC? --    Updated Vital Signs BP (!) 131/91 (BP  Location: Right Arm)   Pulse 92   Temp 98 F (36.7 C) (Oral)   Resp 18   Ht 5\' 7"  (1.702 m)   Wt 86.6 kg   SpO2 100%   BMI 29.90 kg/m   Visual Acuity Right Eye Distance:   Left Eye Distance:   Bilateral Distance:    Right Eye Near:   Left Eye Near:    Bilateral Near:     Physical Exam Vitals and nursing note reviewed.  Constitutional:      General: She is not in acute distress.    Appearance: Normal appearance. She is not ill-appearing.  HENT:     Head: Normocephalic and atraumatic.  Eyes:     General:        Right eye: No discharge.        Left eye: No discharge.     Conjunctiva/sclera: Conjunctivae normal.  Pulmonary:     Effort: Pulmonary effort is normal. No respiratory distress.  Skin:    Comments: Left wrist with 3 puncture wound.  Mild swelling.  No significant erythema.  No drainage.  Neurological:     Mental Status: She is alert.  Psychiatric:        Mood and Affect: Mood normal.        Behavior: Behavior normal.    UC Treatments / Results  Labs (all labs ordered are listed, but only abnormal results are displayed) Labs Reviewed - No data to display  EKG   Radiology No results found.  Procedures Procedures (including critical care time)  Medications Ordered in UC Medications - No data to display  Initial Impression / Assessment and Plan / UC Course  I have reviewed the triage vital signs and the nursing notes.  Pertinent labs & imaging results that were available during my care of the patient were reviewed by me and considered in my medical decision making (see chart for details).    75 year old female presents with dog bite.  No evidence of infection at this time.  Mild swelling.  Placing on prophylactic Augmentin.  Advised to keep clean.  Supportive care.   Final Clinical Impressions(s) / UC Diagnoses   Final diagnoses:  Dog bite, initial encounter  Puncture wound of left wrist, initial encounter   Discharge Instructions   None     ED Prescriptions    Medication Sig Dispense Auth. Provider  amoxicillin-clavulanate (AUGMENTIN) 875-125 MG tablet Take 1 tablet by mouth 2 (two) times daily. 14 tablet Tommie Sams, DO     PDMP not reviewed this encounter.   Tommie Sams, DO 12/31/20 1009

## 2020-12-31 NOTE — ED Triage Notes (Signed)
Patient states she was bit by her dog on her left wrist yesterday while she was at the vet with her dog. Her dog is up to date on rabies vaccination.

## 2021-01-08 ENCOUNTER — Ambulatory Visit
Admission: EM | Admit: 2021-01-08 | Discharge: 2021-01-08 | Disposition: A | Discharge status: Home / Self Care | Payer: Medicare Other | Attending: Family Medicine | Admitting: Family Medicine

## 2021-01-08 ENCOUNTER — Other Ambulatory Visit: Payer: Self-pay

## 2021-01-08 ENCOUNTER — Encounter: Payer: Self-pay | Admitting: Emergency Medicine

## 2021-01-08 DIAGNOSIS — F411 Generalized anxiety disorder: Secondary | ICD-10-CM | POA: Diagnosis not present

## 2021-01-08 DIAGNOSIS — L299 Pruritus, unspecified: Secondary | ICD-10-CM

## 2021-01-08 MED ORDER — HYDROXYZINE HCL 25 MG PO TABS: 25.0000 mg | ORAL_TABLET | Freq: Three times a day (TID) | ORAL | 0 refills | Status: DC | PRN
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
## 2021-01-08 NOTE — Discharge Instructions (Signed)
Use the medication as directed.  Best of luck  Dr. Adriana Simas

## 2021-01-08 NOTE — ED Triage Notes (Signed)
Patient c/o right hand itching that started at 3am this morning. She said it lasted bout 30 minutes and then returned about 1 hour ago. She states it takes her breath away.

## 2021-03-15 ENCOUNTER — Encounter: Payer: Self-pay | Admitting: Emergency Medicine

## 2021-03-15 ENCOUNTER — Ambulatory Visit (INDEPENDENT_AMBULATORY_CARE_PROVIDER_SITE_OTHER): Payer: Medicare Other

## 2021-03-15 ENCOUNTER — Other Ambulatory Visit: Payer: Self-pay

## 2021-03-15 ENCOUNTER — Ambulatory Visit
Admission: EM | Admit: 2021-03-15 | Discharge: 2021-03-15 | Disposition: A | Discharge status: Home / Self Care | Payer: Medicare Other | Attending: Emergency Medicine | Admitting: Emergency Medicine

## 2021-03-15 DIAGNOSIS — M79671 Pain in right foot: Secondary | ICD-10-CM

## 2021-03-15 MED ORDER — OXYCODONE HCL 5 MG PO TABS: 5.0000 mg | ORAL_TABLET | ORAL | 0 refills | Status: DC | PRN

## 2021-03-15 NOTE — ED Triage Notes (Signed)
Pt c/o right foot pain. Started this morning. No known injury. Pt states the pain is on top of the foot. She has tried tylenol.

## 2021-03-15 NOTE — ED Provider Notes (Signed)
[facility] [place] [facility] [place]    Name: Tamara [name] DOB: 1946-03-24 MRN: [id number] CSN: [id number] PCP: [facility]  Arrival date and time:  03/15/21 1433  Chief Complaint:  Foot Pain (right)   NOTE: Prior to seeing the patient today, I have reviewed the triage nursing documentation and vital signs. Clinical staff has updated patient's PMH/PSHx, current medication list, and drug allergies/intolerances to ensure comprehensive history available to assist in medical decision making.   History:   HPI: Tamara [name] is a 75 y.o. female who presents today with complaints of right foot pain.  She states this pain started this morning upon awakening.  She denies any known injury or trauma to her foot.  She is able to ambulate, but pain is felt with every step.  She has tried over-the-counter medications (unsure if it is acetaminophen or ibuprofen), and has not noticed any change in her pain level.  She denies any swelling or warmth to the area.   Past Medical History:  Diagnosis Date  . Depression   . Diabetes mellitus without complication (HCC)   . Hypertension     Past Surgical History:  Procedure Laterality Date  . ACHILLES TENDON REPAIR    . BACK SURGERY    . HERNIA REPAIR    . SHOULDER SURGERY      Family History  Adopted: Yes  Problem Relation Age of Onset  . Cancer Mother   . Diabetes Mother     Social History   Tobacco Use  . Smoking status: Never Smoker  . Smokeless tobacco: Never Used  Vaping Use  . Vaping Use: Never used  Substance Use Topics  . Alcohol use: No  . Drug use: No    There are no problems to display for this patient.   Home Medications:    Current Meds  Medication Sig  . acetaminophen (TYLENOL) 325 MG tablet Take 650 mg by mouth every 6 (six) hours as needed.  Marland Kitchen aspirin 81 MG chewable tablet Chew by mouth daily.  Marland Kitchen atorvastatin (LIPITOR) 10 MG tablet Take 10 mg by mouth daily.  . BuPROPion HCl ER, XL, 450 MG TB24  Take 1 tablet by mouth daily.  . hydrOXYzine (ATARAX/VISTARIL) 25 MG tablet Take 1 tablet (25 mg total) by mouth every 8 (eight) hours as needed for anxiety or itching.  . insulin aspart protamine- aspart (NOVOLOG MIX 70/30) (70-30) 100 UNIT/ML injection Inject 32 Units into the skin 2 (two) times daily with a meal.   . levothyroxine (SYNTHROID, LEVOTHROID) 125 MCG tablet Take 125 mcg by mouth daily before breakfast.  . meloxicam (MOBIC) 15 MG tablet Take 1 tablet (15 mg total) by mouth daily.  . metFORMIN (GLUCOPHAGE-XR) 500 MG 24 hr tablet Take 1 tablet by mouth 2 (two) times daily.  . propranolol (INDERAL) 40 MG tablet Take 40 mg by mouth 2 (two) times daily.  . TRULICITY 1.5 MG/0.5ML SOPN Inject 1.5 mg into the skin once a week.  . valsartan (DIOVAN) 80 MG tablet Take 40 mg by mouth daily.     Allergies:   Ace inhibitors  Review of Systems (ROS): Review of Systems  Musculoskeletal: Positive for arthralgias. Negative for gait problem, joint swelling and myalgias.  All other systems reviewed and are negative.    Vital Signs: Today's Vitals   03/15/21 1445 03/15/21 1447  BP:  (!) 145/80  Pulse:  82  Resp:  18  Temp:  98.4 F (36.9 C)  TempSrc:  Oral  SpO2:  98%  Weight: 190 lb 14.7 oz (86.6 kg)   Height: 5\' 7"  (1.702 m)   PainSc: 8      Physical Exam: Physical Exam Vitals and nursing note reviewed.  Constitutional:      Appearance: Normal appearance.  Cardiovascular:     Rate and Rhythm: Normal rate and regular rhythm.     Pulses: Normal pulses.          Dorsalis pedis pulses are 2+ on the right side and 2+ on the left side.       Posterior tibial pulses are 2+ on the right side and 2+ on the left side.     Heart sounds: Normal heart sounds.  Pulmonary:     Effort: Pulmonary effort is normal.     Breath sounds: Normal breath sounds.  Musculoskeletal:     Right foot: Normal range of motion. Tenderness and bony tenderness present. No crepitus.     Comments: No pain  with active or passive range of motion.  Skin:    General: Skin is warm and dry.  Neurological:     General: No focal deficit present.     Mental Status: She is alert and oriented to person, place, and time.  Psychiatric:        Mood and Affect: Mood normal.        Behavior: Behavior normal.      Urgent Care Treatments / Results:   LABS: PLEASE NOTE: all labs that were ordered this encounter are listed, however only abnormal results are displayed. Labs Reviewed - No data to display  EKG: -None  RADIOLOGY: DG Foot Complete Right  Result Date: 03/15/2021 CLINICAL DATA:  Anterior foot pain. No known injury. History of diabetes. EXAM: RIGHT FOOT COMPLETE - 3+ VIEW COMPARISON:  None. FINDINGS: Osseous alignment is normal. Bone mineralization appears normal. No fracture line or displaced fracture fragment. No acute-appearing cortical irregularity or osseous lesion. Incidental note made of chronic spurring at the plantar margin of the posterior calcaneus. Soft tissues about the RIGHT foot are unremarkable. IMPRESSION: 1. No acute findings. 2. Chronic spurring at the plantar margin of the posterior calcaneus. Electronically Signed   By: 03/17/2021 M.D.   On: 03/15/2021 15:33    PROCEDURES: Procedures  MEDICATIONS RECEIVED THIS VISIT: Medications - No data to display  PERTINENT CLINICAL COURSE NOTES/UPDATES:   Initial Impression / Assessment and Plan / Urgent Care Course:  Pertinent labs & imaging results that were available during my care of the patient were personally reviewed by me and considered in my medical decision making (see lab/imaging section of note for values and interpretations).  Tamara [name] is a 75 y.o. female who presents to [facility] today with complaints of right foot pain, diagnosed with the same, and treated as such with the medications below. NP and patient reviewed discharge instructions below during visit.   Patient is well appearing overall in  clinic today. She does not appear to be in any acute distress. Presenting symptoms (see HPI) and exam as documented above.   I have reviewed the follow up and strict return precautions for any new or worsening symptoms. Patient is aware of symptoms that would be deemed urgent/emergent, and would thus require further evaluation either here or in the emergency department. At the time of discharge, she verbalized understanding and consent with the discharge plan as it was reviewed with her. All questions were fielded by provider and/or clinic staff prior to patient discharge.  Final Clinical Impressions / Urgent Care Diagnoses:   Final diagnoses:  Foot pain, right    New Prescriptions:  [place] Controlled Substance Registry consulted? Yes, I have consulted the [place] Controlled Substances Registry for this patient, and feel the risk/benefit ratio today is favorable for proceeding with this prescription for a controlled substance.  Meds ordered this encounter  Medications  . oxyCODONE (OXY IR/ROXICODONE) 5 MG immediate release tablet    Sig: Take 1 tablet (5 mg total) by mouth every 4 (four) hours as needed for severe pain.    Dispense:  10 tablet    Refill:  0      Discharge Instructions     You were seen for pain to right foot and are being treated for the same.   Follow-up with an orthopedist as soon as possible finds no fractures or abnormalities were seen in your foot x-ray.  Treat your pain with over-the-counter high-dose Tylenol (1000 mg 3 times a day) or 600 mg (3 tablets) of ibuprofen a day with food.  You are being prescribed a narcotic medication. This is only to be taken for breakthrough pain (the ibuprofen/tylenol isn't working). Make sure you keep it in a safe place.   Take care, Dr. Sharlet Salina, NP-c      Recommended Follow up Care:  Patient encouraged to follow up with the following provider within the specified time frame, or sooner as dictated by the severity of her  symptoms. As always, she was instructed that for any urgent/emergent care needs, she should seek care either here or in the emergency department for more immediate evaluation.   Bailey Mech, DNP, NP-c   Bailey Mech, NP 03/15/21 1549

## 2021-03-15 NOTE — Discharge Instructions (Addendum)
You were seen for pain to right foot and are being treated for the same.   Follow-up with an orthopedist as soon as possible finds no fractures or abnormalities were seen in your foot x-ray.  Treat your pain with over-the-counter high-dose Tylenol (1000 mg 3 times a day) or 600 mg (3 tablets) of ibuprofen a day with food.  You are being prescribed a narcotic medication. This is only to be taken for breakthrough pain (the ibuprofen/tylenol isn't working). Make sure you keep it in a safe place.   Take care, Dr. Sharlet Salina, NP-c

## 2021-03-30 DIAGNOSIS — R296 Repeated falls: Secondary | ICD-10-CM

## 2021-03-31 DIAGNOSIS — R471 Dysarthria and anarthria: Secondary | ICD-10-CM | POA: Insufficient documentation

## 2021-04-07 DIAGNOSIS — M4714 Other spondylosis with myelopathy, thoracic region: Secondary | ICD-10-CM | POA: Insufficient documentation

## 2021-04-07 DIAGNOSIS — R471 Dysarthria and anarthria: Secondary | ICD-10-CM | POA: Insufficient documentation

## 2021-04-07 DIAGNOSIS — G3184 Mild cognitive impairment, so stated: Secondary | ICD-10-CM | POA: Insufficient documentation

## 2021-04-08 DIAGNOSIS — Z8673 Personal history of transient ischemic attack (TIA), and cerebral infarction without residual deficits: Secondary | ICD-10-CM | POA: Insufficient documentation

## 2021-04-11 DIAGNOSIS — Z6831 Body mass index (BMI) 31.0-31.9, adult: Secondary | ICD-10-CM | POA: Insufficient documentation

## 2021-05-16 DIAGNOSIS — F419 Anxiety disorder, unspecified: Secondary | ICD-10-CM | POA: Insufficient documentation

## 2021-05-16 DIAGNOSIS — R413 Other amnesia: Secondary | ICD-10-CM | POA: Insufficient documentation

## 2021-06-05 ENCOUNTER — Ambulatory Visit
Admission: RE | Admit: 2021-06-05 | Discharge: 2021-06-05 | Disposition: A | Discharge status: Home / Self Care | Payer: Medicare Other | Attending: Family Medicine | Admitting: Family Medicine

## 2021-06-05 ENCOUNTER — Ambulatory Visit
Admission: RE | Admit: 2021-06-05 | Discharge: 2021-06-05 | Disposition: A | Discharge status: Home / Self Care | Payer: Medicare Other | Source: Ambulatory Visit | Attending: Family Medicine | Admitting: Family Medicine

## 2021-06-05 ENCOUNTER — Other Ambulatory Visit: Payer: Self-pay | Admitting: Family Medicine

## 2021-06-05 ENCOUNTER — Other Ambulatory Visit: Payer: Self-pay

## 2021-06-05 DIAGNOSIS — Z043 Encounter for examination and observation following other accident: Secondary | ICD-10-CM | POA: Insufficient documentation

## 2021-06-05 DIAGNOSIS — W19XXXA Unspecified fall, initial encounter: Secondary | ICD-10-CM | POA: Insufficient documentation

## 2021-07-11 DIAGNOSIS — M4712 Other spondylosis with myelopathy, cervical region: Secondary | ICD-10-CM | POA: Insufficient documentation

## 2022-01-28 ENCOUNTER — Other Ambulatory Visit: Payer: Self-pay | Admitting: Physician Assistant

## 2022-01-28 DIAGNOSIS — R32 Unspecified urinary incontinence: Secondary | ICD-10-CM

## 2022-01-28 DIAGNOSIS — R251 Tremor, unspecified: Secondary | ICD-10-CM

## 2022-01-28 DIAGNOSIS — G3184 Mild cognitive impairment, so stated: Secondary | ICD-10-CM

## 2022-02-11 ENCOUNTER — Ambulatory Visit: Admission: RE | Admit: 2022-02-11 | Payer: TRICARE For Life (TFL) | Source: Ambulatory Visit

## 2022-02-23 ENCOUNTER — Ambulatory Visit
Admission: RE | Admit: 2022-02-23 | Discharge: 2022-02-23 | Disposition: A | Discharge status: Home / Self Care | Payer: Medicare Other | Source: Ambulatory Visit | Attending: Physician Assistant | Admitting: Physician Assistant

## 2022-02-23 ENCOUNTER — Other Ambulatory Visit: Payer: Self-pay

## 2022-02-23 DIAGNOSIS — G3184 Mild cognitive impairment, so stated: Secondary | ICD-10-CM | POA: Diagnosis not present

## 2022-02-23 DIAGNOSIS — R251 Tremor, unspecified: Secondary | ICD-10-CM | POA: Diagnosis present

## 2022-02-23 DIAGNOSIS — R32 Unspecified urinary incontinence: Secondary | ICD-10-CM | POA: Diagnosis present

## 2022-02-27 ENCOUNTER — Other Ambulatory Visit: Payer: Self-pay

## 2022-02-27 ENCOUNTER — Encounter: Payer: Self-pay | Admitting: Emergency Medicine

## 2022-02-27 ENCOUNTER — Ambulatory Visit
Admission: EM | Admit: 2022-02-27 | Discharge: 2022-02-27 | Disposition: A | Discharge status: Home / Self Care | Payer: Medicare Other | Attending: Emergency Medicine | Admitting: Emergency Medicine

## 2022-02-27 DIAGNOSIS — E162 Hypoglycemia, unspecified: Secondary | ICD-10-CM

## 2022-02-27 DIAGNOSIS — M542 Cervicalgia: Secondary | ICD-10-CM | POA: Diagnosis present

## 2022-02-27 LAB — GLUCOSE, CAPILLARY: Glucose-Capillary: 51 mg/dL — ABNORMAL LOW (ref 70–99)

## 2022-02-27 MED ORDER — BACLOFEN 5 MG PO TABS: 5.0000 mg | ORAL_TABLET | Freq: Every day | ORAL | 0 refills | Status: DC

## 2022-02-27 MED ORDER — MELOXICAM 15 MG PO TABS: 15.0000 mg | ORAL_TABLET | Freq: Every day | ORAL | 0 refills | Status: DC

## 2022-02-27 MED ORDER — METHYLPREDNISOLONE SODIUM SUCC 40 MG IJ SOLR
60.0000 mg | Freq: Once | INTRAMUSCULAR | Status: AC
  Administered 2022-02-27: 60 mg via INTRAMUSCULAR

## 2022-02-27 MED ORDER — GLUCOSE 4 G PO CHEW
1.0000 | CHEWABLE_TABLET | Freq: Once | ORAL | Status: AC
Start: 2022-02-27 — End: 2022-02-27
  Administered 2022-02-27: 4 g via ORAL

## 2022-02-27 NOTE — Discharge Instructions (Addendum)
Your pain is most likely caused by irritation to the muscles or ligaments.  ? ?Take meloxicam every morning with food for the next 7 days then you may use medication as needed, this medication is to reduce inflammation ? ?You may use muscle relaxer for additional comfort at bedtime ? ?You may use heating pad in 15 minute intervals as needed for additional comfort, or you may find comfort in using ice in 10-15 minutes over affected area ? ?Begin stretching affected area daily for 10 minutes as tolerated to further loosen muscles  ? ?When lying down place pillow behind neck and shoulders ? ?Can try sleeping without pillow on firm mattress  ? ?Practice good posture: head back, shoulders back, chest forward, pelvis back and weight distributed evenly on both legs ? ?If pain persist after recommended treatment or reoccurs if may be beneficial to follow up with orthopedic specialist for evaluation, this doctor specializes in the bones and can manage your symptoms long-term with options such as but not limited to imaging, medications or physical therapy  ? ? ?Please alert your endocrinologist that your blood sugar has continued to be below 70 ?  ?

## 2022-02-27 NOTE — ED Triage Notes (Signed)
Patient c/o right sided neck pain for the past 3 days.  Patient states that she was seen by a provider yesterday and was not given any medicine.  Patient states that she needs something for pain because the Tylenol is not working.  ?

## 2022-02-27 NOTE — ED Provider Notes (Signed)
?[facility] ? ? ? ?CSN: [id number] ?Arrival date & time: 02/27/22  1248 ? ? ?  ? ?History   ?Chief Complaint ?Chief Complaint  ?Patient presents with  ? Neck Pain  ? ? ?HPI ?Tamara [name] is a 76 y.o. female.  ? ?Patient presents with right-sided neck pain starting along the nape of the neck radiating down.  Endorses that symptoms worsened overnight interfering with sleep.  Symptoms are worsened by movement.  Range of motion intact but elicits pain.  Denies numbness, tingling, precipitating event or trauma.  Has attempted use of Tylenol, Aleve, pain patches with no relief.  Was evaluated by her PCP 1 day ago but was not given any medication.  History of hypertension, diabetes. ? ?Requesting blood sugar to be checked as she did not take at home this morning.  Patient endorses that within the last 1 to 2 weeks that her blood sugars have been below 70, has notified her endocrinologist.  Denies lethargy, weakness, diaphoresis, dizziness, lightheadedness, visual changes.  ? ? ? ?Past Medical History:  ?Diagnosis Date  ? Depression   ? Diabetes mellitus without complication (HCC)   ? Hypertension   ? ? ?There are no problems to display for this patient. ? ? ?Past Surgical History:  ?Procedure Laterality Date  ? ACHILLES TENDON REPAIR    ? BACK SURGERY    ? HERNIA REPAIR    ? SHOULDER SURGERY    ? ? ?OB History   ?No obstetric history on file. ?  ? ? ? ?Home Medications   ? ?Prior to Admission medications   ?Medication Sig Start Date End Date Taking? Authorizing Provider  ?acetaminophen (TYLENOL) 325 MG tablet Take 650 mg by mouth every 6 (six) hours as needed.    [provider]  ?aspirin 81 MG chewable tablet Chew by mouth daily.    [provider]  ?atorvastatin (LIPITOR) 10 MG tablet Take 10 mg by mouth daily.    [provider]  ?BuPROPion HCl ER, XL, 450 MG TB24 Take 1 tablet by mouth daily.    [provider]  ?DULoxetine (CYMBALTA) 60 MG capsule  12/26/18   [provider]  ?hydrOXYzine (ATARAX/VISTARIL) 25 MG tablet Take 1 tablet (25 mg total) by mouth every 8 (eight) hours as needed for anxiety or itching. 01/08/21   Tommie Sams, DO  ?insulin aspart protamine- aspart (NOVOLOG MIX 70/30) (70-30) 100 UNIT/ML injection Inject 32 Units into the skin 2 (two) times daily with a meal.     [provider]  ?levothyroxine (SYNTHROID, LEVOTHROID) 125 MCG tablet Take 125 mcg by mouth daily before breakfast.    [provider]  ?meloxicam (MOBIC) 15 MG tablet Take 1 tablet (15 mg total) by mouth daily. 04/30/20   Verlee Monte, NP  ?metFORMIN (GLUCOPHAGE-XR) 500 MG 24 hr tablet Take 1 tablet by mouth 2 (two) times daily. 12/09/17   [provider]  ?mupirocin ointment (BACTROBAN) 2 % Apply 1 application topically 3 (three) times daily. 01/13/19   Domenick Gong, MD  ?oxyCODONE (OXY IR/ROXICODONE) 5 MG immediate release tablet Take 1 tablet (5 mg total) by mouth every 4 (four) hours as needed for severe pain. 03/15/21   Bailey Mech, NP  ?propranolol (INDERAL) 40 MG tablet Take 40 mg by mouth 2 (two) times daily.    [provider]  ?TRULICITY 1.5 MG/0.5ML SOPN Inject 1.5 mg into the skin once a week. 10/28/17   [provider]  ?valsartan (DIOVAN) 80  MG tablet Take 40 mg by mouth daily.     [provider]  ?methylphenidate (RITALIN) 10 MG tablet  08/15/18 09/21/19  [provider]  ? ? ?Family History ?Family History  ?Adopted: Yes  ?Problem Relation Age of Onset  ? Cancer Mother   ? Diabetes Mother   ? ? ?Social History ?Social History  ? ?Tobacco Use  ? Smoking status: Never  ? Smokeless tobacco: Never  ?Vaping Use  ? Vaping Use: Never used  ?Substance Use Topics  ? Alcohol use: No  ? Drug use: No  ? ? ? ?Allergies   ?Ace inhibitors ? ? ?Review of Systems ?Review of Systems  ?Musculoskeletal:  Positive for neck pain.  ? ? ?Physical Exam ?Triage Vital Signs ?ED Triage Vitals  ?Enc Vitals Group  ?   BP 02/27/22 1258  (!) 160/77  ?   Pulse Rate 02/27/22 1258 67  ?   Resp 02/27/22 1258 14  ?   Temp 02/27/22 1258 97.7 ?F (36.5 ?C)  ?   Temp Source 02/27/22 1258 Oral  ?   SpO2 02/27/22 1258 98 %  ?   Weight 02/27/22 1256 166 lb (75.3 kg)  ?   Height 02/27/22 1256 5\' 7"  (1.702 m)  ?   Head Circumference --   ?   Peak Flow --   ?   Pain Score 02/27/22 1256 7  ?   Pain Loc --   ?   Pain Edu? --   ?   Excl. in GC? --   ? ?No data found. ? ?Updated Vital Signs ?BP (!) 160/77 (BP Location: Left Arm)   Pulse 67   Temp 97.7 ?F (36.5 ?C) (Oral)   Resp 14   Ht 5\' 7"  (1.702 m)   Wt 166 lb (75.3 kg)   SpO2 98%   BMI 26.00 kg/m?  ? ?Visual Acuity ?Right Eye Distance:   ?Left Eye Distance:   ?Bilateral Distance:   ? ?Right Eye Near:   ?Left Eye Near:    ?Bilateral Near:    ? ?Physical Exam ?Constitutional:   ?   Appearance: Normal appearance.  ?HENT:  ?   Head: Normocephalic.  ?Eyes:  ?   Extraocular Movements: Extraocular movements intact.  ?Neck:  ?   Comments: Unable to reproduce tenderness on exam, no ecchymosis, swelling, spasming, rigidity or deformity noted, minimal effort given to range of motion due to pain, 2+ carotid pulses ?Pulmonary:  ?   Effort: Pulmonary effort is normal.  ?Skin: ?   General: Skin is warm and dry.  ?Neurological:  ?   Mental Status: She is alert and oriented to person, place, and time. Mental status is at baseline.  ?Psychiatric:     ?   Mood and Affect: Mood normal.     ?   Behavior: Behavior normal.  ? ? ? ?UC Treatments / Results  ?Labs ?(all labs ordered are listed, but only abnormal results are displayed) ?Labs Reviewed - No data to display ? ?EKG ? ? ?Radiology ?No results found. ? ?Procedures ?Procedures (including critical care time) ? ?Medications Ordered in UC ?Medications - No data to display ? ?Initial Impression / Assessment and Plan / UC Course  ?I have reviewed the triage vital signs and the nursing notes. ? ?Pertinent labs & imaging results that were available during my care of the patient  were reviewed by me and considered in my medical decision making (see chart for details). ? ?Neck pain ?Hypoglycemia ? ?  Neck pain is most likely muscular, low suspicion of spinal involvement, will defer imaging due to lack of injury, discussed with patient, methylprednisolone injection given in office, discussed effects on blood sugar, will avoid use of oral steroid course due to history of diabetes, prescribed meloxicam and baclofen for outpatient use, recommended continued use of RICE, heat, pillows for support and topical medications for additional support, may follow-up with urgent care or PCP for persisting symptoms ? ?Point-of-care CBG 51, 4 mg glucose tablet given, patient was given steroid injection for management of pain, both should increase sugar levels, will not recheck in office and have patient notify endocrinologist of continuing hypoglycemia ?Final Clinical Impressions(s) / UC Diagnoses  ? ?Final diagnoses:  ?None  ? ?Discharge Instructions   ?None ?  ? ?ED Prescriptions   ?None ?  ? ?PDMP not reviewed this encounter. ?  ?Valinda Hoar, NP ?02/27/22 1404 ? ?

## 2022-05-08 ENCOUNTER — Other Ambulatory Visit: Payer: Self-pay

## 2022-05-08 ENCOUNTER — Encounter: Payer: Self-pay | Admitting: Emergency Medicine

## 2022-05-08 ENCOUNTER — Emergency Department: Payer: Medicare Other

## 2022-05-08 ENCOUNTER — Observation Stay
Admission: EM | Admit: 2022-05-08 | Discharge: 2022-05-10 | Disposition: A | Discharge status: Home / Self Care | Payer: Medicare Other | Attending: Internal Medicine | Admitting: Internal Medicine

## 2022-05-08 DIAGNOSIS — E11649 Type 2 diabetes mellitus with hypoglycemia without coma: Secondary | ICD-10-CM | POA: Insufficient documentation

## 2022-05-08 DIAGNOSIS — Z7985 Long-term (current) use of injectable non-insulin antidiabetic drugs: Secondary | ICD-10-CM | POA: Diagnosis not present

## 2022-05-08 DIAGNOSIS — E162 Hypoglycemia, unspecified: Secondary | ICD-10-CM | POA: Diagnosis present

## 2022-05-08 DIAGNOSIS — Z79899 Other long term (current) drug therapy: Secondary | ICD-10-CM | POA: Diagnosis not present

## 2022-05-08 DIAGNOSIS — E119 Type 2 diabetes mellitus without complications: Secondary | ICD-10-CM

## 2022-05-08 DIAGNOSIS — I129 Hypertensive chronic kidney disease with stage 1 through stage 4 chronic kidney disease, or unspecified chronic kidney disease: Secondary | ICD-10-CM | POA: Insufficient documentation

## 2022-05-08 DIAGNOSIS — N189 Chronic kidney disease, unspecified: Secondary | ICD-10-CM | POA: Diagnosis not present

## 2022-05-08 DIAGNOSIS — R296 Repeated falls: Secondary | ICD-10-CM | POA: Diagnosis not present

## 2022-05-08 DIAGNOSIS — I10 Essential (primary) hypertension: Secondary | ICD-10-CM | POA: Diagnosis present

## 2022-05-08 DIAGNOSIS — F32A Depression, unspecified: Secondary | ICD-10-CM | POA: Diagnosis present

## 2022-05-08 DIAGNOSIS — Z794 Long term (current) use of insulin: Secondary | ICD-10-CM | POA: Diagnosis not present

## 2022-05-08 DIAGNOSIS — E039 Hypothyroidism, unspecified: Secondary | ICD-10-CM | POA: Diagnosis present

## 2022-05-08 DIAGNOSIS — E1122 Type 2 diabetes mellitus with diabetic chronic kidney disease: Secondary | ICD-10-CM | POA: Insufficient documentation

## 2022-05-08 DIAGNOSIS — G9341 Metabolic encephalopathy: Secondary | ICD-10-CM | POA: Diagnosis not present

## 2022-05-08 DIAGNOSIS — R531 Weakness: Secondary | ICD-10-CM | POA: Diagnosis not present

## 2022-05-08 DIAGNOSIS — N179 Acute kidney failure, unspecified: Secondary | ICD-10-CM | POA: Diagnosis present

## 2022-05-08 DIAGNOSIS — Z7984 Long term (current) use of oral hypoglycemic drugs: Secondary | ICD-10-CM | POA: Diagnosis not present

## 2022-05-08 DIAGNOSIS — E782 Mixed hyperlipidemia: Secondary | ICD-10-CM | POA: Diagnosis present

## 2022-05-08 DIAGNOSIS — Z7982 Long term (current) use of aspirin: Secondary | ICD-10-CM | POA: Insufficient documentation

## 2022-05-08 DIAGNOSIS — D649 Anemia, unspecified: Secondary | ICD-10-CM | POA: Diagnosis present

## 2022-05-08 DIAGNOSIS — K219 Gastro-esophageal reflux disease without esophagitis: Secondary | ICD-10-CM | POA: Diagnosis present

## 2022-05-08 DIAGNOSIS — G4730 Sleep apnea, unspecified: Secondary | ICD-10-CM | POA: Diagnosis present

## 2022-05-08 DIAGNOSIS — R739 Hyperglycemia, unspecified: Secondary | ICD-10-CM | POA: Insufficient documentation

## 2022-05-08 HISTORY — DX: Hypothyroidism, unspecified: E03.9

## 2022-05-08 HISTORY — DX: Chronic kidney disease, unspecified: N18.9

## 2022-05-08 HISTORY — DX: Anemia, unspecified: D64.9

## 2022-05-08 HISTORY — DX: Gastro-esophageal reflux disease without esophagitis: K21.9

## 2022-05-08 LAB — CBC WITH DIFFERENTIAL/PLATELET
Abs Immature Granulocytes: 0.04 10*3/uL (ref 0.00–0.07)
Basophils Absolute: 0.1 10*3/uL (ref 0.0–0.1)
Basophils Relative: 1 %
Eosinophils Absolute: 0.1 10*3/uL (ref 0.0–0.5)
Eosinophils Relative: 1 %
HCT: 36.1 % (ref 36.0–46.0)
Hemoglobin: 11.5 g/dL — ABNORMAL LOW (ref 12.0–15.0)
Immature Granulocytes: 0 %
Lymphocytes Relative: 13 %
Lymphs Abs: 1.5 10*3/uL (ref 0.7–4.0)
MCH: 27.3 pg (ref 26.0–34.0)
MCHC: 31.9 g/dL (ref 30.0–36.0)
MCV: 85.7 fL (ref 80.0–100.0)
Monocytes Absolute: 0.9 10*3/uL (ref 0.1–1.0)
Monocytes Relative: 8 %
Neutro Abs: 8.8 10*3/uL — ABNORMAL HIGH (ref 1.7–7.7)
Neutrophils Relative %: 77 %
Platelets: 405 10*3/uL — ABNORMAL HIGH (ref 150–400)
RBC: 4.21 MIL/uL (ref 3.87–5.11)
RDW: 14.9 % (ref 11.5–15.5)
WBC: 11.5 10*3/uL — ABNORMAL HIGH (ref 4.0–10.5)
nRBC: 0 % (ref 0.0–0.2)

## 2022-05-08 LAB — COMPREHENSIVE METABOLIC PANEL
ALT: 15 U/L (ref 0–44)
AST: 20 U/L (ref 15–41)
Albumin: 3.7 g/dL (ref 3.5–5.0)
Alkaline Phosphatase: 72 U/L (ref 38–126)
Anion gap: 6 (ref 5–15)
BUN: 24 mg/dL — ABNORMAL HIGH (ref 8–23)
CO2: 28 mmol/L (ref 22–32)
Calcium: 9.3 mg/dL (ref 8.9–10.3)
Chloride: 101 mmol/L (ref 98–111)
Creatinine, Ser: 1.15 mg/dL — ABNORMAL HIGH (ref 0.44–1.00)
GFR, Estimated: 50 mL/min — ABNORMAL LOW (ref >60)
Glucose, Bld: 127 mg/dL — ABNORMAL HIGH (ref 70–99)
Potassium: 3.5 mmol/L (ref 3.5–5.1)
Sodium: 135 mmol/L (ref 135–145)
Total Bilirubin: 0.7 mg/dL (ref 0.3–1.2)
Total Protein: 7.2 g/dL (ref 6.5–8.1)

## 2022-05-08 LAB — URINALYSIS, ROUTINE W REFLEX MICROSCOPIC
Bacteria, UA: NONE SEEN
Bilirubin Urine: NEGATIVE
Glucose, UA: NEGATIVE mg/dL
Hgb urine dipstick: NEGATIVE
Ketones, ur: NEGATIVE mg/dL
Leukocytes,Ua: NEGATIVE
Nitrite: NEGATIVE
Protein, ur: 30 mg/dL — AB
Specific Gravity, Urine: 1.018 (ref 1.005–1.030)
pH: 5 (ref 5.0–8.0)

## 2022-05-08 LAB — LIPID PANEL
Cholesterol: 133 mg/dL (ref 0–200)
HDL: 74 mg/dL (ref >40)
LDL Cholesterol: 39 mg/dL (ref 0–99)
Total CHOL/HDL Ratio: 1.8 RATIO
Triglycerides: 99 mg/dL (ref <150)
VLDL: 20 mg/dL (ref 0–40)

## 2022-05-08 LAB — OCCULT BLOOD X 1 CARD TO LAB, STOOL: Fecal Occult Bld: NEGATIVE

## 2022-05-08 LAB — CBG MONITORING, ED
Glucose-Capillary: 127 mg/dL — ABNORMAL HIGH (ref 70–99)
Glucose-Capillary: 68 mg/dL — ABNORMAL LOW (ref 70–99)
Glucose-Capillary: 126 mg/dL — ABNORMAL HIGH (ref 70–99)
Glucose-Capillary: 130 mg/dL — ABNORMAL HIGH (ref 70–99)
Glucose-Capillary: 98 mg/dL (ref 70–99)

## 2022-05-08 LAB — GLUCOSE, CAPILLARY: Glucose-Capillary: 91 mg/dL (ref 70–99)

## 2022-05-08 LAB — T4, FREE: Free T4: 1.6 ng/dL — ABNORMAL HIGH (ref 0.61–1.12)

## 2022-05-08 LAB — TSH: TSH: 0.166 u[IU]/mL — ABNORMAL LOW (ref 0.350–4.500)

## 2022-05-08 MED ORDER — DULOXETINE HCL 20 MG PO CPEP
20.0000 mg | ORAL_CAPSULE | Freq: Every day | ORAL | Status: DC
  Administered 2022-05-08 – 2022-05-09 (×2): 20 mg via ORAL
  Filled 2022-05-08 (×2): qty 1

## 2022-05-08 MED ORDER — ATORVASTATIN CALCIUM 10 MG PO TABS
10.0000 mg | ORAL_TABLET | Freq: Every day | ORAL | Status: DC
  Administered 2022-05-09 – 2022-05-10 (×2): 10 mg via ORAL
  Filled 2022-05-08 (×2): qty 1

## 2022-05-08 MED ORDER — MORPHINE SULFATE (PF) 2 MG/ML IV SOLN: 2.0000 mg | INTRAVENOUS | Status: DC | PRN

## 2022-05-08 MED ORDER — ACETAMINOPHEN 650 MG RE SUPP
650.0000 mg | Freq: Four times a day (QID) | RECTAL | Status: DC | PRN
Start: 2022-05-08 — End: 2022-05-10

## 2022-05-08 MED ORDER — LEVOTHYROXINE SODIUM 25 MCG PO TABS
125.0000 ug | ORAL_TABLET | Freq: Every day | ORAL | Status: DC
  Administered 2022-05-09 – 2022-05-10 (×2): 125 ug via ORAL
  Filled 2022-05-08 (×2): qty 1

## 2022-05-08 MED ORDER — PANTOPRAZOLE SODIUM 40 MG IV SOLR
40.0000 mg | Freq: Two times a day (BID) | INTRAVENOUS | Status: DC
  Administered 2022-05-08 – 2022-05-09 (×3): 40 mg via INTRAVENOUS
  Filled 2022-05-08 (×3): qty 10

## 2022-05-08 MED ORDER — HYDROCODONE-ACETAMINOPHEN 5-325 MG PO TABS: 1.0000 | ORAL_TABLET | ORAL | Status: DC | PRN

## 2022-05-08 MED ORDER — DEXTROSE 50 % IV SOLN: 12.5000 g | Freq: Once | INTRAVENOUS | Status: AC

## 2022-05-08 MED ORDER — DEXTROSE 50 % IV SOLN
INTRAVENOUS | Status: AC
  Administered 2022-05-08: 12.5 g via INTRAVENOUS
  Filled 2022-05-08: qty 50

## 2022-05-08 MED ORDER — DEXTROSE 10 % IV SOLN: INTRAVENOUS | Status: DC

## 2022-05-08 MED ORDER — SODIUM CHLORIDE 0.9 % IV SOLN: INTRAVENOUS | Status: AC

## 2022-05-08 MED ORDER — SODIUM CHLORIDE 0.9% FLUSH
3.0000 mL | Freq: Two times a day (BID) | INTRAVENOUS | Status: DC
  Administered 2022-05-08 – 2022-05-09 (×3): 3 mL via INTRAVENOUS

## 2022-05-08 MED ORDER — INSULIN ASPART 100 UNIT/ML IJ SOLN
0.0000 [IU] | Freq: Three times a day (TID) | INTRAMUSCULAR | Status: DC
  Administered 2022-05-09 (×2): 3 [IU] via SUBCUTANEOUS
  Administered 2022-05-09: 5 [IU] via SUBCUTANEOUS
  Administered 2022-05-10: 2 [IU] via SUBCUTANEOUS
  Filled 2022-05-08 (×4): qty 1

## 2022-05-08 MED ORDER — ACETAMINOPHEN 325 MG PO TABS
650.0000 mg | ORAL_TABLET | Freq: Four times a day (QID) | ORAL | Status: DC | PRN
Start: 2022-05-08 — End: 2022-05-10

## 2022-05-08 NOTE — Assessment & Plan Note (Signed)
SSI A1c 

## 2022-05-08 NOTE — ED Notes (Signed)
Positive for occult bood

## 2022-05-08 NOTE — ED Notes (Signed)
Back from CT

## 2022-05-08 NOTE — Assessment & Plan Note (Signed)
Lab Results  Component Value Date   CREATININE 1.15 (H) 05/08/2022   CREATININE 1.07 (H) 06/03/2018   CREATININE 1.31 (H) 12/17/2017  avoid contrast.  Renally dose all needed meds.

## 2022-05-08 NOTE — Assessment & Plan Note (Signed)
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

## 2022-05-08 NOTE — Assessment & Plan Note (Signed)
Pt did not eat and took her insulin per report. We will get a1c and start SSI insulin. accuchecks q6 hourly.

## 2022-05-08 NOTE — Assessment & Plan Note (Addendum)
    Latest Ref Rng & Units 05/08/2022    6:01 PM 06/03/2018    7:22 PM 12/17/2017    8:43 AM  CBC  WBC 4.0 - 10.5 K/uL 11.5  15.8  10.0   Hemoglobin 12.0 - 15.0 g/dL 76.1  95.0  93.2   Hematocrit 36.0 - 46.0 % 36.1  39.9  41.9   Platelets 150 - 400 K/uL 405  346  532    Mild anemia . And guaiac is positive.  D/w pt to stop meloxicam and NSAIDS.

## 2022-05-08 NOTE — Assessment & Plan Note (Signed)
IV PPI. 

## 2022-05-08 NOTE — ED Notes (Signed)
To Ct 

## 2022-05-08 NOTE — Assessment & Plan Note (Signed)
Am lipid panel . 

## 2022-05-08 NOTE — ED Notes (Signed)
Cheri Fowler, MD aware of patient's cbg of 67

## 2022-05-08 NOTE — H&P (Signed)
History and Physical    Patient: Tamara [name] [id number] DOB: 1946/08/07 DOA: 05/08/2022 DOS: the patient was seen and examined on 05/08/2022 PCP: [facility]  Patient coming from: Home  Chief Complaint:  Chief Complaint  Patient presents with   Hypoglycemia   HPI: Tamara [name] is a 76 y.o. female with medical history significant of  Dm ii, Depression, SDH, coming for generalized weakness and hypoglycemia. EMS called for hypoglycemia. Pt received d10 per ems.   Pt is poor historian but does not report any Headache, chest pain vision chest  abdominal complaints.  Patient also does not report any fevers chills nausea vomiting abdominal pain diarrhea or any bladder complaints.  Chart review shows patient has had an MRI in March of this year which shows subdural hematoma.  Requested EDMD to obtain CT of the head.  In the emergency room patient is alert awake oriented and on D10 infusion currently.  Pt has two care givers and sometimes 3.  Sons lives in the [place].  Pt reports she falls multiple falls because of her back surgery.  Pt is oriented to person/ place or time.  Advised to avoid meloxicam to avoid bleeding and GIB. Pt takes Cymbalta for depression.   Review of Systems  Neurological:  Positive for weakness.       Falls    All other systems reviewed and are negative.   Past Medical History:  Diagnosis Date   Anemia    Chronic kidney disease    Depression    Diabetes mellitus without complication (HCC)    GERD (gastroesophageal reflux disease)    Hypertension    Hypothyroidism    Past Surgical History:  Procedure Laterality Date   ACHILLES TENDON REPAIR     BACK SURGERY     HERNIA REPAIR     SHOULDER SURGERY     Social History:  reports that she has never smoked. She has never used smokeless tobacco. She reports that she does not drink alcohol and does not use drugs.  Allergies  Allergen Reactions   Ace Inhibitors Cough    Family  History  Adopted: Yes  Problem Relation Age of Onset   Cancer Mother    Diabetes Mother     Prior to Admission medications   Medication Sig Start Date End Date Taking? Authorizing Provider  acetaminophen (TYLENOL) 325 MG tablet Take 650 mg by mouth every 6 (six) hours as needed.    [provider]  aspirin 81 MG chewable tablet Chew by mouth daily.    [provider]  atorvastatin (LIPITOR) 10 MG tablet Take 10 mg by mouth daily.    [provider]  baclofen 5 MG TABS Take 5 mg by mouth at bedtime. 02/27/22   White, Leitha Schuller, NP  BuPROPion HCl ER, XL, 450 MG TB24 Take 1 tablet by mouth daily.    [provider]  DULoxetine (CYMBALTA) 60 MG capsule  12/26/18   [provider]  hydrOXYzine (ATARAX/VISTARIL) 25 MG tablet Take 1 tablet (25 mg total) by mouth every 8 (eight) hours as needed for anxiety or itching. 01/08/21   Coral Spikes, DO  insulin aspart protamine- aspart (NOVOLOG MIX 70/30) (70-30) 100 UNIT/ML injection Inject 32 Units into the skin 2 (two) times daily with a meal.     [provider]  levothyroxine (SYNTHROID, LEVOTHROID) 125 MCG tablet Take 125 mcg by mouth daily before breakfast.    [provider]  meloxicam (MOBIC) 15 MG tablet  Take 1 tablet (15 mg total) by mouth daily. 02/27/22   White, Leitha Schuller, NP  metFORMIN (GLUCOPHAGE-XR) 500 MG 24 hr tablet Take 1 tablet by mouth 2 (two) times daily. 12/09/17   [provider]  mupirocin ointment (BACTROBAN) 2 % Apply 1 application topically 3 (three) times daily. 01/13/19   Melynda Ripple, MD  oxyCODONE (OXY IR/ROXICODONE) 5 MG immediate release tablet Take 1 tablet (5 mg total) by mouth every 4 (four) hours as needed for severe pain. 03/15/21   Gertie Baron, NP  propranolol (INDERAL) 40 MG tablet Take 40 mg by mouth 2 (two) times daily.    [provider]  TRULICITY 1.5 0000000 SOPN Inject 1.5 mg into the skin once a week. 10/28/17   [provider]  valsartan (DIOVAN) 80 MG tablet Take 40 mg by mouth daily.     [provider]  methylphenidate (RITALIN) 10 MG tablet  08/15/18 09/21/19  [provider]    Physical Exam: Vitals:   05/08/22 2100 05/08/22 2135 05/08/22 2137 05/08/22 2155  BP: 97/61 (!) 107/95 114/78 107/68  Pulse: 77 73 80 73  Resp: 16 12 11 20   Temp:  97.8 F (36.6 C)    TempSrc:  Oral    SpO2: 100% 99% 97% 100%  Weight:      Height:       Physical Exam Vitals and nursing note reviewed.  Constitutional:      General: She is not in acute distress.    Appearance: Normal appearance. She is not ill-appearing, toxic-appearing or diaphoretic.  HENT:     Head: Normocephalic and atraumatic.     Right Ear: Hearing and external ear normal.     Left Ear: Hearing and external ear normal.     Nose: Nose normal. No nasal deformity.     Mouth/Throat:     Lips: Pink.     Mouth: Mucous membranes are moist.     Tongue: No lesions.     Pharynx: Oropharynx is clear.  Eyes:     Extraocular Movements: Extraocular movements intact.     Pupils: Pupils are equal, round, and reactive to light.  Neck:     Vascular: No carotid bruit.  Cardiovascular:     Rate and Rhythm: Normal rate and regular rhythm.     Pulses: Normal pulses.     Heart sounds: Normal heart sounds.  Pulmonary:     Effort: Pulmonary effort is normal.     Breath sounds: Normal breath sounds.  Abdominal:     General: Bowel sounds are normal. There is no distension.     Palpations: Abdomen is soft. There is no mass.     Tenderness: There is no abdominal tenderness. There is no guarding.     Hernia: No hernia is present.  Musculoskeletal:     Right lower leg: No edema.     Left lower leg: No edema.  Skin:    General: Skin is warm.  Neurological:     General: No focal deficit present.     Mental Status: She is alert and oriented to person, place, and time.     Cranial Nerves: Cranial nerves 2-12 are intact.     Motor:  Motor function is intact.  Psychiatric:        Attention and Perception: Attention normal.        Mood and Affect: Mood normal.        Speech: Speech normal.  Behavior: Behavior normal. Behavior is cooperative.        Cognition and Memory: Cognition normal.     Data Reviewed: Results for orders placed or performed during the hospital encounter of 05/08/22 (from the past 24 hour(s))  CBG monitoring, ED     Status: Abnormal   Collection Time: 05/08/22  5:57 PM  Result Value Ref Range   Glucose-Capillary 68 (L) 70 - 99 mg/dL  Comprehensive metabolic panel     Status: Abnormal   Collection Time: 05/08/22  6:01 PM  Result Value Ref Range   Sodium 135 135 - 145 mmol/L   Potassium 3.5 3.5 - 5.1 mmol/L   Chloride 101 98 - 111 mmol/L   CO2 28 22 - 32 mmol/L   Glucose, Bld 127 (H) 70 - 99 mg/dL   BUN 24 (H) 8 - 23 mg/dL   Creatinine, Ser 1.15 (H) 0.44 - 1.00 mg/dL   Calcium 9.3 8.9 - 10.3 mg/dL   Total Protein 7.2 6.5 - 8.1 g/dL   Albumin 3.7 3.5 - 5.0 g/dL   AST 20 15 - 41 U/L   ALT 15 0 - 44 U/L   Alkaline Phosphatase 72 38 - 126 U/L   Total Bilirubin 0.7 0.3 - 1.2 mg/dL   GFR, Estimated 50 (L) >60 mL/min   Anion gap 6 5 - 15  CBC with Differential     Status: Abnormal   Collection Time: 05/08/22  6:01 PM  Result Value Ref Range   WBC 11.5 (H) 4.0 - 10.5 K/uL   RBC 4.21 3.87 - 5.11 MIL/uL   Hemoglobin 11.5 (L) 12.0 - 15.0 g/dL   HCT 36.1 36.0 - 46.0 %   MCV 85.7 80.0 - 100.0 fL   MCH 27.3 26.0 - 34.0 pg   MCHC 31.9 30.0 - 36.0 g/dL   RDW 14.9 11.5 - 15.5 %   Platelets 405 (H) 150 - 400 K/uL   nRBC 0.0 0.0 - 0.2 %   Neutrophils Relative % 77 %   Neutro Abs 8.8 (H) 1.7 - 7.7 K/uL   Lymphocytes Relative 13 %   Lymphs Abs 1.5 0.7 - 4.0 K/uL   Monocytes Relative 8 %   Monocytes Absolute 0.9 0.1 - 1.0 K/uL   Eosinophils Relative 1 %   Eosinophils Absolute 0.1 0.0 - 0.5 K/uL   Basophils Relative 1 %   Basophils Absolute 0.1 0.0 - 0.1 K/uL   Immature Granulocytes 0 %    Abs Immature Granulocytes 0.04 0.00 - 0.07 K/uL  CBG monitoring, ED     Status: Abnormal   Collection Time: 05/08/22  6:11 PM  Result Value Ref Range   Glucose-Capillary 127 (H) 70 - 99 mg/dL  CBG monitoring, ED     Status: Abnormal   Collection Time: 05/08/22  6:31 PM  Result Value Ref Range   Glucose-Capillary 126 (H) 70 - 99 mg/dL  CBG monitoring, ED     Status: Abnormal   Collection Time: 05/08/22  7:35 PM  Result Value Ref Range   Glucose-Capillary 130 (H) 70 - 99 mg/dL     Assessment and Plan: * Generalized weakness D/d include TIA/ CVA/ age related and back issue related to SIRS. MRI brain. Echo.  PT consult.  Thyroid studies.    Hypoglycemia Pt did not eat and took her insulin per report. We will get a1c and start SSI insulin. accuchecks q6 hourly.   Diabetes mellitus ([place]) SSI. A1c.   Falls frequently PT consult suspect pt may  be dehydrated but also have had back issues. Fall precautions.   Essential hypertension Blood pressure 107/68, pulse 73, temperature 97.8 F (36.6 C), temperature source Oral, resp. rate 20, height 5\' 7"  (1.702 m), weight 77.1 kg, SpO2 100 %. Prn hydralazine.  BP low in ED. MIVF. Orthostatic vitals.    AKI (acute kidney injury) ([place]) Lab Results  Component Value Date   CREATININE 1.15 (H) 05/08/2022   CREATININE 1.07 (H) 06/03/2018   CREATININE 1.31 (H) 12/17/2017  avoid contrast.  Renally dose all needed meds.    Anemia    Latest Ref Rng & Units 05/08/2022    6:01 PM 06/03/2018    7:22 PM 12/17/2017    8:43 AM  CBC  WBC 4.0 - 10.5 K/uL 11.5  15.8  10.0   Hemoglobin 12.0 - 15.0 g/dL 11.5  13.4  13.8   Hematocrit 36.0 - 46.0 % 36.1  39.9  41.9   Platelets 150 - 400 K/uL 405  346  532    Mild anemia . And guaiac is positive.  D/w pt to stop meloxicam and NSAIDS.  Acquired hypothyroidism Cont levothyroxine at 125 mcg. Free t4 and tsh.    Sleep apnea Pt has osa and we will start bipap.  Depression Continue meds  Cymbalta but dose decreased to 20 mg due to low BP.  Gastroesophageal reflux disease IV PPI.   Mixed hyperlipidemia Am lipid panel.      Advance Care Planning:    Code Status: Full Code   Consults:  None   Family Communication:  Johnston,Matthew (Other)  [phone] (Mobile)  Severity of Illness: The appropriate patient status for this patient is OBSERVATION. Observation status is judged to be reasonable and necessary in order to provide the required intensity of service to ensure the patient's safety. The patient's presenting symptoms, physical exam findings, and initial radiographic and laboratory data in the context of their medical condition is felt to place them at decreased risk for further clinical deterioration. Furthermore, it is anticipated that the patient will be medically stable for discharge from the hospital within 2 midnights of admission.   Author: Para Skeans, MD 05/08/2022 10:39 PM  For on call review [email or web address].

## 2022-05-08 NOTE — ED Notes (Signed)
Patient unable to stand long enough to complete orthostatic vital signs. Patient states she was feeling lightheaded while standing and very unbalanced.

## 2022-05-08 NOTE — ED Provider Notes (Signed)
[facility] Provider Note   Event Date/Time   First MD Initiated Contact with Patient 05/08/22 1742     (approximate) History  Hypoglycemia  HPI Wisdom Mcnamar is a 76 y.o. female with a stated past medical history of type 2 diabetes who presents via EMS for hypoglycemia the setting of generalized weakness.  Patient states that she has had poor p.o. intake over the past few days and despite checking her blood sugar that was found to be in the 120s, she took 20 units of long-acting insulin.  Patient was found by EMS to have sugar in the 50s and administered oral glucose.  Despite this, patient maintained and hypoglycemia glucose of 68 upon arrival.  EMS gave approximately 75 mL of D10 prior to arrival as well ROS: Patient currently denies any vision changes, tinnitus, difficulty speaking, facial droop, sore throat, chest pain, shortness of breath, abdominal pain, nausea/vomiting/diarrhea, dysuria, or numbness/paresthesias in any extremity   Physical Exam  Triage Vital Signs: ED Triage Vitals  Enc Vitals Group     BP 05/08/22 1750 123/73     Pulse Rate 05/08/22 1750 66     Resp 05/08/22 1750 18     Temp 05/08/22 1750 (!) 97.4 F (36.3 C)     Temp Source 05/08/22 1750 Oral     SpO2 05/08/22 1744 99 %     Weight 05/08/22 1751 170 lb (77.1 kg)     Height 05/08/22 1751 5\' 7"  (1.702 m)     Head Circumference --      Peak Flow --      Pain Score 05/08/22 1751 0     Pain Loc --      Pain Edu? --      Excl. in [place]? --    Most recent vital signs: Vitals:   05/08/22 1937 05/08/22 1942  BP: 100/74   Pulse: 75   Resp: 17   Temp:  (!) 97.5 F (36.4 C)  SpO2: 99%    General: Awake, oriented x4. CV:  Good peripheral perfusion.  Resp:  Normal effort.  Abd:  No distention.  Other:  Elderly overweight Caucasian female laying in bed in no acute distress.  A&O x4 ED Results / Procedures / Treatments  Labs (all labs ordered are listed, but only abnormal results are  displayed) Labs Reviewed  COMPREHENSIVE METABOLIC PANEL - Abnormal; Notable for the following components:      Result Value   Glucose, Bld 127 (*)    BUN 24 (*)    Creatinine, Ser 1.15 (*)    GFR, Estimated 50 (*)    All other components within normal limits  CBC WITH DIFFERENTIAL/PLATELET - Abnormal; Notable for the following components:   WBC 11.5 (*)    Hemoglobin 11.5 (*)    Platelets 405 (*)    Neutro Abs 8.8 (*)    All other components within normal limits  CBG MONITORING, ED - Abnormal; Notable for the following components:   Glucose-Capillary 68 (*)    All other components within normal limits  CBG MONITORING, ED - Abnormal; Notable for the following components:   Glucose-Capillary 127 (*)    All other components within normal limits  CBG MONITORING, ED - Abnormal; Notable for the following components:   Glucose-Capillary 126 (*)    All other components within normal limits  CBG MONITORING, ED - Abnormal; Notable for the following components:   Glucose-Capillary 130 (*)    All other components within normal limits  URINALYSIS, ROUTINE W REFLEX MICROSCOPIC   PROCEDURES: Critical Care performed: Yes, see critical care procedure note(s) .1-3 Lead EKG Interpretation  Performed by: Naaman Plummer, MD Authorized by: Naaman Plummer, MD     Interpretation: normal     ECG rate:  74   ECG rate assessment: normal     Rhythm: sinus rhythm     Ectopy: none     Conduction: normal   CRITICAL CARE Performed by: Naaman Plummer  Total critical care time: 37 minutes  Critical care time was exclusive of separately billable procedures and treating other patients.  Critical care was necessary to treat or prevent imminent or life-threatening deterioration.  Critical care was time spent personally by me on the following activities: development of treatment plan with patient and/or surrogate as well as nursing, discussions with consultants, evaluation of patient's response to  treatment, examination of patient, obtaining history from patient or surrogate, ordering and performing treatments and interventions, ordering and review of laboratory studies, ordering and review of radiographic studies, pulse oximetry and re-evaluation of patient's condition.  MEDICATIONS ORDERED IN ED: Medications  dextrose 10 % infusion (0 mLs Intravenous Stopped 05/08/22 1936)  dextrose 50 % solution 12.5 g (12.5 g Intravenous Given 05/08/22 1803)   IMPRESSION / MDM / ASSESSMENT AND PLAN / ED COURSE  I reviewed the triage vital signs and the nursing notes.                             The patient is on the cardiac monitor to evaluate for evidence of arrhythmia and/or significant heart rate changes. Patient's presentation is most consistent with acute presentation with potential threat to life or bodily function. Insulin Dependent Diabetic presents BIBA for hypoglycemia  Given History, Exam, and Workup I have a low suspicion for sepsis induced hypoglycemia, long lasting medication overdose, suicidal ideation/purposeful overdose attempt.  Reassessment: Patient continues to be hypoglycemic upon arrival despite having 2 sandwiches, D50, D10 in route.  Therefore, patient will require a continuous D10 drip and admission for further management  Disposition: Admit to medicine   FINAL CLINICAL IMPRESSION(S) / ED DIAGNOSES   Final diagnoses:  Hypoglycemia   Rx / DC Orders   ED Discharge Orders     None      Note:  This document was prepared using Dragon voice recognition software and may include unintentional dictation errors.   Naaman Plummer, MD 05/08/22 2003

## 2022-05-08 NOTE — Assessment & Plan Note (Signed)
D/d include TIA/ CVA/ age related and back issue related to SIRS. MRI brain. Echo.  PT consult.  Thyroid studies.

## 2022-05-08 NOTE — Assessment & Plan Note (Signed)
Blood pressure 107/68, pulse 73, temperature 97.8 F (36.6 C), temperature source Oral, resp. rate 20, height 5\' 7"  (1.702 m), weight 77.1 kg, SpO2 100 %. Prn hydralazine.  BP low in ED. MIVF. Orthostatic vitals.

## 2022-05-08 NOTE — ED Triage Notes (Signed)
Patient to ED via [facility] from home for hypoglycemia. Patient has been feeling weak and not eating as much for the past few days. Patient took her insulin approx 2 hours ago. Fire initially got a cbg of 57 and after eating and 36mL of D10 patient's cbg was 94 with EMS. Patient Aox4 at this time.

## 2022-05-08 NOTE — Assessment & Plan Note (Signed)
Cont levothyroxine at 125 mcg. Free t4 and tsh.

## 2022-05-08 NOTE — Assessment & Plan Note (Signed)
Continue meds Cymbalta but dose decreased to 20 mg due to low BP.

## 2022-05-08 NOTE — Assessment & Plan Note (Signed)
PT consult suspect pt may be dehydrated but also have had back issues. Fall precautions.

## 2022-05-09 DIAGNOSIS — R531 Weakness: Secondary | ICD-10-CM | POA: Diagnosis not present

## 2022-05-09 DIAGNOSIS — I440 Atrioventricular block, first degree: Secondary | ICD-10-CM

## 2022-05-09 LAB — COMPREHENSIVE METABOLIC PANEL
ALT: 16 U/L (ref 0–44)
AST: 22 U/L (ref 15–41)
Albumin: 3.7 g/dL (ref 3.5–5.0)
Alkaline Phosphatase: 68 U/L (ref 38–126)
Anion gap: 10 (ref 5–15)
BUN: 24 mg/dL — ABNORMAL HIGH (ref 8–23)
CO2: 25 mmol/L (ref 22–32)
Calcium: 9.2 mg/dL (ref 8.9–10.3)
Chloride: 98 mmol/L (ref 98–111)
Creatinine, Ser: 1.2 mg/dL — ABNORMAL HIGH (ref 0.44–1.00)
GFR, Estimated: 47 mL/min — ABNORMAL LOW (ref >60)
Glucose, Bld: 239 mg/dL — ABNORMAL HIGH (ref 70–99)
Potassium: 3.8 mmol/L (ref 3.5–5.1)
Sodium: 133 mmol/L — ABNORMAL LOW (ref 135–145)
Total Bilirubin: 0.7 mg/dL (ref 0.3–1.2)
Total Protein: 6.8 g/dL (ref 6.5–8.1)

## 2022-05-09 LAB — CBC
HCT: 35.5 % — ABNORMAL LOW (ref 36.0–46.0)
Hemoglobin: 11.4 g/dL — ABNORMAL LOW (ref 12.0–15.0)
MCH: 27.3 pg (ref 26.0–34.0)
MCHC: 32.1 g/dL (ref 30.0–36.0)
MCV: 85.1 fL (ref 80.0–100.0)
Platelets: 383 10*3/uL (ref 150–400)
RBC: 4.17 MIL/uL (ref 3.87–5.11)
RDW: 14.8 % (ref 11.5–15.5)
WBC: 11.2 10*3/uL — ABNORMAL HIGH (ref 4.0–10.5)
nRBC: 0 % (ref 0.0–0.2)

## 2022-05-09 LAB — GLUCOSE, CAPILLARY
Glucose-Capillary: 214 mg/dL — ABNORMAL HIGH (ref 70–99)
Glucose-Capillary: 237 mg/dL — ABNORMAL HIGH (ref 70–99)
Glucose-Capillary: 168 mg/dL — ABNORMAL HIGH (ref 70–99)
Glucose-Capillary: 182 mg/dL — ABNORMAL HIGH (ref 70–99)
Glucose-Capillary: 149 mg/dL — ABNORMAL HIGH (ref 70–99)

## 2022-05-09 LAB — HEMOGLOBIN A1C
Hgb A1c MFr Bld: 6.4 % — ABNORMAL HIGH (ref 4.8–5.6)
Mean Plasma Glucose: 136.98 mg/dL

## 2022-05-09 MED ORDER — DULOXETINE HCL 30 MG PO CPEP
60.0000 mg | ORAL_CAPSULE | Freq: Every day | ORAL | Status: DC
  Administered 2022-05-10: 60 mg via ORAL
  Filled 2022-05-09: qty 2

## 2022-05-09 NOTE — Progress Notes (Signed)
PROGRESS NOTE    Tamara [name]  XTG:[id number] DOB: [birth year] DOA: 05/08/2022 PCP: [facility]   Brief Narrative:  Tamara [name] is a 76 y.o. female with medical history significant of  Dm ii, Depression, SDH, who presents with generalized weakness and recurrent hypoglycemia despite appropriate treatment at home.    Assessment & Plan:   Principal Problem:   Generalized weakness Active Problems:   Hypoglycemia   Falls frequently   Diabetes mellitus (HCC)   Essential hypertension   AKI (acute kidney injury) (HCC)   Anemia   Acquired hypothyroidism   Sleep apnea   Depression   Gastroesophageal reflux disease   Mixed hyperlipidemia   Acute recurrent hypoglycemia with acute metabolic encephalopathy  Secondary to insulin-dependent diabetes mellitus type 2, well controlled -Patient with episode of hypoglycemia at home with appropriate p.o. intake with recurrent hypoglycemia requiring EMS response who again was treated with recurrent hypoglycemia. -Patient is on long-acting insulin at home, concerned that patient had not taken her medications twice but she and her caretaker confirms this was less likely given her usual compliance -There is some concern for encephalopathy which complicates exact timeframe of events -Patient has been attempting to change her current diet at home while on insulin which may have resulted in profound hypoglycemia -Hold long-acting insulin, continue sliding scale of the next 24 hours and adjust appropriately. -Lengthy discussion at bedside with caretaker about possible improvement in symptoms and management with outpatient endocrinology referral for continuous glucose monitoring as well as possible insulin pump.  Generalized weakness Ambulatory dysfunction and recurrent falls -Imaging negative, PT OT to follow -Patient has chronic ambulatory dysfunction secondary to previous and extensive back surgeries -as many as 11 falls since January of  this year per her caretaker -Appreciate PT OT recommendations  Essential hypertension -Follow repeat blood pressures, titrate home medications as appropriate  AKI versus questionable CKD (unclear diagnosis) -Continue to follow clinically, urine output appropriate -No recent labs, creatinine 4 years ago appears to be elevated -Creatinine continues to trend upwards   Chronic anemia of chronic disease secondary to above  -Continue to follow repeat labs   Acquired hypothyroidism Cont levothyroxine at 125 mcg. Free T4 minimally elevated TSH minimally decreased.    Sleep apnea Continue with in-house machine, or patient can bring home CPAP   Depression Continue home Cymbalta   Gastroesophageal reflux disease Continue PPI   Mixed hyperlipidemia Am lipid panel.   DVT prophylaxis: Early ambulation Code Status: Full Family Communication: Caretaker at bedside  Status is: Inpt  Dispo: The patient is from: Home              Anticipated d/c is to: Home              Anticipated d/c date is: 24-48h              Patient currently NOT medically stable for discharge  Consultants:  None  Procedures:  None  Antimicrobials:  None   Subjective: No acute issues/events overnight, back to baseline per caretaker  Objective: Vitals:   05/08/22 2306 05/09/22 0418 05/09/22 0453 05/09/22 0720  BP: 126/61 130/79  130/81  Pulse: 80 91  90  Resp: 18 19    Temp: 99.2 F (37.3 C) 97.8 F (36.6 C)  98.3 F (36.8 C)  TempSrc: Oral Oral  Oral  SpO2: 100% 100%  99%  Weight: 70.2 kg  70 kg   Height: 5\' 7"  (1.702 m)       Intake/Output Summary (  Last 24 hours) at 05/09/2022 0742 Last data filed at 05/09/2022 0554 Gross per 24 hour  Intake 752.42 ml  Output --  Net 752.42 ml   Filed Weights   05/08/22 1751 05/08/22 2306 05/09/22 0453  Weight: 77.1 kg 70.2 kg 70 kg    Examination:  General exam: Appears calm and comfortable  Respiratory system: Clear to auscultation. Respiratory  effort normal. Cardiovascular system: S1 & S2 heard, RRR. No JVD, murmurs, rubs, gallops or clicks. No pedal edema. Gastrointestinal system: Abdomen is nondistended, soft and nontender. No organomegaly or masses felt. Normal bowel sounds heard. Central nervous system: Alert and oriented. No focal neurological deficits. Extremities: Symmetric 5 x 5 power. Skin: No rashes, lesions or ulcers Psychiatry: Judgement and insight appear normal. Mood & affect appropriate.    Data Reviewed: I have personally reviewed following labs and imaging studies  CBC: Recent Labs  Lab 05/08/22 1801 05/09/22 0439  WBC 11.5* 11.2*  NEUTROABS 8.8*  --   HGB 11.5* 11.4*  HCT 36.1 35.5*  MCV 85.7 85.1  PLT 405* 383   Basic Metabolic Panel: Recent Labs  Lab 05/08/22 1801 05/09/22 0439  NA 135 133*  K 3.5 3.8  CL 101 98  CO2 28 25  GLUCOSE 127* 239*  BUN 24* 24*  CREATININE 1.15* 1.20*  CALCIUM 9.3 9.2   GFR: Estimated Creatinine Clearance: 39.4 mL/min (A) (by C-G formula based on SCr of 1.2 mg/dL (H)). Liver Function Tests: Recent Labs  Lab 05/08/22 1801 05/09/22 0439  AST 20 22  ALT 15 16  ALKPHOS 72 68  BILITOT 0.7 0.7  PROT 7.2 6.8  ALBUMIN 3.7 3.7   No results for input(s): "LIPASE", "AMYLASE" in the last 168 hours. No results for input(s): "AMMONIA" in the last 168 hours. Coagulation Profile: No results for input(s): "INR", "PROTIME" in the last 168 hours. Cardiac Enzymes: No results for input(s): "CKTOTAL", "CKMB", "CKMBINDEX", "TROPONINI" in the last 168 hours. BNP (last 3 results) No results for input(s): "PROBNP" in the last 8760 hours. HbA1C: Recent Labs    05/08/22 2153  HGBA1C 6.4*   CBG: Recent Labs  Lab 05/08/22 1831 05/08/22 1935 05/08/22 2150 05/08/22 2304 05/09/22 0553  GLUCAP 126* 130* 98 91 214*   Lipid Profile: Recent Labs    05/08/22 1801  CHOL 133  HDL 74  LDLCALC 39  TRIG 99  CHOLHDL 1.8   Thyroid Function Tests: Recent Labs     05/08/22 2153  TSH 0.166*  FREET4 1.60*   Anemia Panel: No results for input(s): "VITAMINB12", "FOLATE", "FERRITIN", "TIBC", "IRON", "RETICCTPCT" in the last 72 hours. Sepsis Labs: No results for input(s): "PROCALCITON", "LATICACIDVEN" in the last 168 hours.  No results found for this or any previous visit (from the past 240 hour(s)).   Radiology Studies: CT Head Wo Contrast  Result Date: 05/08/2022 CLINICAL DATA:  Mental status change, unknown cause EXAM: CT HEAD WITHOUT CONTRAST TECHNIQUE: Contiguous axial images were obtained from the base of the skull through the vertex without intravenous contrast. RADIATION DOSE REDUCTION: This exam was performed according to the departmental dose-optimization program which includes automated exposure control, adjustment of the mA and/or kV according to patient size and/or use of iterative reconstruction technique. COMPARISON:  01/13/2019 FINDINGS: Brain: No acute intracranial abnormality. Specifically, no hemorrhage, hydrocephalus, mass lesion, acute infarction, or significant intracranial injury. Mild age related volume loss. Vascular: No hyperdense vessel or unexpected calcification. Skull: No acute calvarial abnormality. Sinuses/Orbits: No acute findings Other: None IMPRESSION: No acute intracranial  abnormality. Electronically Signed   By: Charlett Nose M.D.   On: 05/08/2022 20:35    Scheduled Meds:  atorvastatin  10 mg Oral Daily   DULoxetine  20 mg Oral Daily   insulin aspart  0-15 Units Subcutaneous TID WC   levothyroxine  125 mcg Oral QAC breakfast   pantoprazole (PROTONIX) IV  40 mg Intravenous Q12H   sodium chloride flush  3 mL Intravenous Q12H   Continuous Infusions:  sodium chloride Stopped (05/09/22 0543)   dextrose 60 mL/hr at 05/09/22 0157    LOS: 0 days   Time spent:  Azucena Fallen, DO [facility]  If 7PM-7AM, please contact night-coverage [email or web address]  05/09/2022, 7:42 AM

## 2022-05-09 NOTE — Evaluation (Signed)
Physical Therapy Evaluation Patient Details Name: Tamara [name] MRN: [id number] DOB: 03/13/1946 Today's Date: 05/09/2022  History of Present Illness  Pt is a 76 yo female that presented to the ED for hypoglyecemia., falls at home. MRI did show "New thin subdural fluid collection over the left cerebral  convexity without mass effect".  PMH of DMII, SDH, depression.   Clinical Impression  Patient alert, agreeable to PT reported no pain and oriented x4. The patient stated at baseline she is independent in home with ADLs, has caregivers 24/7 to assist with IADLs, and uses a rollator for community ambulation. Pt has had recent falls, and is currently in outpatient PT to address strengthening and balance deficits.  The patient was able to perform bed mobility modI, and sit <> Stand twice with RW and supervision. Cues needed for hand placement to improve safety and independence. She was able to stand for several minutes for orthostatic assessment. She did report light headedness, BP soft but negative for orthostatics. She was able to sit and rest, and then ambulated 76ft with RW and CGA. Pt declined further ambulation at this time, a little fearful of previous light headedness. Overall the patient would benefit from continuing her outpatient PT to maximize safety and function, recommend discharge home with frequent/constant supervision/assistance.        Recommendations for follow up therapy are one component of a multi-disciplinary discharge planning process, led by the attending physician.  Recommendations may be updated based on patient status, additional functional criteria and insurance authorization.  Follow Up Recommendations Outpatient PT    Assistance Recommended at Discharge Frequent or constant Supervision/Assistance  Patient can return home with the following  Assistance with cooking/housework;Direct supervision/assist for financial management;Assist for transportation;Help with stairs or  ramp for entrance    Equipment Recommendations None recommended by PT  Recommendations for Other Services       Functional Status Assessment Patient has had a recent decline in their functional status and demonstrates the ability to make significant improvements in function in a reasonable and predictable amount of time.     Precautions / Restrictions Precautions Precautions: Fall Restrictions Weight Bearing Restrictions: No      Mobility  Bed Mobility Overal bed mobility: Modified Independent                  Transfers Overall transfer level: Needs assistance Equipment used: Rolling walker (2 wheels) Transfers: Sit to/from Stand Sit to Stand: Supervision           General transfer comment: cued for hand placement due to pt being used to rollator    Ambulation/Gait Ambulation/Gait assistance: Min guard Gait Distance (Feet): 30 Feet Assistive device: Rolling walker (2 wheels)         General Gait Details: pt declined further mobility at this time, hesitant due to the light headedness she felt earlier in standing  Stairs            Wheelchair Mobility    Modified Rankin (Stroke Patients Only)       Balance Overall balance assessment: Needs assistance Sitting-balance support: Feet supported Sitting balance-Leahy Scale: Good       Standing balance-Leahy Scale: Fair                               Pertinent Vitals/Pain Pain Assessment Pain Assessment: No/denies pain    Home Living Family/patient expects to be discharged to:: Private residence Living Arrangements: Alone Available  Help at Discharge: Available 24 hours/day;Personal care attendant Type of Home: House Home Access: Stairs to enter Entrance Stairs-Rails: Left Entrance Stairs-Number of Steps: 4   Home Layout: One level Home Equipment: Grab bars - tub/shower;Rollator (4 wheels);Shower seat (tripod walker)      Prior Function Prior Level of Function : Needs  assist             Mobility Comments: per pt she is ambulatory with rollator for community, does not use in the house ADLs Comments: pt stated she is normally modI/I for these. Caregivers are primarily there for supervision, some for cooking/cleaning as needed. pt stated she does not cook much     Hand Dominance        Extremity/Trunk Assessment   Upper Extremity Assessment Upper Extremity Assessment: Overall WFL for tasks assessed (R wrist splint present, pt reported she's been told she "fractured" a bone, but didn't break it)    Lower Extremity Assessment Lower Extremity Assessment: Generalized weakness    Cervical / Trunk Assessment Cervical / Trunk Assessment: Normal  Communication   Communication: No difficulties  Cognition Arousal/Alertness: Awake/alert Behavior During Therapy: WFL for tasks assessed/performed Overall Cognitive Status: Within Functional Limits for tasks assessed                                          General Comments      Exercises     Assessment/Plan    PT Assessment Patient needs continued PT services  PT Problem List Decreased strength;Decreased mobility;Decreased activity tolerance;Decreased balance       PT Treatment Interventions DME instruction;Therapeutic exercise;Gait training;Balance training;Stair training;Neuromuscular re-education;Therapeutic activities;Patient/family education;Functional mobility training    PT Goals (Current goals can be found in the Care Plan section)  Acute Rehab PT Goals Patient Stated Goal: to go home PT Goal Formulation: With patient Time For Goal Achievement: 05/23/22 Potential to Achieve Goals: Good    Frequency Min 2X/week     Co-evaluation               AM-PAC PT "6 Clicks" Mobility  Outcome Measure Help needed turning from your back to your side while in a flat bed without using bedrails?: None Help needed moving from lying on your back to sitting on the side of  a flat bed without using bedrails?: None Help needed moving to and from a bed to a chair (including a wheelchair)?: None Help needed standing up from a chair using your arms (e.g., wheelchair or bedside chair)?: None Help needed to walk in hospital room?: None Help needed climbing 3-5 steps with a railing? : A Little 6 Click Score: 23    End of Session Equipment Utilized During Treatment: Gait belt Activity Tolerance: Patient tolerated treatment well Patient left: in bed;with call bell/phone within reach;with bed alarm set Nurse Communication: Mobility status PT Visit Diagnosis: Other abnormalities of gait and mobility (R26.89);Difficulty in walking, not elsewhere classified (R26.2);Muscle weakness (generalized) (M62.81)    Time: [id number] PT Time Calculation (min) (ACUTE ONLY): 23 min   Charges:   PT Evaluation $PT Eval Low Complexity: 1 Low PT Treatments $Therapeutic Activity: 23-37 mins        Lieutenant Diego PT, DPT 11:47 AM,05/09/22

## 2022-05-09 NOTE — Progress Notes (Signed)
   05/09/22 1015  Clinical Encounter Type  Visited With Patient  Visit Type Initial;Social support  Referral From Nurse  Consult/Referral To Chaplain  Spiritual Encounters  Spiritual Needs Other (Comment) (AD)   Chaplain Shakeela Rabadan responded to a request for information on advanced directives. Chaplain B brought the document and provided a brief overview. Chaplain B urged pt to have further conversation with son or other designated Management consultant. Provided some feedback about how to facilitate adding to e-chart if notarized outside of hospital.  No further needs assessed at this time. Informed pt to simply ask nurse to page me if she wishes to discuss further.

## 2022-05-10 DIAGNOSIS — R531 Weakness: Secondary | ICD-10-CM | POA: Diagnosis not present

## 2022-05-10 LAB — BASIC METABOLIC PANEL
Anion gap: 6 (ref 5–15)
BUN: 19 mg/dL (ref 8–23)
CO2: 28 mmol/L (ref 22–32)
Calcium: 9.5 mg/dL (ref 8.9–10.3)
Chloride: 102 mmol/L (ref 98–111)
Creatinine, Ser: 1.09 mg/dL — ABNORMAL HIGH (ref 0.44–1.00)
GFR, Estimated: 53 mL/min — ABNORMAL LOW (ref >60)
Glucose, Bld: 143 mg/dL — ABNORMAL HIGH (ref 70–99)
Potassium: 4.3 mmol/L (ref 3.5–5.1)
Sodium: 136 mmol/L (ref 135–145)

## 2022-05-10 LAB — GLUCOSE, CAPILLARY: Glucose-Capillary: 149 mg/dL — ABNORMAL HIGH (ref 70–99)

## 2022-05-10 MED ORDER — BLOOD GLUCOSE MONITOR KIT: PACK | 0 refills | Status: DC

## 2022-05-10 MED ORDER — PROPRANOLOL HCL 40 MG PO TABS: 20.0000 mg | ORAL_TABLET | Freq: Two times a day (BID) | ORAL | 0 refills | Status: DC

## 2022-05-10 MED ORDER — TIZANIDINE HCL 2 MG PO TABS: 1.0000 mg | ORAL_TABLET | Freq: Every evening | ORAL | 0 refills | Status: DC | PRN

## 2022-05-10 MED ORDER — PEN NEEDLES 32G X 4 MM MISC: 1.0000 | 0 refills | Status: AC

## 2022-05-10 MED ORDER — INSULIN GLARGINE 100 UNIT/ML SOLOSTAR PEN
10.0000 [IU] | PEN_INJECTOR | Freq: Every day | SUBCUTANEOUS | 0 refills | Status: AC
Start: 2022-05-10 — End: ?

## 2022-05-10 MED ORDER — PANTOPRAZOLE SODIUM 40 MG PO TBEC
40.0000 mg | DELAYED_RELEASE_TABLET | Freq: Every day | ORAL | Status: DC
  Administered 2022-05-10: 40 mg via ORAL
  Filled 2022-05-10: qty 1

## 2022-05-10 NOTE — Discharge Summary (Signed)
Physician Discharge Summary  Tamara [name] ZOX:[id number] DOB: [birth year] DOA: 05/08/2022  PCP: [facility]  Admit date: 05/08/2022 Discharge date: 05/10/2022  Admitted From: Home Disposition:  Home  Recommendations for Outpatient Follow-up:  Follow up with PCP in 1-2 weeks Follow up with endocrinology as discussed  Home Health:None - outpatient PT recommended  Equipment/Devices:None  Discharge Condition:Stable  CODE STATUS:Full  Diet recommendation: Low carb as directed    Brief/Interim Summary: Patient is a pleasant 76 year old female with prior history of insulin-dependent diabetes type 2, depression, subdural hematoma who presents with generalized weakness hypoglycemia despite multiple attempts to treat her hypoglycemia at home.  Given discussion with patient's family/caretaker it appears her diet has changed somewhat drastically over the past few weeks and she is attempting to eat a more healthy well-balanced low-carb diet.  In light of this new information patient was monitored overnight here required only 13 units of sliding scale insulin over the past 24 hours.  We will transition patient to long-acting Lantus at discharge 10 units daily given her proclivity to eat snacks and other high carb items at home we will need to monitor for hyperglycemia but after discussion with patient and caretaker we will err on the side of caution and will prefer mild hyperglycemia over the next week to 2 weeks until she can be followed with endocrinology as previously discussed.  Of note the patient has multiple home medications including valsartan and propranolol which were discontinued at intake due to questionable hypotension orthostatic symptoms - propranolol restarted at half dose due to borderline tachycardia.  Discharge Diagnoses:  Principal Problem:   Generalized weakness Active Problems:   Hypoglycemia   Falls frequently   Diabetes mellitus (HCC)   Essential hypertension    AKI (acute kidney injury) ([place])   Anemia   Acquired hypothyroidism   Sleep apnea   Depression   Gastroesophageal reflux disease   Mixed hyperlipidemia   Acute recurrent hypoglycemia with acute metabolic encephalopathy  Secondary to insulin-dependent diabetes mellitus type 2, well controlled -Patient with episode of hypoglycemia at home with appropriate p.o. intake with recurrent hypoglycemia requiring EMS response who again was treated with recurrent hypoglycemia. -Patient is on long-acting insulin at home, concerned that patient had not taken her medications twice but she and her caretaker confirms this was less likely given her usual compliance -Decrease insulin regimen as above   Generalized weakness Ambulatory dysfunction and recurrent falls -Patient has chronic ambulatory dysfunction secondary to previous and extensive back surgeries -as many as 11 falls since January of this year per her caretaker -PT/OT recommending outpatient PT   Essential hypertension -Resume propranolol - DC vasartan   AKI versus questionable CKD (unclear diagnosis) -Resolved - likely baseline CD2 but will need further labs to confirm   Chronic anemia of chronic disease secondary to above  -Continue to follow - no signs/symptoms of blood loss   Acquired hypothyroidism Cont levothyroxine at 125 mcg. Free T4 minimally elevated TSH minimally decreased.    Sleep apnea Continue treatment per PCP   Depression Continue home Cymbalta   Gastroesophageal reflux disease Continue PPI    Discharge Instructions  Discharge Instructions     Discharge patient   Complete by: As directed    Discharge disposition: 01-Home or Self Care   Discharge patient date: 05/10/2022      Allergies as of 05/10/2022       Reactions   Ace Inhibitors Cough        Medication List  STOP taking these medications    Baclofen 5 MG Tabs   insulin aspart protamine- aspart (70-30) 100 UNIT/ML injection Commonly  known as: NOVOLOG MIX 70/30   meloxicam 15 MG tablet Commonly known as: MOBIC   Trulicity 1.5 JQ/4.9EE Sopn Generic drug: Dulaglutide   valsartan 80 MG tablet Commonly known as: DIOVAN       TAKE these medications    acetaminophen 325 MG tablet Commonly known as: TYLENOL Take 650 mg by mouth every 6 (six) hours as needed.   aspirin 81 MG chewable tablet Chew by mouth daily.   atorvastatin 20 MG tablet Commonly known as: LIPITOR Take 20 mg by mouth at bedtime.   blood glucose meter kit and supplies Kit Dispense based on patient and insurance preference. Use up to four times daily as directed.   buPROPion HCl ER (XL) 450 MG Tb24 Take 450 mg by mouth daily.   busPIRone 10 MG tablet Commonly known as: BUSPAR Take 10 mg by mouth 2 (two) times daily.   diclofenac Sodium 1 % Gel Commonly known as: VOLTAREN Apply 2 g topically 4 (four) times daily.   DULoxetine 60 MG capsule Commonly known as: CYMBALTA Take 60 mg by mouth 2 (two) times daily.   esomeprazole 20 MG capsule Commonly known as: NEXIUM Take 20 mg by mouth daily. To prevent coughing after eating.   fluticasone 50 MCG/ACT nasal spray Commonly known as: FLONASE Place 1 spray into both nostrils 2 (two) times daily.   insulin glargine 100 UNIT/ML Solostar Pen Commonly known as: LANTUS Inject 10 Units into the skin daily.   lamoTRIgine 150 MG tablet Commonly known as: LAMICTAL Take 150 mg by mouth 2 (two) times daily.   levothyroxine 112 MCG tablet Commonly known as: SYNTHROID Take 112 mcg by mouth daily. What changed: Another medication with the same name was removed. Continue taking this medication, and follow the directions you see here.   metFORMIN 500 MG tablet Commonly known as: GLUCOPHAGE Take 500 mg by mouth 2 (two) times daily.   Pen Needles 32G X 4 MM Misc 1 each by Does not apply route as directed.   propranolol 40 MG tablet Commonly known as: INDERAL Take 0.5 tablets (20 mg total) by  mouth 2 (two) times daily. What changed: how much to take   tiZANidine 2 MG tablet Commonly known as: ZANAFLEX Take 0.5 tablets (1 mg total) by mouth at bedtime as needed. What changed: how much to take        Allergies  Allergen Reactions   Ace Inhibitors Cough    Consultations: None   Procedures/Studies: CT Head Wo Contrast  Result Date: 05/08/2022 CLINICAL DATA:  Mental status change, unknown cause EXAM: CT HEAD WITHOUT CONTRAST TECHNIQUE: Contiguous axial images were obtained from the base of the skull through the vertex without intravenous contrast. RADIATION DOSE REDUCTION: This exam was performed according to the departmental dose-optimization program which includes automated exposure control, adjustment of the mA and/or kV according to patient size and/or use of iterative reconstruction technique. COMPARISON:  01/13/2019 FINDINGS: Brain: No acute intracranial abnormality. Specifically, no hemorrhage, hydrocephalus, mass lesion, acute infarction, or significant intracranial injury. Mild age related volume loss. Vascular: No hyperdense vessel or unexpected calcification. Skull: No acute calvarial abnormality. Sinuses/Orbits: No acute findings Other: None IMPRESSION: No acute intracranial abnormality. Electronically Signed   By: Rolm Baptise M.D.   On: 05/08/2022 20:35     Subjective: No acute issues/events overnight   Discharge Exam: Vitals:   05/10/22  0410 05/10/22 0812  BP: 119/90 115/79  Pulse: (!) 101 (!) 101  Resp: 16 18  Temp: 98.2 F (36.8 C) 98.1 F (36.7 C)  SpO2: 98% 99%   Vitals:   05/09/22 1626 05/09/22 2152 05/10/22 0410 05/10/22 0812  BP: 121/73 (!) 148/67 119/90 115/79  Pulse: 92 97 (!) 101 (!) 101  Resp: _0 Temp: 98.2 F (36.8 C) 97.9 F (36.6 C) 98.2 F (36.8 C) 98.1 F (36.7 C)  TempSrc:   Oral Oral  SpO2: 100% 98% 98% 99%  Weight:   70.4 kg   Height:        General: Pt is alert, awake, not in acute distress Cardiovascular:  RRR, S1/S2 +, no rubs, no gallops Respiratory: CTA bilaterally, no wheezing, no rhonchi Abdominal: Soft, NT, ND, bowel sounds + Extremities: no edema, no cyanosis    The results of significant diagnostics from this hospitalization (including imaging, microbiology, ancillary and laboratory) are listed below for reference.     Microbiology: No results found for this or any previous visit (from the past 240 hour(s)).   Labs: BNP (last 3 results) No results for input(s): "BNP" in the last 8760 hours. Basic Metabolic Panel: Recent Labs  Lab 05/08/22 1801 05/09/22 0439 05/10/22 0646  NA 135 133* 136  K 3.5 3.8 4.3  CL 101 98 102  CO2 _1 GLUCOSE 127* 239* 143*  BUN 24* 24* 19  CREATININE 1.15* 1.20* 1.09*  CALCIUM 9.3 9.2 9.5   Liver Function Tests: Recent Labs  Lab 05/08/22 1801 05/09/22 0439  AST 20 22  ALT 15 16  ALKPHOS 72 68  BILITOT 0.7 0.7  PROT 7.2 6.8  ALBUMIN 3.7 3.7   No results for input(s): "LIPASE", "AMYLASE" in the last 168 hours. No results for input(s): "AMMONIA" in the last 168 hours. CBC: Recent Labs  Lab 05/08/22 1801 05/09/22 0439  WBC 11.5* 11.2*  NEUTROABS 8.8*  --   HGB 11.5* 11.4*  HCT 36.1 35.5*  MCV 85.7 85.1  PLT 405* 383   Cardiac Enzymes: No results for input(s): "CKTOTAL", "CKMB", "CKMBINDEX", "TROPONINI" in the last 168 hours. BNP: Invalid input(s): "POCBNP" CBG: Recent Labs  Lab 05/09/22 0815 05/09/22 1135 05/09/22 1626 05/09/22 2156 05/10/22 0814  GLUCAP 237* 168* 182* 149* 149*   D-Dimer No results for input(s): "DDIMER" in the last 72 hours. Hgb A1c Recent Labs    05/08/22 2153  HGBA1C 6.4*   Lipid Profile Recent Labs    05/08/22 1801  CHOL 133  HDL 74  LDLCALC 39  TRIG 99  CHOLHDL 1.8   Thyroid function studies Recent Labs    05/08/22 2153  TSH 0.166*   Anemia work up No results for input(s): "VITAMINB12", "FOLATE", "FERRITIN", "TIBC", "IRON", "RETICCTPCT" in the last 72  hours. Urinalysis    Component Value Date/Time   COLORURINE YELLOW (A) 05/08/2022 1754   APPEARANCEUR HAZY (A) 05/08/2022 1754   LABSPEC 1.018 05/08/2022 1754   PHURINE 5.0 05/08/2022 1754   GLUCOSEU NEGATIVE 05/08/2022 1754   HGBUR NEGATIVE 05/08/2022 1754   BILIRUBINUR NEGATIVE 05/08/2022 1754   KETONESUR NEGATIVE 05/08/2022 1754   PROTEINUR 30 (A) 05/08/2022 1754   NITRITE NEGATIVE 05/08/2022 1754   LEUKOCYTESUR NEGATIVE 05/08/2022 1754   Sepsis Labs Recent Labs  Lab 05/08/22 1801 05/09/22 0439  WBC 11.5* 11.2*   Microbiology No results found for this or any previous visit (from the past 240 hour(s)).   Time coordinating discharge: Over 30  minutes  SIGNED:   Little Ishikawa, DO [facility] 05/10/2022, 11:48 AM Pager   If 7PM-7AM, please contact night-coverage [email or web address]

## 2022-05-11 ENCOUNTER — Other Ambulatory Visit (INDEPENDENT_AMBULATORY_CARE_PROVIDER_SITE_OTHER): Payer: Self-pay | Admitting: Vascular Surgery

## 2022-05-11 DIAGNOSIS — I739 Peripheral vascular disease, unspecified: Secondary | ICD-10-CM

## 2022-05-12 ENCOUNTER — Encounter (INDEPENDENT_AMBULATORY_CARE_PROVIDER_SITE_OTHER): Payer: Self-pay | Admitting: Nurse Practitioner

## 2022-05-12 ENCOUNTER — Ambulatory Visit (INDEPENDENT_AMBULATORY_CARE_PROVIDER_SITE_OTHER): Payer: Medicare Other

## 2022-05-12 ENCOUNTER — Ambulatory Visit (INDEPENDENT_AMBULATORY_CARE_PROVIDER_SITE_OTHER): Payer: Medicare Other | Admitting: Nurse Practitioner

## 2022-05-12 VITALS — BP 102/65 | HR 80 | Resp 16 | Ht 67.0 in | Wt 156.0 lb

## 2022-05-12 DIAGNOSIS — I739 Peripheral vascular disease, unspecified: Secondary | ICD-10-CM

## 2022-05-12 DIAGNOSIS — I10 Essential (primary) hypertension: Secondary | ICD-10-CM | POA: Diagnosis not present

## 2022-05-12 DIAGNOSIS — Z794 Long term (current) use of insulin: Secondary | ICD-10-CM

## 2022-05-12 DIAGNOSIS — E1165 Type 2 diabetes mellitus with hyperglycemia: Secondary | ICD-10-CM | POA: Diagnosis not present

## 2022-05-14 ENCOUNTER — Emergency Department
Admission: EM | Admit: 2022-05-14 | Discharge: 2022-05-15 | Disposition: A | Discharge status: Home / Self Care | Payer: Medicare Other | Attending: Emergency Medicine | Admitting: Emergency Medicine

## 2022-05-14 ENCOUNTER — Other Ambulatory Visit: Payer: Self-pay

## 2022-05-14 DIAGNOSIS — S0101XA Laceration without foreign body of scalp, initial encounter: Secondary | ICD-10-CM

## 2022-05-14 DIAGNOSIS — R7309 Other abnormal glucose: Secondary | ICD-10-CM | POA: Diagnosis not present

## 2022-05-14 DIAGNOSIS — S0990XA Unspecified injury of head, initial encounter: Secondary | ICD-10-CM | POA: Diagnosis present

## 2022-05-14 DIAGNOSIS — Y9389 Activity, other specified: Secondary | ICD-10-CM | POA: Diagnosis not present

## 2022-05-14 DIAGNOSIS — W19XXXA Unspecified fall, initial encounter: Secondary | ICD-10-CM

## 2022-05-14 DIAGNOSIS — W01198A Fall on same level from slipping, tripping and stumbling with subsequent striking against other object, initial encounter: Secondary | ICD-10-CM | POA: Diagnosis not present

## 2022-05-14 LAB — CBG MONITORING, ED: Glucose-Capillary: 164 mg/dL — ABNORMAL HIGH (ref 70–99)

## 2022-05-14 MED ORDER — LIDOCAINE HCL (PF) 1 % IJ SOLN
5.0000 mL | Freq: Once | INTRAMUSCULAR | Status: AC
  Administered 2022-05-15: 5 mL
  Filled 2022-05-14: qty 5

## 2022-05-14 NOTE — ED Triage Notes (Signed)
Pt arrived via [facility] from home where she went to sit down and upon sitting, hit posterior head on stool that was behind chair. Pt denies LOC, dizziness and blood thinners. Pt ambulates at baseline with walker. Aprox 1in laceration noted to the posterior head with bleeding controlled.

## 2022-05-14 NOTE — ED Notes (Signed)
Patient laceration was cleaned with normal saline.

## 2022-05-14 NOTE — ED Provider Notes (Signed)
[facility] Provider Note    Event Date/Time   First MD Initiated Contact with Patient 05/14/22 2352     (approximate)   History   Fall   HPI  Tamara [name] is a 76 y.o. female  who presents to the emergency department today with primary complaint of head injury.  The patient states that she had just given her dog tree and she was going to sit down when she caught her foot against the wheel of her walker.  She then fell backwards and hit the back of her head on a stool.  She denies any loss of consciousness or lightheadedness.  She is complaining of some discomfort around the laceration to the back of her head. She denies any other injuries. Denies any recent illness.    Physical Exam   Triage Vital Signs: ED Triage Vitals  Enc Vitals Group     BP 05/14/22 2322 122/78     Pulse Rate 05/14/22 2322 78     Resp 05/14/22 2322 17     Temp 05/14/22 2322 98.7 F (37.1 C)     Temp Source 05/14/22 2322 Oral     SpO2 05/14/22 2322 99 %     Weight 05/14/22 2320 170 lb (77.1 kg)     Height 05/14/22 2320 5\' 7"  (1.702 m)     Head Circumference --      Peak Flow --      Pain Score --      Pain Loc --      Pain Edu? --      Excl. in GC? --     Most recent vital signs: Vitals:   05/14/22 2322 05/14/22 2327  BP: 122/78   Pulse: 78   Resp: 17   Temp: 98.7 F (37.1 C)   SpO2: 99% 97%   General: Awake, alert, oriented. CV:  Good peripheral perfusion. Regular rate and rhythm. Resp:  Normal effort. Lungs clear. Abd:  No distention.  Other:  3 cm linear laceration to occiput.    ED Results / Procedures / Treatments   Labs (all labs ordered are listed, but only abnormal results are displayed) Labs Reviewed  CBG MONITORING, ED - Abnormal; Notable for the following components:      Result Value   Glucose-Capillary 164 (*)    All other components within normal limits     EKG  None   RADIOLOGY None  PROCEDURES:  Critical Care performed:  No  Procedures  LACERATION REPAIR Performed by: 05/16/22 Authorized by: Phineas Semen Consent: Verbal consent obtained. Risks and benefits: risks, benefits and alternatives were discussed Consent given by: patient Patient identity confirmed: provided demographic data Prepped and Draped in normal sterile fashion Wound explored  Laceration Location: occiput  Laceration Length: 3cm  No Foreign Bodies seen or palpated  Anesthesia: local infiltration  Local anesthetic: lidocaine 1% without epinephrine  Anesthetic total: 2 ml  Irrigation method: syringe Amount of cleaning: standard  Skin closure: staples  Number of staples: 6    Patient tolerance: Patient tolerated the procedure well with no immediate complications.   MEDICATIONS ORDERED IN ED: Medications - No data to display   IMPRESSION / MDM / ASSESSMENT AND PLAN / ED COURSE  I reviewed the triage vital signs and the nursing notes.                              Differential diagnosis includes,  but is not limited to, laceration, intracranial bleed.   Patient's presentation is most consistent with acute presentation with potential threat to life or bodily function.  Patient presents to the emergency department today after suffering a laceration to the back of her head.  She does have roughly 3 cm laceration to her occiput which was hemostatic.  This was closed with staples.  I did have a discussion with the patient about potentially obtaining CT scans of her head.  Mechanism is not concerning.  Patient at this time felt comfortable deferring CT scan of the head which I think is reasonable.  We did discuss return precautions.  FINAL CLINICAL IMPRESSION(S) / ED DIAGNOSES   Final diagnoses:  Fall, initial encounter  Laceration of scalp, initial encounter     Note:  This document was prepared using Dragon voice recognition software and may include unintentional dictation errors.    Phineas Semen,  MD 05/15/22 [phone]

## 2022-05-14 NOTE — ED Notes (Signed)
Patient has laceration on the back of head. Only complains of headache right now. No LOC. Headache pain at 6/10. Patient does not take any blood thinners

## 2022-05-15 DIAGNOSIS — S0101XA Laceration without foreign body of scalp, initial encounter: Secondary | ICD-10-CM | POA: Diagnosis not present

## 2022-05-15 NOTE — ED Notes (Signed)
Patient ride is here.

## 2022-05-15 NOTE — Discharge Instructions (Signed)
As we discussed your staples should be removed in roughly 10 days. Please seek attention for any worsening headache, nausea, vomiting, confusion or change in behavior. Please seek attention for any redness, warmth, bad smelling discharge or fevers or any other signs or symptoms concerning for infection.

## 2022-05-15 NOTE — ED Notes (Signed)
Called Katrina, patients caregiver at [phone]. She is on her way to pick up the patient.

## 2022-05-24 ENCOUNTER — Encounter (INDEPENDENT_AMBULATORY_CARE_PROVIDER_SITE_OTHER): Payer: Self-pay | Admitting: Nurse Practitioner

## 2022-07-13 ENCOUNTER — Ambulatory Visit: Payer: Self-pay

## 2022-07-16 ENCOUNTER — Emergency Department: Payer: Medicare Other

## 2022-07-16 ENCOUNTER — Inpatient Hospital Stay: Payer: Medicare Other

## 2022-07-16 ENCOUNTER — Ambulatory Visit (INDEPENDENT_AMBULATORY_CARE_PROVIDER_SITE_OTHER)
Admission: EM | Admit: 2022-07-16 | Discharge: 2022-07-16 | Disposition: A | Discharge status: Other Acute Inpatient Hospital | Payer: Medicare Other | Source: Home / Self Care | Attending: Family Medicine | Admitting: Family Medicine

## 2022-07-16 ENCOUNTER — Inpatient Hospital Stay
Admission: EM | Admit: 2022-07-16 | Discharge: 2022-07-18 | DRG: 092 | Disposition: A | Discharge status: Home Health | Payer: Medicare Other | Attending: Obstetrics and Gynecology | Admitting: Obstetrics and Gynecology

## 2022-07-16 DIAGNOSIS — Z833 Family history of diabetes mellitus: Secondary | ICD-10-CM | POA: Diagnosis not present

## 2022-07-16 DIAGNOSIS — K219 Gastro-esophageal reflux disease without esophagitis: Secondary | ICD-10-CM | POA: Diagnosis present

## 2022-07-16 DIAGNOSIS — E119 Type 2 diabetes mellitus without complications: Secondary | ICD-10-CM

## 2022-07-16 DIAGNOSIS — Z7984 Long term (current) use of oral hypoglycemic drugs: Secondary | ICD-10-CM

## 2022-07-16 DIAGNOSIS — F411 Generalized anxiety disorder: Secondary | ICD-10-CM | POA: Diagnosis present

## 2022-07-16 DIAGNOSIS — E1122 Type 2 diabetes mellitus with diabetic chronic kidney disease: Secondary | ICD-10-CM | POA: Diagnosis present

## 2022-07-16 DIAGNOSIS — D72829 Elevated white blood cell count, unspecified: Secondary | ICD-10-CM | POA: Diagnosis present

## 2022-07-16 DIAGNOSIS — M1990 Unspecified osteoarthritis, unspecified site: Secondary | ICD-10-CM

## 2022-07-16 DIAGNOSIS — I129 Hypertensive chronic kidney disease with stage 1 through stage 4 chronic kidney disease, or unspecified chronic kidney disease: Secondary | ICD-10-CM | POA: Diagnosis present

## 2022-07-16 DIAGNOSIS — E039 Hypothyroidism, unspecified: Secondary | ICD-10-CM | POA: Diagnosis present

## 2022-07-16 DIAGNOSIS — I10 Essential (primary) hypertension: Secondary | ICD-10-CM | POA: Diagnosis present

## 2022-07-16 DIAGNOSIS — E871 Hypo-osmolality and hyponatremia: Secondary | ICD-10-CM | POA: Diagnosis present

## 2022-07-16 DIAGNOSIS — Z79899 Other long term (current) drug therapy: Secondary | ICD-10-CM

## 2022-07-16 DIAGNOSIS — R296 Repeated falls: Secondary | ICD-10-CM

## 2022-07-16 DIAGNOSIS — N189 Chronic kidney disease, unspecified: Secondary | ICD-10-CM | POA: Diagnosis present

## 2022-07-16 DIAGNOSIS — R4182 Altered mental status, unspecified: Secondary | ICD-10-CM | POA: Insufficient documentation

## 2022-07-16 DIAGNOSIS — G4730 Sleep apnea, unspecified: Secondary | ICD-10-CM | POA: Diagnosis present

## 2022-07-16 DIAGNOSIS — R29810 Facial weakness: Secondary | ICD-10-CM | POA: Diagnosis present

## 2022-07-16 DIAGNOSIS — F419 Anxiety disorder, unspecified: Secondary | ICD-10-CM | POA: Diagnosis present

## 2022-07-16 DIAGNOSIS — M25552 Pain in left hip: Secondary | ICD-10-CM | POA: Diagnosis present

## 2022-07-16 DIAGNOSIS — M545 Low back pain, unspecified: Secondary | ICD-10-CM | POA: Diagnosis present

## 2022-07-16 DIAGNOSIS — I639 Cerebral infarction, unspecified: Principal | ICD-10-CM | POA: Diagnosis present

## 2022-07-16 DIAGNOSIS — R471 Dysarthria and anarthria: Secondary | ICD-10-CM

## 2022-07-16 DIAGNOSIS — R2990 Unspecified symptoms and signs involving the nervous system: Secondary | ICD-10-CM | POA: Diagnosis not present

## 2022-07-16 DIAGNOSIS — R413 Other amnesia: Secondary | ICD-10-CM | POA: Diagnosis present

## 2022-07-16 DIAGNOSIS — G459 Transient cerebral ischemic attack, unspecified: Secondary | ICD-10-CM

## 2022-07-16 DIAGNOSIS — W19XXXA Unspecified fall, initial encounter: Secondary | ICD-10-CM | POA: Diagnosis present

## 2022-07-16 DIAGNOSIS — F32A Depression, unspecified: Secondary | ICD-10-CM | POA: Diagnosis present

## 2022-07-16 DIAGNOSIS — R270 Ataxia, unspecified: Secondary | ICD-10-CM | POA: Diagnosis present

## 2022-07-16 DIAGNOSIS — Z794 Long term (current) use of insulin: Secondary | ICD-10-CM

## 2022-07-16 DIAGNOSIS — Z7902 Long term (current) use of antithrombotics/antiplatelets: Secondary | ICD-10-CM | POA: Diagnosis not present

## 2022-07-16 DIAGNOSIS — G8929 Other chronic pain: Secondary | ICD-10-CM | POA: Diagnosis present

## 2022-07-16 DIAGNOSIS — Z79890 Hormone replacement therapy (postmenopausal): Secondary | ICD-10-CM

## 2022-07-16 DIAGNOSIS — Z888 Allergy status to other drugs, medicaments and biological substances status: Secondary | ICD-10-CM

## 2022-07-16 DIAGNOSIS — Z7982 Long term (current) use of aspirin: Secondary | ICD-10-CM

## 2022-07-16 DIAGNOSIS — R112 Nausea with vomiting, unspecified: Secondary | ICD-10-CM | POA: Diagnosis not present

## 2022-07-16 DIAGNOSIS — I6389 Other cerebral infarction: Secondary | ICD-10-CM | POA: Diagnosis not present

## 2022-07-16 LAB — COMPREHENSIVE METABOLIC PANEL WITH GFR
ALT: 18 U/L (ref 0–44)
AST: 33 U/L (ref 15–41)
Albumin: 4.1 g/dL (ref 3.5–5.0)
Alkaline Phosphatase: 83 U/L (ref 38–126)
Anion gap: 12 (ref 5–15)
BUN: 22 mg/dL (ref 8–23)
CO2: 26 mmol/L (ref 22–32)
Calcium: 9.5 mg/dL (ref 8.9–10.3)
Chloride: 91 mmol/L — ABNORMAL LOW (ref 98–111)
Creatinine, Ser: 0.82 mg/dL (ref 0.44–1.00)
GFR, Estimated: >60 mL/min
Glucose, Bld: 148 mg/dL — ABNORMAL HIGH (ref 70–99)
Potassium: 4.6 mmol/L (ref 3.5–5.1)
Sodium: 129 mmol/L — ABNORMAL LOW (ref 135–145)
Total Bilirubin: 1 mg/dL (ref 0.3–1.2)
Total Protein: 7.5 g/dL (ref 6.5–8.1)

## 2022-07-16 LAB — CBC
HCT: 36.7 % (ref 36.0–46.0)
Hemoglobin: 11.8 g/dL — ABNORMAL LOW (ref 12.0–15.0)
MCH: 26.9 pg (ref 26.0–34.0)
MCHC: 32.2 g/dL (ref 30.0–36.0)
MCV: 83.6 fL (ref 80.0–100.0)
Platelets: 377 K/uL (ref 150–400)
RBC: 4.39 MIL/uL (ref 3.87–5.11)
RDW: 14.4 % (ref 11.5–15.5)
WBC: 12.2 K/uL — ABNORMAL HIGH (ref 4.0–10.5)
nRBC: 0 % (ref 0.0–0.2)

## 2022-07-16 LAB — CBC WITH DIFFERENTIAL/PLATELET
Abs Immature Granulocytes: 0.06 10*3/uL (ref 0.00–0.07)
Basophils Absolute: 0.1 10*3/uL (ref 0.0–0.1)
Basophils Relative: 1 %
Eosinophils Absolute: 0.2 10*3/uL (ref 0.0–0.5)
Eosinophils Relative: 2 %
HCT: 35.6 % — ABNORMAL LOW (ref 36.0–46.0)
Hemoglobin: 11.6 g/dL — ABNORMAL LOW (ref 12.0–15.0)
Immature Granulocytes: 1 %
Lymphocytes Relative: 16 %
Lymphs Abs: 1.7 10*3/uL (ref 0.7–4.0)
MCH: 27.5 pg (ref 26.0–34.0)
MCHC: 32.6 g/dL (ref 30.0–36.0)
MCV: 84.4 fL (ref 80.0–100.0)
Monocytes Absolute: 0.8 10*3/uL (ref 0.1–1.0)
Monocytes Relative: 8 %
Neutro Abs: 8 10*3/uL — ABNORMAL HIGH (ref 1.7–7.7)
Neutrophils Relative %: 72 %
Platelets: 394 10*3/uL (ref 150–400)
RBC: 4.22 MIL/uL (ref 3.87–5.11)
RDW: 14.6 % (ref 11.5–15.5)
WBC: 10.9 10*3/uL — ABNORMAL HIGH (ref 4.0–10.5)
nRBC: 0 % (ref 0.0–0.2)

## 2022-07-16 LAB — DIFFERENTIAL
Abs Immature Granulocytes: 0.08 K/uL — ABNORMAL HIGH (ref 0.00–0.07)
Basophils Absolute: 0 K/uL (ref 0.0–0.1)
Basophils Relative: 0 %
Eosinophils Absolute: 0.2 K/uL (ref 0.0–0.5)
Eosinophils Relative: 2 %
Immature Granulocytes: 1 %
Lymphocytes Relative: 12 %
Lymphs Abs: 1.4 K/uL (ref 0.7–4.0)
Monocytes Absolute: 0.9 K/uL (ref 0.1–1.0)
Monocytes Relative: 7 %
Neutro Abs: 9.6 K/uL — ABNORMAL HIGH (ref 1.7–7.7)
Neutrophils Relative %: 78 %

## 2022-07-16 LAB — APTT: aPTT: 49 seconds — ABNORMAL HIGH (ref 24–36)

## 2022-07-16 LAB — COMPREHENSIVE METABOLIC PANEL
ALT: 19 U/L (ref 0–44)
AST: 22 U/L (ref 15–41)
Albumin: 4 g/dL (ref 3.5–5.0)
Alkaline Phosphatase: 86 U/L (ref 38–126)
Anion gap: 10 (ref 5–15)
BUN: 22 mg/dL (ref 8–23)
CO2: 27 mmol/L (ref 22–32)
Calcium: 9.3 mg/dL (ref 8.9–10.3)
Chloride: 92 mmol/L — ABNORMAL LOW (ref 98–111)
Creatinine, Ser: 0.88 mg/dL (ref 0.44–1.00)
GFR, Estimated: >60 mL/min (ref >60)
Glucose, Bld: 155 mg/dL — ABNORMAL HIGH (ref 70–99)
Potassium: 4.3 mmol/L (ref 3.5–5.1)
Sodium: 129 mmol/L — ABNORMAL LOW (ref 135–145)
Total Bilirubin: <0.1 mg/dL — ABNORMAL LOW (ref 0.3–1.2)
Total Protein: 7.3 g/dL (ref 6.5–8.1)

## 2022-07-16 LAB — ETHANOL: Alcohol, Ethyl (B): <10 mg/dL

## 2022-07-16 LAB — GLUCOSE, CAPILLARY
Glucose-Capillary: 148 mg/dL — ABNORMAL HIGH (ref 70–99)
Glucose-Capillary: 114 mg/dL — ABNORMAL HIGH (ref 70–99)

## 2022-07-16 LAB — PROTIME-INR
INR: 1.1 (ref 0.8–1.2)
Prothrombin Time: 13.8 s (ref 11.4–15.2)

## 2022-07-16 LAB — LIPASE, BLOOD: Lipase: 30 U/L (ref 11–51)

## 2022-07-16 MED ORDER — ACETAMINOPHEN 10 MG/ML IV SOLN
1000.0000 mg | Freq: Once | INTRAVENOUS | Status: AC
  Administered 2022-07-16: 1000 mg via INTRAVENOUS
  Filled 2022-07-16: qty 100

## 2022-07-16 MED ORDER — SODIUM CHLORIDE 0.9 % IV SOLN: INTRAVENOUS | Status: AC

## 2022-07-16 MED ORDER — DULOXETINE HCL 30 MG PO CPEP
60.0000 mg | ORAL_CAPSULE | Freq: Two times a day (BID) | ORAL | Status: DC
  Administered 2022-07-17 – 2022-07-18 (×3): 60 mg via ORAL
  Filled 2022-07-16 (×3): qty 2

## 2022-07-16 MED ORDER — BUSPIRONE HCL 10 MG PO TABS
10.0000 mg | ORAL_TABLET | Freq: Two times a day (BID) | ORAL | Status: DC
  Administered 2022-07-17 – 2022-07-18 (×3): 10 mg via ORAL
  Filled 2022-07-16 (×3): qty 1

## 2022-07-16 MED ORDER — BUPROPION HCL ER (XL) 150 MG PO TB24
450.0000 mg | ORAL_TABLET | Freq: Every day | ORAL | Status: DC
  Administered 2022-07-17 – 2022-07-18 (×2): 450 mg via ORAL
  Filled 2022-07-16 (×2): qty 3

## 2022-07-16 MED ORDER — ASPIRIN 300 MG RE SUPP
300.0000 mg | Freq: Once | RECTAL | Status: AC
  Administered 2022-07-16: 300 mg via RECTAL
  Filled 2022-07-16: qty 1

## 2022-07-16 MED ORDER — STROKE: EARLY STAGES OF RECOVERY BOOK: Freq: Once | Status: AC

## 2022-07-16 MED ORDER — SENNOSIDES-DOCUSATE SODIUM 8.6-50 MG PO TABS
1.0000 | ORAL_TABLET | Freq: Every evening | ORAL | Status: DC | PRN
Start: 2022-07-16 — End: 2022-07-19

## 2022-07-16 MED ORDER — LAMOTRIGINE 100 MG PO TABS
150.0000 mg | ORAL_TABLET | Freq: Two times a day (BID) | ORAL | Status: DC
  Administered 2022-07-17 – 2022-07-18 (×3): 150 mg via ORAL
  Filled 2022-07-16: qty 1
  Filled 2022-07-16 (×2): qty 2

## 2022-07-16 MED ORDER — IOHEXOL 350 MG/ML SOLN
100.0000 mL | Freq: Once | INTRAVENOUS | Status: AC | PRN
  Administered 2022-07-16: 100 mL via INTRAVENOUS

## 2022-07-16 MED ORDER — ATORVASTATIN CALCIUM 20 MG PO TABS
20.0000 mg | ORAL_TABLET | Freq: Every day | ORAL | Status: DC
  Administered 2022-07-17: 20 mg via ORAL
  Filled 2022-07-16: qty 1

## 2022-07-16 MED ORDER — ASPIRIN 81 MG PO CHEW
81.0000 mg | CHEWABLE_TABLET | Freq: Every day | ORAL | Status: DC
  Administered 2022-07-17: 81 mg via ORAL
  Filled 2022-07-16: qty 1

## 2022-07-16 MED ORDER — ENOXAPARIN SODIUM 40 MG/0.4ML IJ SOSY
40.0000 mg | PREFILLED_SYRINGE | INTRAMUSCULAR | Status: DC
  Administered 2022-07-16 – 2022-07-17 (×2): 40 mg via SUBCUTANEOUS
  Filled 2022-07-16 (×2): qty 0.4

## 2022-07-16 MED ORDER — ACETAMINOPHEN 325 MG PO TABS
650.0000 mg | ORAL_TABLET | ORAL | Status: DC | PRN
  Administered 2022-07-17 – 2022-07-18 (×4): 650 mg via ORAL
  Filled 2022-07-16 (×5): qty 2

## 2022-07-16 MED ORDER — LEVOTHYROXINE SODIUM 112 MCG PO TABS
112.0000 ug | ORAL_TABLET | Freq: Every day | ORAL | Status: DC
  Administered 2022-07-18: 112 ug via ORAL
  Filled 2022-07-16 (×2): qty 1

## 2022-07-16 MED ORDER — SODIUM CHLORIDE 0.9% FLUSH: 3.0000 mL | Freq: Once | INTRAVENOUS | Status: DC

## 2022-07-16 MED ORDER — CLOPIDOGREL BISULFATE 75 MG PO TABS
75.0000 mg | ORAL_TABLET | Freq: Every day | ORAL | Status: DC
  Administered 2022-07-17: 75 mg via ORAL
  Filled 2022-07-16: qty 1

## 2022-07-16 MED ORDER — PROMETHAZINE HCL 25 MG/ML IJ SOLN
25.0000 mg | Freq: Once | INTRAMUSCULAR | Status: AC
  Administered 2022-07-16: 25 mg via INTRAMUSCULAR

## 2022-07-16 MED ORDER — STROKE: EARLY STAGES OF RECOVERY BOOK: Freq: Once | Status: DC

## 2022-07-16 MED ORDER — ACETAMINOPHEN 650 MG RE SUPP
650.0000 mg | RECTAL | Status: DC | PRN
  Administered 2022-07-17: 650 mg via RECTAL
  Filled 2022-07-16: qty 1

## 2022-07-16 MED ORDER — ACETAMINOPHEN 160 MG/5ML PO SOLN: 650.0000 mg | ORAL | Status: DC | PRN

## 2022-07-16 MED ORDER — INSULIN ASPART 100 UNIT/ML IJ SOLN
0.0000 [IU] | Freq: Three times a day (TID) | INTRAMUSCULAR | Status: DC
  Administered 2022-07-17: 3 [IU] via SUBCUTANEOUS
  Administered 2022-07-17: 5 [IU] via SUBCUTANEOUS
  Administered 2022-07-18 (×3): 3 [IU] via SUBCUTANEOUS
  Filled 2022-07-16 (×5): qty 1

## 2022-07-16 MED ORDER — PANTOPRAZOLE SODIUM 40 MG PO TBEC
40.0000 mg | DELAYED_RELEASE_TABLET | Freq: Every day | ORAL | Status: DC
  Administered 2022-07-17 – 2022-07-18 (×2): 40 mg via ORAL
  Filled 2022-07-16 (×2): qty 1

## 2022-07-16 MED ORDER — INSULIN GLARGINE-YFGN 100 UNIT/ML ~~LOC~~ SOLN
10.0000 [IU] | Freq: Every day | SUBCUTANEOUS | Status: DC
  Administered 2022-07-17: 10 [IU] via SUBCUTANEOUS
  Filled 2022-07-16: qty 0.1

## 2022-07-16 MED ORDER — INSULIN ASPART 100 UNIT/ML IJ SOLN: 0.0000 [IU] | Freq: Every day | INTRAMUSCULAR | Status: DC

## 2022-07-16 NOTE — ED Notes (Signed)
Patient was unable to urinate after 1 bottle of water on the way here and 2 cups of water while in office.

## 2022-07-16 NOTE — Assessment & Plan Note (Signed)
Admit for stroke workup - Permissive HTN x48 hrs from sx onset or until stroke ruled out by MRI goal BP <220/110. PRN labetalol or hydralazine if BP above these parameters. Avoid oral antihypertensives. - MRI brain wo contrast - TTE w/ bubble - Check A1c and LDL + add statin per guidelines - ASA 81mg  daily + plavix 75mg  daily x21 days f/b plavix 75mg  daily monotherapy after that when able to take po. ASA 300mg  PR daily for now. - q4 hr neuro checks - STAT head CT for any change in neuro exam - Tele - PT/OT/SLP - 2/2 severe dysarthria bedside swallow screen should not be performed; patient should remain NPO until cleared by SLP. - Stroke education - Amb referral to neurology upon discharge

## 2022-07-16 NOTE — Plan of Care (Signed)
  Problem: Coping: Goal: Will verbalize positive feelings about self Outcome: Progressing   Problem: Education: Goal: Knowledge of disease or condition will improve Outcome: Progressing Goal: Knowledge of secondary prevention will improve (SELECT ALL) Outcome: Progressing   Problem: Skin Integrity: Goal: Risk for impaired skin integrity will decrease 07/16/2022 2251 by Samella Parr, RN Outcome: Progressing 07/16/2022 2250 by Samella Parr, RN Outcome: Progressing   Problem: Clinical Measurements: Goal: Ability to maintain clinical measurements within normal limits will improve 07/16/2022 2251 by Samella Parr, RN Outcome: Progressing 07/16/2022 2250 by Samella Parr, RN Outcome: Progressing Goal: Will remain free from infection 07/16/2022 2251 by Samella Parr, RN Outcome: Progressing 07/16/2022 2250 by Samella Parr, RN Outcome: Progressing Goal: Diagnostic test results will improve 07/16/2022 2251 by Samella Parr, RN Outcome: Progressing 07/16/2022 2250 by Samella Parr, RN Outcome: Progressing Goal: Respiratory complications will improve 07/16/2022 2251 by Samella Parr, RN Outcome: Progressing 07/16/2022 2250 by Samella Parr, RN Outcome: Progressing Goal: Cardiovascular complication will be avoided 07/16/2022 2251 by Samella Parr, RN Outcome: Progressing 07/16/2022 2250 by Samella Parr, RN Outcome: Progressing   Problem: Clinical Measurements: Goal: Will remain free from infection 07/16/2022 2251 by Samella Parr, RN Outcome: Progressing 07/16/2022 2250 by Samella Parr, RN Outcome: Progressing

## 2022-07-16 NOTE — ED Notes (Signed)
EMS called for transport to hospital.

## 2022-07-16 NOTE — ED Notes (Signed)
Report given to Alleen Borne, RN. Patient currently in MRI. MRI called and state they will place for patient to be transported to room 123.

## 2022-07-16 NOTE — ED Notes (Signed)
EMS here for transport

## 2022-07-16 NOTE — ED Triage Notes (Signed)
Patient reports lower back pain that started about 2 weeks ago.   Patient denies any injury to her back.

## 2022-07-16 NOTE — Assessment & Plan Note (Signed)
Hold antihypertensives to allow for permissive hypertension 

## 2022-07-16 NOTE — Assessment & Plan Note (Addendum)
Continue buspirone and duloxetine and Lamictal

## 2022-07-16 NOTE — ED Notes (Signed)
Patient is being discharged from the Urgent Care and sent to the Emergency Department via EMS . Per Dr.Brimmage, patient is in need of higher level of care due to sudden change in speech. Patient is aware and verbalizes understanding of plan of care.  Vitals:   07/16/22 1643  BP: 130/78  Pulse: 80  Temp: 97.9 F (36.6 C)  SpO2: 97%

## 2022-07-16 NOTE — ED Provider Notes (Signed)
[facility]    CSN: [id number] Arrival date & time: 07/16/22  1631      History   Chief Complaint Chief Complaint  Patient presents with   Back Pain    Lower back     HPI Tamara [name] is a 76 y.o. female.   HPI  Tamara [name] presents with caregiver for worsening back pain that started 2 weeks ago.  Patient was seen by her orthopedic provider and had normal x-rays this week.  Patient reports that she has been having difficulty going to the bathroom.  She drink water prior to arrival.  No burning but urinary frequency and urgency this more than normal. She uses a wheelchair to get around.    RN pulled me aside and asked me to see the patient in the bathroom because he had began vomiting.  She has not been eating well.  On further questioning, patient fell last evening.  She bumped her head.  Female caregiver is unsure if pt passed out.  She was not seen directly after the fall.  Her caregiver notes that she has been intermittently speaking differently.    Past Medical History:  Diagnosis Date   Anemia    Chronic kidney disease    Depression    Diabetes mellitus without complication ([place])    GERD (gastroesophageal reflux disease)    Hypertension    Hypothyroidism     Patient Active Problem List   Diagnosis Date Noted   Chronic back pain 07/17/2022   Acute CVA (cerebrovascular accident) ([place]) 07/16/2022   Hyponatremia 07/16/2022   AKI (acute kidney injury) ([place]) 05/08/2022   Anemia 05/08/2022   Chronic kidney disease 05/08/2022   Depression 05/08/2022   Generalized weakness 05/08/2022   Hyperglycemia 05/08/2022   Hypoglycemia 05/08/2022   Cervical spondylosis with myelopathy 07/11/2021   Anxiety, mild 05/16/2021   Loss of memory 05/16/2021   Body mass index (BMI) 31.0-31.9, adult 04/11/2021   Hx of transient ischemic attack (TIA) 04/08/2021   Mild cognitive impairment of uncertain or unknown etiology 04/07/2021   Other spondylosis with myelopathy, thoracic  region 04/07/2021   Dysarthria 03/31/2021   Frequent falls 03/30/2021   Gastroesophageal reflux disease 11/30/2020   Diabetes mellitus ([place]) 11/30/2020   Long term (current) use of insulin ([place]) 11/30/2020   Long term (current) use of oral hypoglycemic drugs 11/30/2020   Obesity, unspecified 11/30/2020   Other long term (current) drug therapy 11/30/2020   Unspecified osteoarthritis, unspecified site 11/30/2020   Osteoarthritis 11/30/2020   Polycystic ovarian syndrome 11/30/2020   Pure hypercholesterolemia, unspecified 11/30/2020   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 11/30/2020   Depression, unspecified 11/30/2020   Risk for falls 08/30/2019   Essential hypertension 06/14/2019   Chronic thumb pain, left 12/02/2018   Type 2 diabetes mellitus with diabetic chronic kidney disease ([place]) 12/02/2018   Hypertension associated with type 2 diabetes mellitus ([place]) 12/02/2018   Hyperlipidemia associated with type 2 diabetes mellitus ([place]) 12/02/2018   Chronic kidney disease, stage 3 unspecified ([place]) 09/30/2017   Urinary incontinence in female 08/16/2017   Diabetic neuropathy ([place]) 06/10/2017   Essential tremor 06/10/2017   Fusion of spine of thoracic region 02/15/2017   Sleep apnea 02/05/2017   History of MRSA infection 02/05/2017   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   Status post lumbar spinal fusion 02/05/2017   Mixed action and resting tremor 09/24/2016   Unsteady gait 09/24/2016   Orthostatic hypotension 03/09/2016  History of cervical spinal surgery 03/08/2016   Abnormal cardiovascular stress test 02/20/2016   Spinal stenosis of lumbar region without neurogenic claudication 01/31/2014   Varicose veins of both lower extremities 02/17/2013   GERD (gastroesophageal reflux disease) 11/04/2012   Obesity 10/11/2012   Mixed hyperlipidemia 06/11/2011   Acquired hypothyroidism 06/11/2011   History of colonic  polyps 06/11/2011   History of polycystic ovarian syndrome 06/11/2011   Rotator cuff tear 06/11/2011   Primary hypothyroidism 06/11/2011    Past Surgical History:  Procedure Laterality Date   ACHILLES TENDON [place]      OB History   No obstetric history on file.      Home Medications    Prior to Admission medications   Medication Sig Start Date End Date Taking? Authorizing Provider  acetaminophen (TYLENOL) 325 MG tablet Take 650 mg by mouth every 6 (six) hours as needed.    [provider]  ARIPiprazole (ABILIFY) 5 MG tablet Take 5 mg by mouth at bedtime. 07/15/22   [provider]  aspirin 81 MG chewable tablet Chew 81 mg by mouth daily.    [provider]  atorvastatin (LIPITOR) 20 MG tablet Take 20 mg by mouth at bedtime. 04/14/22   [provider]  blood glucose meter kit and supplies KIT Dispense based on patient and insurance preference. Use up to four times daily as directed. 05/10/22   Little Ishikawa, MD  BuPROPion HCl ER, XL, 450 MG TB24 Take 450 mg by mouth daily.    [provider]  busPIRone (BUSPAR) 10 MG tablet Take 10 mg by mouth 2 (two) times daily. 03/19/22   [provider]  diclofenac Sodium (VOLTAREN) 1 % GEL Apply 2 g topically 4 (four) times daily. 02/27/22 02/27/23  [provider]  Dulaglutide (TRULICITY) 1.5 SR/1.5XY SOPN Inject 0.5 mLs into the skin once a week.    [provider]  DULoxetine (CYMBALTA) 60 MG capsule Take 60 mg by mouth 2 (two) times daily. 12/26/18   [provider]  esomeprazole (NEXIUM) 20 MG capsule Take 20 mg by mouth daily. To prevent coughing after eating. 04/30/22   [provider]  fluticasone (FLONASE) 50 MCG/ACT nasal spray Place 1 spray into both nostrils 2 (two) times daily. 02/13/22   [provider]  insulin glargine (LANTUS) 100 UNIT/ML Solostar Pen Inject 10 Units into the  skin daily. 05/10/22   Little Ishikawa, MD  lamoTRIgine (LAMICTAL) 150 MG tablet Take 150 mg by mouth 2 (two) times daily. 05/06/22   [provider]  levothyroxine (SYNTHROID) 112 MCG tablet Take 112 mcg by mouth daily. 03/15/22   [provider]  metFORMIN (GLUCOPHAGE) 500 MG tablet Take 500 mg by mouth 2 (two) times daily. 02/01/22   [provider]  propranolol (INDERAL) 40 MG tablet Take 0.5 tablets (20 mg total) by mouth 2 (two) times daily. 05/10/22 07/16/22  Little Ishikawa, MD  tiZANidine (ZANAFLEX) 2 MG tablet Take 0.5 tablets (1 mg total) by mouth at bedtime as needed. 05/10/22   Little Ishikawa, MD  valsartan (DIOVAN) 80 MG tablet Take 80 mg by mouth daily.    [provider]  methylphenidate (RITALIN) 10 MG tablet  08/15/18 09/21/19  [provider]    Family History Family History  Adopted: Yes  Problem Relation Age of Onset   Cancer Mother    Diabetes  Mother     Social History Social History   Tobacco Use   Smoking status: Never   Smokeless tobacco: Never  Vaping Use   Vaping Use: Never used  Substance Use Topics   Alcohol use: No   Drug use: No     Allergies   Ace inhibitors   Review of Systems Review of Systems  Unable to perform ROS: Acuity of condition     Physical Exam Triage Vital Signs ED Triage Vitals  Enc Vitals Group     BP 07/16/22 1643 130/78     Pulse Rate 07/16/22 1643 80     Resp --      Temp 07/16/22 1643 97.9 F (36.6 C)     Temp Source 07/16/22 1643 Oral     SpO2 07/16/22 1643 97 %     Weight 07/16/22 1641 160 lb (72.6 kg)     Height 07/16/22 1641 _0  (1.702 m)     Head Circumference --      Peak Flow --      Pain Score 07/16/22 1641 5     Pain Loc --      Pain Edu? --      Excl. in [place]? --    No data found.  Updated Vital Signs BP 130/78 (BP Location: Left Arm)   Pulse 80   Temp 97.9 F (36.6 C) (Oral)   Ht _1  (1.702 m)   Wt 72.6 kg   SpO2 97%   BMI 25.06  kg/m   Visual Acuity Right Eye Distance:   Left Eye Distance:   Bilateral Distance:    Right Eye Near:   Left Eye Near:    Bilateral Near:     Physical Exam  GEN:     alert, sitting in wheelchair, slurred speech  HENT: Hematoma on forehead, dry mucus membranes, oropharyngeal without lesions or erythema,  nares patent, no nasal discharge  EYES:   pupils equal and reactive, EOM intact NECK:  supple, normal ROM RESP:  clear to auscultation bilaterally, no increased work of breathing  CVS:   regular rate and rhythm ABD:  soft, non-tender; bowel sounds present EXT:  Seated in wheelchair, bilateral upper and lower extremities 5/5 NEURO: Slurred speech, alert, oriented to person and time not place, gross sensation intact, CN 2-12 grossly intact SKIN:   warm and dry, abrasions on right lower extremity     UC Treatments / Results  Labs (all labs ordered are listed, but only abnormal results are displayed) Labs Reviewed  GLUCOSE, CAPILLARY - Abnormal; Notable for the following components:      Result Value   Glucose-Capillary 148 (*)    All other components within normal limits  COMPREHENSIVE METABOLIC PANEL - Abnormal; Notable for the following components:   Sodium 129 (*)    Chloride 92 (*)    Glucose, Bld 155 (*)    Total Bilirubin <0.1 (*)    All other components within normal limits  CBC WITH DIFFERENTIAL/PLATELET - Abnormal; Notable for the following components:   WBC 10.9 (*)    Hemoglobin 11.6 (*)    HCT 35.6 (*)    Neutro Abs 8.0 (*)    All other components within normal limits  LIPASE, BLOOD    EKG   Radiology   Procedures Procedures (including critical care time)  Medications Ordered in UC Medications  promethazine (PHENERGAN) injection 25 mg (25 mg Intramuscular Given 07/16/22 1738)    Initial Impression / Assessment and Plan /  UC Course  I have reviewed the triage vital signs and the nursing notes.  Pertinent labs & imaging results that were  available during my care of the patient were reviewed by me and considered in my medical decision making (see chart for details).      Pt is a 76 y.o.  female with history of type 2 diabetes, hypertension, hypothyroidism who presented for lumbar pain.  She has had normal x-rays at her orthopedic providers office earlier this week.  She was seen by her orthopedic surgeon on 07/08/2022 for lumbar radiculopathy.    We will try to give a urine sample patient had an acute change here at the urgent care.  Vital signs stable.  Patient vomited in the bathroom.  RN called provider into the bathroom.  Upon arrival patient sitting in wheelchair with vomitus on the floor.  Asked nursing staff for CBG which was glu 148.  CBC and CMP collected.  Caregiver arrived to the bathroom door, states that patient had been drinking water on arrival and then drink more water here and she feels like that may be causing her symptoms.  Notes that she has not been using the bathroom. Patient was trying to give [place] a urine sample however she started having slurred speech.  Neuro exam was remarkable for dysarthria.  I could not pinpoint any focal deficits.  Patient needed to be transferred to the ED for stroke evaluation.   In speaking with her caregiver, pt fell last night and hit her head.  She came in today for lumbar pain.  Work-up was truncated due to acute change and EMS was called.  EMS updated on patient's status.  Spoke with [facility] charge nurse to let them know the patient will be arriving for concern stroke. Patient     Final Clinical Impressions(s) / UC Diagnoses   Final diagnoses:  Altered mental status, unspecified altered mental status type  Dysarthria   Discharge Instructions   None    ED Prescriptions   None    PDMP not reviewed this encounter.   Lyndee Hensen, DO 07/17/22 1419

## 2022-07-16 NOTE — ED Triage Notes (Signed)
Pt to ED via [facility] from [facility]. Pt had a fall and hit the left side of her head last night and went to be seen for back pain. Pt was at [facility] and started feeling funny, vomited once and started having slurred speech. In route with EMS pt started having aphasia. CODE stroke called. CBG 145 on arrival

## 2022-07-16 NOTE — Progress Notes (Addendum)
Code Stroke activated @ 1839 from CT. [facility] neuro paged at 1842, Dr. Selina Cooley on camera @ [phone].  No TNK per Dr. Selina Cooley.  Sending back for advanced imaging.  Telestroke Charity fundraiser

## 2022-07-16 NOTE — Assessment & Plan Note (Signed)
Continue levothyroxine.  Will hold propranolol for permissive hypertension

## 2022-07-16 NOTE — Assessment & Plan Note (Signed)
Generalized weakness with ambulatory dysfunction and physical deconditioning and frailty Physical therapy evaluation Fall precautions

## 2022-07-16 NOTE — ED Provider Notes (Signed)
[facility] Provider Note    Event Date/Time   First MD Initiated Contact with Patient 07/16/22 1839     (approximate)   History   Chief Complaint: Code Stroke   HPI  Tamara [name] is a 76 y.o. female with a history of diabetes, hypertension, hypothyroidism, CKD who was brought to the ED by EMS from urgent care due to suspected stroke.  Patient had a fall last night in her home where she lives alone.  She went to urgent care today where she started to feel dizzy, vomited once, and then suddenly developed dysarthria.  Patient denies any neck pain or chest pain.     Physical Exam   Triage Vital Signs: ED Triage Vitals  Enc Vitals Group     BP      Pulse      Resp      Temp      Temp src      SpO2      Weight      Height      Head Circumference      Peak Flow      Pain Score      Pain Loc      Pain Edu?      Excl. in [place]?     Most recent vital signs: Vitals:   07/16/22 1910 07/16/22 1915  BP:    Pulse: 84 86  Resp: 18 16  SpO2: 98%     General: Awake, no distress.  CV:  Good peripheral perfusion.  Regular rate and rhythm Resp:  Normal effort.  Clear to auscultation bilaterally Abd:  No distention.  Soft and nontender Other:  There is tenderness at the left lateral pelvis.  No pain with passive range of motion of the hip.  Long bones are stable.  Head appears atraumatic.  No midline spinal tenderness.  Patient exhibits dysarthria, right-sided facial droop, impaired coordination on the left, and stuttering speech difficulty. NIHSS 3   ED Results / Procedures / Treatments   Labs (all labs ordered are listed, but only abnormal results are displayed) Labs Reviewed  CBC - Abnormal; Notable for the following components:      Result Value   WBC 12.2 (*)    Hemoglobin 11.8 (*)    All other components within normal limits  DIFFERENTIAL - Abnormal; Notable for the following components:   Neutro Abs 9.6 (*)    Abs Immature Granulocytes  0.08 (*)    All other components within normal limits  PROTIME-INR  APTT  COMPREHENSIVE METABOLIC PANEL  ETHANOL  I-STAT CREATININE, ED  CBG MONITORING, ED     EKG Interpreted by me Sinus rhythm rate of 86.  Normal axis, first-degree AV block.  Normal ST segments and T waves.  No ischemic changes.   RADIOLOGY CT head interpreted by me, negative for mass or intracranial hemorrhage or acute infarct.  Discussed with radiology.  Radiology report reviewed.   PROCEDURES:  .1-3 Lead EKG Interpretation  Performed by: Sharman Cheek, MD Authorized by: Sharman Cheek, MD     Interpretation: normal     ECG rate:  80   ECG rate assessment: normal     Rhythm: sinus rhythm     Ectopy: none     Conduction: normal   Comments:        .Critical Care  Performed by: Sharman Cheek, MD Authorized by: Sharman Cheek, MD   Critical care provider statement:    Critical care time (  minutes):  35   Critical care time was exclusive of:  Separately billable procedures and treating other patients   Critical care was necessary to treat or prevent imminent or life-threatening deterioration of the following conditions:  CNS failure or compromise and trauma   Critical care was time spent personally by me on the following activities:  Development of treatment plan with patient or surrogate, discussions with consultants, evaluation of patient's response to treatment, examination of patient, obtaining history from patient or surrogate, ordering and performing treatments and interventions, ordering and review of laboratory studies, ordering and review of radiographic studies, pulse oximetry, re-evaluation of patient's condition and review of old charts   Care discussed with: admitting provider      MEDICATIONS ORDERED IN ED: Medications  sodium chloride flush (NS) 0.9 % injection 3 mL (has no administration in time range)  iohexol (OMNIPAQUE) 350 MG/ML injection 100 mL (100 mLs Intravenous  Contrast Given 07/16/22 1928)     IMPRESSION / MDM / ASSESSMENT AND PLAN / ED COURSE  I reviewed the triage vital signs and the nursing notes.                              Differential diagnosis includes, but is not limited to, subdural hematoma, cerebral hemorrhage, ischemic stroke, intracranial mass, AKI, electrolyte abnormality, anemia, alcohol intoxication  Patient's presentation is most consistent with acute presentation with potential threat to life or bodily function.  Patient presents with neurologic findings concerning for ischemic stroke.  Also with recent trauma which occurred yesterday, raising concern that neurologic insult occurred will beyond the safe administration window for thrombolytics.  Discussed with neurology Dr. Selina Cooley who advises that presentation is consistent with ischemic stroke but due to uncertain onset timeframe, lack of collateral information, inability to obtain appropriate informed consent from the patient, thrombolytics are not recommended.  Will obtain CTA/CTP and admit for ongoing stroke management.       FINAL CLINICAL IMPRESSION(S) / ED DIAGNOSES   Final diagnoses:  Acute ischemic stroke (HCC)  Type 2 diabetes mellitus without complication, with long-term current use of insulin (HCC)     Rx / DC Orders   ED Discharge Orders     None        Note:  This document was prepared using Dragon voice recognition software and may include unintentional dictation errors.   Sharman Cheek, MD 07/16/22 [phone]

## 2022-07-16 NOTE — H&P (Signed)
History and Physical    Patient: Tamara [name] RXV:[id number] DOB: 07-07-1946 DOA: 07/16/2022 DOS: the patient was seen and examined on 07/16/2022 PCP: [facility]  Patient coming from: Home  Chief Complaint:  Chief Complaint  Patient presents with   Code Stroke    HPI: Tamara [name] is a 76 y.o. female with medical history significant for Insulin-dependent type 2 diabetes, depression and anxiety on lamictal, prior subdural hematuria, memory impairment, hospitalized in June 2023 with recurrent hypoglycemia related to medication, , CKD,  back surgeries with chronic pain, ambulatory dysfunction and ffalls, ambulant at baseline with a walker, who presents to the ED from urgent care as a code stroke after presenting with dysarthria following a fall the night prior in which she hit her head.  She initially complained of back pain but on arrival to urgent care she mentioned that she had been having difficulty getting her words out and she was noted at times to be intelligible.  Speech change occurred at 1610 on 8/17.  The circumstances of the fall was difficult to elucidate.  Patient said who foods were getting tangled up because her mouth was very dry.  She said she was on a diet and did a colonoscopy prep, however chart review does not reveal an encounter for colonoscopy ED course and data review: BP on arrival was 144/121, improving on its own to 100/67 with otherwise normal vitals.  Labs significant for hyponatremia of 129, mild leukocytosis of 12,000 and hemoglobin 11.8, otherwise unremarkable.  EKG, personally viewed and interpreted showed sinus at 86 with no acute ST-T wave changes. CT head code stroke protocol showed no acute abnormalities and CTA head and neck showed no emergent LVO or high-grade stenosis.  It did show at 3 mL perfusion deficit in the right temporal lobe of questionable accuracy an MRI was recommended. Neurology consult: Patient was seen and evaluated by neurologist,  Dr. Quinn Axe who did not feel patient to be an appropriate TNK candidate in spite of new onset of neurologic deficits due to inability to determine last known well, no LVO on CTA, and indeterminate area on CT PE.  Hospitalist consulted for admission.  NIHSS at the time of neurology evaluation was 9.    Past Medical History:  Diagnosis Date   Anemia    Chronic kidney disease    Depression    Diabetes mellitus without complication (HCC)    GERD (gastroesophageal reflux disease)    Hypertension    Hypothyroidism    Past Surgical History:  Procedure Laterality Date   ACHILLES TENDON REPAIR     BACK SURGERY     HERNIA REPAIR     SHOULDER SURGERY     Social History:  reports that she has never smoked. She has never used smokeless tobacco. She reports that she does not drink alcohol and does not use drugs.  Allergies  Allergen Reactions   Ace Inhibitors Cough    Family History  Adopted: Yes  Problem Relation Age of Onset   Cancer Mother    Diabetes Mother     Prior to Admission medications   Medication Sig Start Date End Date Taking? Authorizing Provider  acetaminophen (TYLENOL) 325 MG tablet Take 650 mg by mouth every 6 (six) hours as needed.    [provider]  aspirin 81 MG chewable tablet Chew by mouth daily.    [provider]  atorvastatin (LIPITOR) 20 MG tablet Take 20 mg by mouth at bedtime. 04/14/22   [provider]  blood glucose meter kit and supplies KIT Dispense based on patient and insurance preference. Use up to four times daily as directed. 05/10/22   Little Ishikawa, MD  BuPROPion HCl ER, XL, 450 MG TB24 Take 450 mg by mouth daily.    [provider]  busPIRone (BUSPAR) 10 MG tablet Take 10 mg by mouth 2 (two) times daily. 03/19/22   [provider]  diclofenac Sodium (VOLTAREN) 1 % GEL Apply 2 g topically 4 (four) times daily. 02/27/22 02/27/23  [provider]  DULoxetine (CYMBALTA) 60 MG capsule Take 60 mg by  mouth 2 (two) times daily. 12/26/18   [provider]  esomeprazole (NEXIUM) 20 MG capsule Take 20 mg by mouth daily. To prevent coughing after eating. 04/30/22   [provider]  fluticasone (FLONASE) 50 MCG/ACT nasal spray Place 1 spray into both nostrils 2 (two) times daily. 02/13/22   [provider]  insulin glargine (LANTUS) 100 UNIT/ML Solostar Pen Inject 10 Units into the skin daily. 05/10/22   Little Ishikawa, MD  lamoTRIgine (LAMICTAL) 150 MG tablet Take 150 mg by mouth 2 (two) times daily. 05/06/22   [provider]  levothyroxine (SYNTHROID) 112 MCG tablet Take 112 mcg by mouth daily. 03/15/22   [provider]  metFORMIN (GLUCOPHAGE) 500 MG tablet Take 500 mg by mouth 2 (two) times daily. 02/01/22   [provider]  propranolol (INDERAL) 40 MG tablet Take 0.5 tablets (20 mg total) by mouth 2 (two) times daily. 05/10/22 06/09/22  Little Ishikawa, MD  tiZANidine (ZANAFLEX) 2 MG tablet Take 0.5 tablets (1 mg total) by mouth at bedtime as needed. 05/10/22   Little Ishikawa, MD  methylphenidate (RITALIN) 10 MG tablet  08/15/18 09/21/19  [provider]    Physical Exam: Vitals:   07/16/22 1905 07/16/22 1910 07/16/22 1915 07/16/22 1951  BP: (!) 144/121   100/67  Pulse:  84 86 87  Resp:  '18 16 16  ' Temp:    98.2 F (36.8 C)  TempSrc:    Oral  SpO2:  98%  93%   Physical Exam Vitals and nursing note reviewed.  Constitutional:      General: She is not in acute distress. HENT:     Head: Normocephalic and atraumatic.     Mouth/Throat:     Mouth: Mucous membranes are dry.  Cardiovascular:     Rate and Rhythm: Normal rate and regular rhythm.     Heart sounds: Normal heart sounds.  Pulmonary:     Effort: Pulmonary effort is normal.     Breath sounds: Normal breath sounds.  Abdominal:     Palpations: Abdomen is soft.     Tenderness: There is no abdominal tenderness.  Neurological:     Comments: Speech somewhat  slurred  Psychiatric:        Mood and Affect: Mood is anxious.     Labs on Admission: I have personally reviewed following labs and imaging studies  CBC: Recent Labs  Lab 07/16/22 1751 07/16/22 1914  WBC 10.9* 12.2*  NEUTROABS 8.0* 9.6*  HGB 11.6* 11.8*  HCT 35.6* 36.7  MCV 84.4 83.6  PLT 394 099   Basic Metabolic Panel: Recent Labs  Lab 07/16/22 1751 07/16/22 1914  NA 129* 129*  K 4.3 4.6  CL 92* 91*  CO2 27 26  GLUCOSE 155* 148*  BUN 22 22  CREATININE 0.88 0.82  CALCIUM 9.3 9.5   GFR: Estimated Creatinine Clearance: 57.6 mL/min (  by C-G formula based on SCr of 0.82 mg/dL). Liver Function Tests: Recent Labs  Lab 07/16/22 1751 07/16/22 1914  AST 22 33  ALT 19 18  ALKPHOS 86 83  BILITOT <0.1* 1.0  PROT 7.3 7.5  ALBUMIN 4.0 4.1   Recent Labs  Lab 07/16/22 1751  LIPASE 30   No results for input(s): "AMMONIA" in the last 168 hours. Coagulation Profile: Recent Labs  Lab 07/16/22 1914  INR 1.1   Cardiac Enzymes: No results for input(s): "CKTOTAL", "CKMB", "CKMBINDEX", "TROPONINI" in the last 168 hours. BNP (last 3 results) No results for input(s): "PROBNP" in the last 8760 hours. HbA1C: No results for input(s): "HGBA1C" in the last 72 hours. CBG: Recent Labs  Lab 07/16/22 1729  GLUCAP 148*   Lipid Profile: No results for input(s): "CHOL", "HDL", "LDLCALC", "TRIG", "CHOLHDL", "LDLDIRECT" in the last 72 hours. Thyroid Function Tests: No results for input(s): "TSH", "T4TOTAL", "FREET4", "T3FREE", "THYROIDAB" in the last 72 hours. Anemia Panel: No results for input(s): "VITAMINB12", "FOLATE", "FERRITIN", "TIBC", "IRON", "RETICCTPCT" in the last 72 hours. Urine analysis:    Component Value Date/Time   COLORURINE YELLOW (A) 05/08/2022 1754   APPEARANCEUR HAZY (A) 05/08/2022 1754   LABSPEC 1.018 05/08/2022 1754   PHURINE 5.0 05/08/2022 1754   GLUCOSEU NEGATIVE 05/08/2022 1754   HGBUR NEGATIVE 05/08/2022 1754   BILIRUBINUR NEGATIVE 05/08/2022  1754   KETONESUR NEGATIVE 05/08/2022 1754   PROTEINUR 30 (A) 05/08/2022 1754   NITRITE NEGATIVE 05/08/2022 1754   LEUKOCYTESUR NEGATIVE 05/08/2022 1754    Radiological Exams on Admission: DG Pelvis 1-2 Views  Result Date: 07/16/2022 CLINICAL DATA:  Left hip pain EXAM: PELVIS - 1-2 VIEW COMPARISON:  Radiographs 06/05/2021 FINDINGS: No fracture or dislocation. Degenerative changes pubic symphysis, both hips, SI joints and lower lumbar spine. Lumbar spine fusion hardware. Enteric contrast within the bladder from CT earlier today. IMPRESSION: No acute fracture or dislocation. Moderate bilateral hip degenerative arthritis. Electronically Signed   By: Placido Sou M.D.   On: 07/16/2022 20:18   CT ANGIO HEAD NECK W WO CM W PERF (CODE STROKE)  Result Date: 07/16/2022 CLINICAL DATA:  Slurred speech and right facial droop EXAM: CT ANGIOGRAPHY HEAD AND NECK CT PERFUSION BRAIN TECHNIQUE: Multidetector CT imaging of the head and neck was performed using the standard protocol during bolus administration of intravenous contrast. Multiplanar CT image reconstructions and MIPs were obtained to evaluate the vascular anatomy. Carotid stenosis measurements (when applicable) are obtained utilizing NASCET criteria, using the distal internal carotid diameter as the denominator. Multiphase CT imaging of the brain was performed following IV bolus contrast injection. Subsequent parametric perfusion maps were calculated using RAPID software. RADIATION DOSE REDUCTION: This exam was performed according to the departmental dose-optimization program which includes automated exposure control, adjustment of the mA and/or kV according to patient size and/or use of iterative reconstruction technique. CONTRAST:  172m OMNIPAQUE IOHEXOL 350 MG/ML SOLN COMPARISON:  None Available. FINDINGS: CTA NECK FINDINGS SKELETON: There is no bony spinal canal stenosis. No lytic or blastic lesion. OTHER NECK: Normal pharynx, larynx and major salivary  glands. No cervical lymphadenopathy. Unremarkable thyroid gland. UPPER CHEST: No pneumothorax or pleural effusion. No nodules or masses. AORTIC ARCH: There is no calcific atherosclerosis of the aortic arch. There is no aneurysm, dissection or hemodynamically significant stenosis of the visualized portion of the aorta. Normal variant aortic arch branching pattern with the left vertebral artery arising independently from the aortic arch. The visualized proximal subclavian arteries are widely patent. RIGHT CAROTID  SYSTEM: Normal without aneurysm, dissection or stenosis. LEFT CAROTID SYSTEM: Normal without aneurysm, dissection or stenosis. VERTEBRAL ARTERIES: Left dominant configuration. Both origins are clearly patent. There is no dissection, occlusion or flow-limiting stenosis to the skull base (V1-V3 segments). CTA HEAD FINDINGS POSTERIOR CIRCULATION: --Vertebral arteries: Normal V4 segments. --Inferior cerebellar arteries: Normal. --Basilar artery: Normal. --Superior cerebellar arteries: Normal. --Posterior cerebral arteries (PCA): Normal. ANTERIOR CIRCULATION: --Intracranial internal carotid arteries: Normal. --Anterior cerebral arteries (ACA): Normal. Absent right A1 segment, normal variant --Middle cerebral arteries (MCA): Normal. VENOUS SINUSES: As permitted by contrast timing, patent. ANATOMIC VARIANTS: None Review of the MIP images confirms the above findings. CT Brain Perfusion Findings: ASPECTS: 10 CBF (<30%) Volume: 4m Perfusion (Tmax>6.0s) volume: 364mMismatch Volume: 47m65mnfarction Location:No core infarct. The perfusion deficit is located in the right temporal lobe but is of questionable accuracy. MRI would provide a more definitive answer. IMPRESSION: 1. No emergent large vessel occlusion or high-grade stenosis of the intracranial arteries. 2. No core infarct. 3 mL perfusion deficit is located in the right temporal lobe but is of questionable accuracy. MRI would provide a more definitive answer.  Electronically Signed   By: KevUlyses JarredD.   On: 07/16/2022 19:53   CT HEAD CODE STROKE WO CONTRAST  Result Date: 07/16/2022 CLINICAL DATA:  Code stroke.  Acute neuro deficit.  Slurred speech EXAM: CT HEAD WITHOUT CONTRAST TECHNIQUE: Contiguous axial images were obtained from the base of the skull through the vertex without intravenous contrast. RADIATION DOSE REDUCTION: This exam was performed according to the departmental dose-optimization program which includes automated exposure control, adjustment of the mA and/or kV according to patient size and/or use of iterative reconstruction technique. COMPARISON:  CT head 05/08/2022 FINDINGS: Brain: Mild atrophy. Mild white matter hypodensity bilaterally appears chronic. Negative for acute infarct, hemorrhage, mass. Vascular: Negative for hyperdense vessel Skull: Negative Sinuses/Orbits: Paranasal sinuses clear.  Negative orbit Other: None ASPECTS ([place] Program Early CT Score) - Ganglionic level infarction (caudate, lentiform nuclei, internal capsule, insula, M1-M3 cortex): 7 - Supraganglionic infarction (M4-M6 cortex): 3 Total score (0-10 with 10 being normal): 10 IMPRESSION: 1. No acute intracranial abnormality 2. ASPECTS is 10 3. Mild atrophy and mild chronic microvascular ischemic change in the white matter. 4. These results were called by telephone at the time of interpretation on 07/16/2022 at 6:50 pm to provider PHILLIP STAFFORD , who verbally acknowledged these results. Electronically Signed   By: ChaFranchot GalloD.   On: 07/16/2022 18:51     Data Reviewed: Relevant notes from primary care and specialist visits, past discharge summaries as available in EHR, including Care Everywhere. Prior diagnostic testing as pertinent to current admission diagnoses Updated medications and problem lists for reconciliation ED course, including vitals, labs, imaging, treatment and response to treatment Triage notes, nursing and pharmacy notes and ED  provider's notes Notable results as noted in HPI   Assessment and Plan: * Acute CVA (cerebrovascular accident) ([facility] for stroke workup - Permissive HTN x48 hrs from sx onset or until stroke ruled out by MRI goal BP <220/110. PRN labetalol or hydralazine if BP above these parameters. Avoid oral antihypertensives. - MRI brain wo contrast - TTE w/ bubble - Check A1c and LDL + add statin per guidelines - ASA 70m69mily + plavix 75mg27mly x21 days f/b plavix 75mg 40my monotherapy after that when able to take po. ASA 300mg P56mily for now. - q4 hr neuro checks - STAT head CT for any change in neuro exam -  Tele - PT/OT/SLP - 2/2 severe dysarthria bedside swallow screen should not be performed; patient should remain NPO until cleared by SLP. - Stroke education - Amb referral to neurology upon discharge   Hyponatremia Uncertain etiology, suspecting hypovolemic. Likely related to dehydration from colonoscopy prep over the past two days(per patient report, though information might be unreliable) Will hydrate with NS and monitor Monitor sodium and follow up osmolality and urine sodium  Diabetes mellitus (HCC) Sliding scale insulin coverage  Frequent falls Generalized weakness with ambulatory dysfunction and physical deconditioning and frailty Physical therapy evaluation Fall precautions  Essential hypertension Hold antihypertensives to allow for permissive hypertension  Acquired hypothyroidism Continue levothyroxine.  Will hold propranolol for permissive hypertension  Depression Continue buspirone and duloxetine and Lamictal  Chronic back pain Continue Cymbalta and acetaminophen        DVT prophylaxis: Lovenox  Consults: neurology, [facility]  Advance Care Planning:   Code Status: Prior   Family Communication: none  Disposition Plan: Back to previous home environment  Severity of Illness: The appropriate patient status for this patient is INPATIENT. Inpatient  status is judged to be reasonable and necessary in order to provide the required intensity of service to ensure the patient's safety. The patient's presenting symptoms, physical exam findings, and initial radiographic and laboratory data in the context of their chronic comorbidities is felt to place them at high risk for further clinical deterioration. Furthermore, it is not anticipated that the patient will be medically stable for discharge from the hospital within 2 midnights of admission.   * I certify that at the point of admission it is my clinical judgment that the patient will require inpatient hospital care spanning beyond 2 midnights from the point of admission due to high intensity of service, high risk for further deterioration and high frequency of surveillance required.*  Author: Athena Masse, MD 07/16/2022 8:22 PM  For on call review [email or web address].

## 2022-07-16 NOTE — Assessment & Plan Note (Addendum)
Uncertain etiology, suspecting hypovolemic. Likely related to dehydration from colonoscopy prep over the past two days(per patient report, though information might be unreliable) Will hydrate with NS and monitor Monitor sodium and follow up osmolality and urine sodium

## 2022-07-16 NOTE — Consult Note (Signed)
NEUROLOGY TELECONSULTATION NOTE   Date of service: July 16, 2022 Patient Name: Tamara [name] MRN:  [id number] DOB:  03/13/1946 Reason for consult: telestroke severe dysarthria  Requesting Provider: Dr. Carrie Mew Consult Participants: myself, patient, bedside RN, telestroke RN Location of the provider: [place], [place] Location of the patient: [facility]  This consult was provided via telemedicine with 2-way video and audio communication. The patient/family was informed that care would be provided in this way and agreed to receive care in this manner.   _ _ _   _ __   _ __ _ _  __ __   _ __   __ _  History of Present Illness   This is a 76 yo woman with pmhx anemia, CKD, depression, DM2, HTN, hypothyroidism who was sent from urgent care to [facility] ED after developing acute onset dysarthria at 1810. Patient has severe dysarthria during the exam, at times unintelligible. She lives alone and we are unable to get in touch with her caregiver Rodman Key by phone. She ambulates with a walker at baseline and had a fall last night and hit her head. She went to urgent care, driven by caregiver Rodman Key, 2/2 back pain after the fall. She is unable to tell me if she felt normal last night or this morning. CT head NAICP (personal review). On exam in addition to the dysarthria she has a R facial droop and ataxia in LUE and LLE. No significant weakness although she has some difficulty following commands and had very subtle drift in LUE and RLE. I am concerned that she may have had the ataxia since last night and that may have been why she fell. We called urgent care for further collateral and they were only able to confirm that her change in speech occurred at 1610. Due to this inability to confirm last known well and patient's difficulty communicating 2/2 dysarthria making her unable to confirm inclusion/exclusion criteria patient was not felt to be an appropriate candidate for TNK. CTA showed no LVO, CTP showed tiny  area of possible ischemia R parietal lobe (3cc) that the radiologist and I discussed and are not sure if it is artifactual. Regardless given her acute onset of neurologic deficits, she will be admitted for stroke workup.   All CNS imaging personally reviewed   She is not on anticoagulation, only ASA prior to admission   ROS   UTA 2/2 severe dysarthria  Past History   The following was personally reviewed:  Past Medical History:  Diagnosis Date   Anemia    Chronic kidney disease    Depression    Diabetes mellitus without complication (HCC)    GERD (gastroesophageal reflux disease)    Hypertension    Hypothyroidism    Past Surgical History:  Procedure Laterality Date   ACHILLES TENDON REPAIR     BACK SURGERY     HERNIA REPAIR     SHOULDER SURGERY     Family History  Adopted: Yes  Problem Relation Age of Onset   Cancer Mother    Diabetes Mother    Social History   Socioeconomic History   Marital status: Married    Spouse name: Not on file   Number of children: Not on file   Years of education: Not on file   Highest education level: Not on file  Occupational History   Not on file  Tobacco Use   Smoking status: Never   Smokeless tobacco: Never  Vaping Use   Vaping Use: Never  used  Substance and Sexual Activity   Alcohol use: No   Drug use: No   Sexual activity: Not on file  Other Topics Concern   Not on file  Social History Narrative   Not on file   Social Determinants of Health   Financial Resource Strain: Not on file  Food Insecurity: Not on file  Transportation Needs: Not on file  Physical Activity: Not on file  Stress: Not on file  Social Connections: Not on file   Allergies  Allergen Reactions   Ace Inhibitors Cough    Medications   (Not in a hospital admission)     Current Facility-Administered Medications:    sodium chloride flush (NS) 0.9 % injection 3 mL, 3 mL, Intravenous, Once, Carrie Mew, MD  Current Outpatient  Medications:    acetaminophen (TYLENOL) 325 MG tablet, Take 650 mg by mouth every 6 (six) hours as needed., Disp: , Rfl:    aspirin 81 MG chewable tablet, Chew by mouth daily., Disp: , Rfl:    atorvastatin (LIPITOR) 20 MG tablet, Take 20 mg by mouth at bedtime., Disp: , Rfl:    blood glucose meter kit and supplies KIT, Dispense based on patient and insurance preference. Use up to four times daily as directed., Disp: 1 each, Rfl: 0   BuPROPion HCl ER, XL, 450 MG TB24, Take 450 mg by mouth daily., Disp: , Rfl:    busPIRone (BUSPAR) 10 MG tablet, Take 10 mg by mouth 2 (two) times daily., Disp: , Rfl:    diclofenac Sodium (VOLTAREN) 1 % GEL, Apply 2 g topically 4 (four) times daily., Disp: , Rfl:    DULoxetine (CYMBALTA) 60 MG capsule, Take 60 mg by mouth 2 (two) times daily., Disp: , Rfl:    esomeprazole (NEXIUM) 20 MG capsule, Take 20 mg by mouth daily. To prevent coughing after eating., Disp: , Rfl:    fluticasone (FLONASE) 50 MCG/ACT nasal spray, Place 1 spray into both nostrils 2 (two) times daily., Disp: , Rfl:    insulin glargine (LANTUS) 100 UNIT/ML Solostar Pen, Inject 10 Units into the skin daily., Disp: 15 mL, Rfl: 0   lamoTRIgine (LAMICTAL) 150 MG tablet, Take 150 mg by mouth 2 (two) times daily., Disp: , Rfl:    levothyroxine (SYNTHROID) 112 MCG tablet, Take 112 mcg by mouth daily., Disp: , Rfl:    metFORMIN (GLUCOPHAGE) 500 MG tablet, Take 500 mg by mouth 2 (two) times daily., Disp: , Rfl:    propranolol (INDERAL) 40 MG tablet, Take 0.5 tablets (20 mg total) by mouth 2 (two) times daily., Disp: 30 tablet, Rfl: 0   tiZANidine (ZANAFLEX) 2 MG tablet, Take 0.5 tablets (1 mg total) by mouth at bedtime as needed., Disp: 30 tablet, Rfl: 0  Vitals   Vitals:   07/16/22 1905 07/16/22 1910 07/16/22 1915 07/16/22 1951  BP: (!) 144/121   100/67  Pulse:  84 86 87  Resp:  '18 16 16  ' Temp:    98.2 F (36.8 C)  TempSrc:    Oral  SpO2:  98%  93%     There is no height or weight on file to  calculate BMI.  Physical Exam   Exam performed over telemedicine with 2-way video and audio communication and with assistance of bedside RN  Physical Exam Gen: A&O x3 but not to situation, NAD Resp: normal WOB CV: extremities appear well-perfused  Neuro: *MS: A&O x3 but not to situation, able to follow simple commands but not multi-step *Speech: severe  dysarthria, often unintelligible, able to name 4/6 objects *CN: PERRL 34m, EOMI, blinks to threat bilat, R sensory deficit, R UMN facial droop, hearing intact to voice *Motor:   Normal bulk.  No tremor, rigidity or bradykinesia. Drift but not to bed in LUE and RLE. No drift RUE and LLE. *Sensory: SILT. Symmetric. No double-simultaneous extinction.  *Coordination:  Ataxia on L FNF and L HTS. No ataxia on R. *Reflexes:  UTA 2/2 tele-exam *Gait: deferred  NIHSS  1a Level of Conscious.: 0 1b LOC Questions: 0 1c LOC Commands: 0 2 Best Gaze: 0 3 Visual: 0 4 Facial Palsy: 1 5a Motor Arm - left: 1 5b Motor Arm - Right: 0 6a Motor Leg - Left: 0 6b Motor Leg - Right: 1 7 Limb Ataxia: 2 8 Sensory: 1 9 Best Language: 1 10 Dysarthria: 2 11 Extinct. and Inatten.: 0  TOTAL: 9   Premorbid mRS = at least 3, walks with walker   Labs   CBC:  Recent Labs  Lab 07/16/22 1751 07/16/22 1914  WBC 10.9* 12.2*  NEUTROABS 8.0* 9.6*  HGB 11.6* 11.8*  HCT 35.6* 36.7  MCV 84.4 83.6  PLT 394 3498   Basic Metabolic Panel:  Lab Results  Component Value Date   NA 129 (L) 07/16/2022   K 4.6 07/16/2022   CO2 26 07/16/2022   GLUCOSE 148 (H) 07/16/2022   BUN 22 07/16/2022   CREATININE 0.82 07/16/2022   CALCIUM 9.5 07/16/2022   GFRNONAA >60 07/16/2022   GFRAA 59 (L) 06/03/2018   Lipid Panel:  Lab Results  Component Value Date   LDLCALC 39 05/08/2022   HgbA1c:  Lab Results  Component Value Date   HGBA1C 6.4 (H) 05/08/2022   Urine Drug Screen: No results found for: "LABOPIA", "COCAINSCRNUR", "LABBENZ", "AMPHETMU", "THCU",  "LABBARB"  Alcohol Level     Component Value Date/Time   ETH <10 07/16/2022 1914     Impression   This is a 76yo woman with pmhx anemia, CKD, depression, DM2, HTN, hypothyroidism [facility] onset dys[facility] at 1810. Patient has severe dysarthria during the exam, at times unintelligible. On exam in addition to the dysarthria she has a R facial droop and ataxia in LUE and LLE. No significant weakness although she has some difficulty following commands and had very subtle drift in LUE and RLE. I am concerned that she may have had the ataxia since last night and that may have been why she fell. We were unable to get sufficient collateral information to definitely determine last known well and her speech was too intelligble to determine if she would meet other inclusion/exclusion criteria therefore she was felt to be an inappropriate candidate for thrombolytics. No intervention indicated 2/2 no LVO on exam. Admit for stroke workup.    Recommendations   - Admit for stroke workup - Permissive HTN x48 hrs from sx onset or until stroke ruled out by MRI goal BP <220/110. PRN labetalol or hydralazine if BP above these parameters. Avoid oral antihypertensives. - MRI brain wo contrast - TTE w/ bubble - Check A1c and LDL + add statin per guidelines - ASA 828mdaily + plavix 7518maily x21 days f/b plavix 44m16mily monotherapy after that when able to take po. ASA 300mg34mdaily for now. - q4 hr neuro checks - STAT head CT for any change in neuro exam - Tele - PT/OT/SLP - 2/2 severe dysarthria bedside swallow screen should  not be performed; patient should remain NPO until cleared by SLP. - Stroke education - Amb referral to neurology upon discharge   I will f/u with patient in AM.  ______________________________________________________________________   Thank you for the opportunity to take part in the care of this patient. If you have any  further questions, please contact the neurology consultation attending.  Signed,  Su Monks, MD [facility] [phone]  If 7pm- 7am, please page neurology on call as listed in [place].

## 2022-07-16 NOTE — Assessment & Plan Note (Signed)
Sliding scale insulin coverage 

## 2022-07-16 NOTE — ED Notes (Signed)
This RN notified by  CMA that pt's speech was slurred. Upon assessment, patient was noted to have slurred speech that was different than previous conversations.Pt able to answer questions appropriately. A&O x 4.  MD notified about change in speech and went to further assess patient.

## 2022-07-17 ENCOUNTER — Inpatient Hospital Stay (HOSPITAL_COMMUNITY)
Admit: 2022-07-17 | Discharge: 2022-07-17 | Disposition: A | Discharge status: Home / Self Care | Payer: Medicare Other | Attending: Neurology | Admitting: Neurology

## 2022-07-17 DIAGNOSIS — R471 Dysarthria and anarthria: Secondary | ICD-10-CM | POA: Diagnosis not present

## 2022-07-17 DIAGNOSIS — I6389 Other cerebral infarction: Secondary | ICD-10-CM | POA: Diagnosis not present

## 2022-07-17 DIAGNOSIS — G8929 Other chronic pain: Secondary | ICD-10-CM

## 2022-07-17 LAB — LDL CHOLESTEROL, DIRECT: Direct LDL: 62 mg/dL (ref 0–99)

## 2022-07-17 LAB — HEMOGLOBIN A1C
Hgb A1c MFr Bld: 7.7 % — ABNORMAL HIGH (ref 4.8–5.6)
Mean Plasma Glucose: 174.29 mg/dL

## 2022-07-17 LAB — ECHOCARDIOGRAM COMPLETE
AR max vel: 2.62 cm2
AV Area VTI: 2.86 cm2
AV Area mean vel: 2.41 cm2
AV Mean grad: 3 mmHg
AV Peak grad: 4.7 mmHg
Ao pk vel: 1.08 m/s
Area-P 1/2: 7.02 cm2
Calc EF: 59.3 %
Height: 67 in
S' Lateral: 2.13 cm
Single Plane A2C EF: 59.6 %
Single Plane A4C EF: 57.5 %
Weight: 2564.39 oz

## 2022-07-17 LAB — URINALYSIS, ROUTINE W REFLEX MICROSCOPIC
Bilirubin Urine: NEGATIVE
Glucose, UA: NEGATIVE mg/dL
Hgb urine dipstick: NEGATIVE
Ketones, ur: NEGATIVE mg/dL
Leukocytes,Ua: NEGATIVE
Nitrite: NEGATIVE
Protein, ur: NEGATIVE mg/dL
Specific Gravity, Urine: 1.024 (ref 1.005–1.030)
pH: 6 (ref 5.0–8.0)

## 2022-07-17 LAB — GLUCOSE, CAPILLARY
Glucose-Capillary: 149 mg/dL — ABNORMAL HIGH (ref 70–99)
Glucose-Capillary: 104 mg/dL — ABNORMAL HIGH (ref 70–99)
Glucose-Capillary: 158 mg/dL — ABNORMAL HIGH (ref 70–99)
Glucose-Capillary: 219 mg/dL — ABNORMAL HIGH (ref 70–99)
Glucose-Capillary: 190 mg/dL — ABNORMAL HIGH (ref 70–99)

## 2022-07-17 LAB — LIPID PANEL
Cholesterol: 165 mg/dL (ref 0–200)
HDL: 80 mg/dL (ref >40)
LDL Cholesterol: 58 mg/dL (ref 0–99)
Total CHOL/HDL Ratio: 2.1 RATIO
Triglycerides: 134 mg/dL (ref <150)
VLDL: 27 mg/dL (ref 0–40)

## 2022-07-17 LAB — SODIUM: Sodium: 134 mmol/L — ABNORMAL LOW (ref 135–145)

## 2022-07-17 LAB — OSMOLALITY, URINE: Osmolality, Ur: 249 mOsm/kg — ABNORMAL LOW (ref 300–900)

## 2022-07-17 LAB — SODIUM, URINE, RANDOM: Sodium, Ur: 29 mmol/L

## 2022-07-17 LAB — OSMOLALITY: Osmolality: 284 mOsm/kg (ref 275–295)

## 2022-07-17 MED ORDER — METHOCARBAMOL 500 MG PO TABS
500.0000 mg | ORAL_TABLET | Freq: Once | ORAL | Status: DC
  Filled 2022-07-17: qty 1

## 2022-07-17 MED ORDER — DEXTROSE 5 % IV SOLN
500.0000 mg | Freq: Once | INTRAVENOUS | Status: AC
  Administered 2022-07-17: 500 mg via INTRAVENOUS
  Filled 2022-07-17: qty 500

## 2022-07-17 MED ORDER — METFORMIN HCL 500 MG PO TABS
500.0000 mg | ORAL_TABLET | Freq: Two times a day (BID) | ORAL | Status: DC
  Administered 2022-07-18: 500 mg via ORAL
  Filled 2022-07-17: qty 1

## 2022-07-17 MED ORDER — HYDRALAZINE HCL 20 MG/ML IJ SOLN: 10.0000 mg | INTRAMUSCULAR | Status: DC | PRN

## 2022-07-17 MED ORDER — IRBESARTAN 150 MG PO TABS
75.0000 mg | ORAL_TABLET | Freq: Every day | ORAL | Status: DC
  Administered 2022-07-17 – 2022-07-18 (×2): 75 mg via ORAL
  Filled 2022-07-17 (×2): qty 1

## 2022-07-17 NOTE — Progress Notes (Signed)
*  PRELIMINARY RESULTS* Echocardiogram 2D Echocardiogram has been performed.  Tamara [name] Armina Galloway 07/17/2022, 9:08 AM

## 2022-07-17 NOTE — Progress Notes (Signed)
Eeg done 

## 2022-07-17 NOTE — Assessment & Plan Note (Signed)
Continue Cymbalta and acetaminophen

## 2022-07-17 NOTE — Evaluation (Signed)
Physical Therapy Evaluation Patient Details Name: Tamara [name] MRN: [id number] DOB: 05-17-1946 Today's Date: 07/17/2022  History of Present Illness  Pt is a 76 y.o. female presenting to hospital 8/17 with c/o suspected stroke.  Per ED MD note "Patient had a fall last night in her home where she lives alone.  She went to urgent care today where she started to feel dizzy, vomited once, and then suddenly developed dysarthria."  Pt also noted with R facial droop and ataxia in L UE and L LE.  Pt admitted for stroke workup, hyponatremia, DM, frequent falls, and htn.  MRI of brain negative for acute intracranial abnormality.  PMH includes DM, htn, hypothyroidism, CKD, anemia, achilles tendon repair, back surgery, hernia repair, shoulder surgery, and prior subdural hematoma.  Clinical Impression  Prior to hospital admission, pt was modified independent with ambulation; h/o multiple falls (at least 16 since beginning of this year); lives alone on main level of home with 4 STE L railing (or ramp in back); has caregiver assist 12 hours/day 7 days/week (plus 3-4 times a week at night) currently.  Currently pt is min to mod assist with bed mobility; mod assist with transfers; and min to mod assist to side step to L along bed a couple feet with RW use.  Pt c/o back and B hip pain at rest and pain significantly increased with activities (limiting sessions activities).  Pt c/o B hips cramping in standing (significant pain) but improved once sitting on edge of bed.  Pt appearing confused at times during session.  Overall pt appearing anxious throughout session requiring pacing and encouragement.  Pt would benefit from skilled PT to address noted impairments and functional limitations (see below for any additional details).  Upon hospital discharge, pt would benefit from SNF (if pt declines SNF, recommend 24/7 assist with all functional mobility for safety at home).    Recommendations for follow up therapy are one component  of a multi-disciplinary discharge planning process, led by the attending physician.  Recommendations may be updated based on patient status, additional functional criteria and insurance authorization.  Follow Up Recommendations Skilled nursing-short term rehab (<3 hours/day) Can patient physically be transported by private vehicle: No    Assistance Recommended at Discharge Frequent or constant Supervision/Assistance  Patient can return home with the following  A lot of help with walking and/or transfers;A lot of help with bathing/dressing/bathroom;Assistance with cooking/housework;Assist for transportation;Help with stairs or ramp for entrance;Direct supervision/assist for medications management    Equipment Recommendations Rolling walker (2 wheels)  Recommendations for Other Services  OT consult    Functional Status Assessment Patient has had a recent decline in their functional status and demonstrates the ability to make significant improvements in function in a reasonable and predictable amount of time.     Precautions / Restrictions Precautions Precautions: Fall Restrictions Weight Bearing Restrictions: No      Mobility  Bed Mobility Overal bed mobility: Needs Assistance Bed Mobility: Rolling, Sidelying to Sit, Sit to Sidelying Rolling: Min assist, Mod assist Sidelying to sit: Mod assist     Sit to sidelying: Min assist General bed mobility comments: vc's for logrolling technique; assist for trunk and B LE's (except minimal assist to lay down in bed)    Transfers Overall transfer level: Needs assistance Equipment used: Rolling walker (2 wheels) Transfers: Sit to/from Stand Sit to Stand: Mod assist           General transfer comment: vc's for UE/LE placement; assist to initiate stand up  to RW and control descent sitting    Ambulation/Gait Ambulation/Gait assistance: Min assist, Mod assist Gait Distance (Feet):  (pt able to side step to L a couple feet with RW  use) Assistive device: Rolling walker (2 wheels)   Gait velocity: decreased     General Gait Details: decreased B LE step length/foot clearance; assist to steady  Stairs            Wheelchair Mobility    Modified Rankin (Stroke Patients Only)       Balance Overall balance assessment: Needs assistance Sitting-balance support: Single extremity supported Sitting balance-Leahy Scale: Fair Sitting balance - Comments: steady sitting with at least single UE support   Standing balance support: Bilateral upper extremity supported, During functional activity Standing balance-Leahy Scale: Poor Standing balance comment: assist to steady with RW use                             Pertinent Vitals/Pain Pain Assessment Pain Assessment: Faces Faces Pain Scale: Hurts little more Pain Location: low back; B hips Pain Descriptors / Indicators: Cramping, Tender, Grimacing, Guarding Pain Intervention(s): Limited activity within patient's tolerance, Monitored during session, Premedicated before session, Repositioned BP 154/62 beginning of session at rest.  HR 100-114 bpm during sessions activities.    Home Living Family/patient expects to be discharged to:: Private residence Living Arrangements: Alone Available Help at Discharge: Personal care attendant Type of Home: House Home Access: Stairs to enter Entrance Stairs-Rails: Left Entrance Stairs-Number of Steps: 4 with L railing from garage; has ramp in back   Home Layout: One level;Other (Comment) (storage on 2nd floor (does not have to go to)) Home Equipment: Grab bars - tub/shower;Rollator (4 wheels);Shower seat;Wheelchair - manual (3ww)      Prior Function Prior Level of Function : Needs assist             Mobility Comments: Receives OP PT 2-3 times/week.  Uses rollator in community (pt not clearly verbalizing if she uses walker in home).  Pt with at least 16 falls since January 1st 2023. ADLs Comments: Has  caregivers 12 hrs/day (7 days a week) currently and 3-4x/week has overnight caregivers.  Per OT eval "reports they primarily assist with cooking/cleaning/supervision; she mostly performs ADLs w/o physical assistance".     Hand Dominance   Dominant Hand: Right    Extremity/Trunk Assessment   Upper Extremity Assessment Upper Extremity Assessment: Generalized weakness    Lower Extremity Assessment Lower Extremity Assessment: Generalized weakness (pt had difficulty following directions for R LE proprioception activities (seemed impaired R great toe and R ankle but unsure of accuracy))    Cervical / Trunk Assessment Cervical / Trunk Assessment: Other exceptions Cervical / Trunk Exceptions: forward head/shoulders  Communication   Communication: Expressive difficulties (mild word finding difficulties (noticed more when pt appearing anxious))  Cognition Arousal/Alertness: Awake/alert Behavior During Therapy: Anxious Overall Cognitive Status: History of cognitive impairments - at baseline                                 General Comments: Oriented to person and place.  Pt reported it was September 18th 2023.  Pt had difficulty describing why she was in hospital.        General Comments  Nursing cleared pt for participation in physical therapy.  Pt agreeable to PT session.  Pt's caregiver Molli Hazard present during session.  Exercises     Assessment/Plan    PT Assessment Patient needs continued PT services  PT Problem List Decreased strength;Decreased activity tolerance;Decreased balance;Decreased mobility;Decreased knowledge of use of DME;Decreased knowledge of precautions;Pain       PT Treatment Interventions DME instruction;Gait training;Functional mobility training;Therapeutic activities;Therapeutic exercise;Balance training;Patient/family education    PT Goals (Current goals can be found in the Care Plan section)  Acute Rehab PT Goals Patient Stated Goal: to  improve pain and mobility PT Goal Formulation: With patient Time For Goal Achievement: 07/31/22 Potential to Achieve Goals: Fair    Frequency Min 2X/week     Co-evaluation               AM-PAC PT "6 Clicks" Mobility  Outcome Measure Help needed turning from your back to your side while in a flat bed without using bedrails?: A Lot Help needed moving from lying on your back to sitting on the side of a flat bed without using bedrails?: A Lot Help needed moving to and from a bed to a chair (including a wheelchair)?: A Lot Help needed standing up from a chair using your arms (e.g., wheelchair or bedside chair)?: A Lot Help needed to walk in hospital room?: Total Help needed climbing 3-5 steps with a railing? : Total 6 Click Score: 10    End of Session Equipment Utilized During Treatment: Gait belt Activity Tolerance: Patient limited by pain Patient left: in bed (with transport present to take pt for testing) Nurse Communication: Mobility status;Precautions;Other (comment) (pt's pain status and BP) PT Visit Diagnosis: Unsteadiness on feet (R26.81);Other abnormalities of gait and mobility (R26.89);Repeated falls (R29.6);Muscle weakness (generalized) (M62.81);History of falling (Z91.81);Pain Pain - Right/Left: Left (R) Pain - part of body: Hip    Time: 1355-1425 PT Time Calculation (min) (ACUTE ONLY): 30 min   Charges:   PT Evaluation $PT Eval Low Complexity: 1 Low PT Treatments $Therapeutic Activity: 8-22 mins       Hendricks Limes, PT 07/17/22, 3:19 PM

## 2022-07-17 NOTE — Evaluation (Addendum)
Occupational Therapy Evaluation Patient Details Name: Tamara [name] MRN: [id number] DOB: 09/15/1946 Today's Date: 07/17/2022   History of Present Illness Pt is a 76 y.o. female presenting to hospital 8/17 with c/o suspected stroke.  Per ED MD note "Patient had a fall last night in her home where she lives alone.  She went to urgent care today where she started to feel dizzy, vomited once, and then suddenly developed dysarthria."  Pt also noted with R facial droop and ataxia in L UE and L LE.  Pt admitted for stroke workup, hyponatremia, DM, frequent falls, and htn.  MRI of brain negative for acute intracranial abnormality.  PMH includes DM, htn, hypothyroidism, CKD, anemia, achilles tendon repair, back surgery, hernia repair, shoulder surgery, and prior subdural hematoma.   Clinical Impression   Ms. Iannaccone presents with generalized weakness, limited endurance, mild confusion, and severe lower back pain. She lives in a single-story home, 4 STE, and has caregivers present 12 hrs/day, 7 days/week. She uses a RW for ambulation in the home, a rollator in the community, but experiences frequent falls, reporting 15-16 this year. During today's evaluation, she is oriented to time and place but has occasional word-finding difficulties and appears somewhat confused about situation: she is aware she is in the hospital after having a fall at home but also states that she thinks she will soon be going for a colonoscopy. Her speech is clear and intelligible. UE strength and ROM grossly symmetrical bilaterally. She endorses 10/10 lower back pain, which comes in spasms and is exacerbated by movement. She requires Mod A with bed mobility and transfers and demonstrates poor standing balance, heavily reliant on UE support for steadying. She needs Min A for upper-body dressing, 2/2 increasing back spasms when she raises her UE. Provided educ re: falls prevention, cognitive re-orientation, non-pharmacological pain mgmt  strategies. Pt's geriatric case manager is present in room, states that he has arranged for 24/7 caregivers at home following pt's return, pt is agreeable to this. Recommend ongoing OT while hospitalized, with DC home with 24-hr SUPV and HHOT.   Recommendations for follow up therapy are one component of a multi-disciplinary discharge planning process, led by the attending physician.  Recommendations may be updated based on patient status, additional functional criteria and insurance authorization.   Follow Up Recommendations  Home health OT    Assistance Recommended at Discharge Frequent or constant Supervision/Assistance  Patient can return home with the following A little help with walking and/or transfers;A little help with bathing/dressing/bathroom;Assistance with cooking/housework;Direct supervision/assist for medications management;Assist for transportation    Functional Status Assessment  Patient has had a recent decline in their functional status and demonstrates the ability to make significant improvements in function in a reasonable and predictable amount of time.  Equipment Recommendations  None recommended by OT    Recommendations for Other Services       Precautions / Restrictions Precautions Precautions: Fall Restrictions Weight Bearing Restrictions: No      Mobility Bed Mobility Overal bed mobility: Needs Assistance Bed Mobility: Supine to Sit, Sit to Supine     Supine to sit: Mod assist Sit to supine: Min assist        Transfers Overall transfer level: Needs assistance Equipment used: Rolling walker (2 wheels) Transfers: Sit to/from Stand Sit to Stand: Mod assist           General transfer comment: Mod A, 2/2 pain with movement      Balance Overall balance assessment: Needs assistance Sitting-balance support:  Bilateral upper extremity supported Sitting balance-Leahy Scale: Fair     Standing balance support: Bilateral upper extremity supported,  During functional activity Standing balance-Leahy Scale: Poor                             ADL either performed or assessed with clinical judgement   ADL Overall ADL's : Needs assistance/impaired     Grooming: Wash/dry hands;Wash/dry face;Sitting;Minimal assistance Grooming Details (indicate cue type and reason): requires assistance 2/2 pain         Upper Body Dressing : Minimal assistance Upper Body Dressing Details (indicate cue type and reason): changing hospital gown Lower Body Dressing: Maximal assistance Lower Body Dressing Details (indicate cue type and reason): requires Max A at baseline; uses slip-on shoes and sock aide at home                     Vision         Perception     Praxis      Pertinent Vitals/Pain Pain Assessment Pain Assessment: 0-10 Pain Score: 10-Worst pain ever Pain Intervention(s): Repositioned, Limited activity within patient's tolerance, Utilized relaxation techniques, Patient requesting pain meds-RN notified     Hand Dominance Right   Extremity/Trunk Assessment Upper Extremity Assessment Upper Extremity Assessment: Generalized weakness   Lower Extremity Assessment Lower Extremity Assessment: Generalized weakness       Communication Communication Communication: Expressive difficulties (mild word-finding difficulties)   Cognition Arousal/Alertness: Awake/alert Behavior During Therapy: WFL for tasks assessed/performed Overall Cognitive Status: Within Functional Limits for tasks assessed                                 General Comments: oriented to time, place, situation; some word-finding difficulties     General Comments       Exercises Other Exercises Other Exercises: Educ re: falls prevention, safe DME use, non-pharmacological pain mgmt strategies   Shoulder Instructions      Home Living Family/patient expects to be discharged to:: Private residence Living Arrangements: Alone Available  Help at Discharge: Personal care attendant Type of Home: House Home Access: Stairs to enter [employer] of Steps: 4 Entrance Stairs-Rails: Left Home Layout: One level     Bathroom Shower/Tub: Producer, television/film/video: Handicapped height     Home Equipment: Grab bars - tub/shower;Rollator (4 wheels);Shower seat;Rolling Environmental consultant (2 wheels)      Lives With: Alone (has paid caregivers)    Prior Functioning/Environment Prior Level of Function : Needs assist             Mobility Comments: Uses RW in house, rollator in community. Reports "15 or 16" falls this year. ADLs Comments: Has caregivers 12 hrs/day, reports they primarily assist with cooking/cleaning/supervision; she mostly performs ADLs w/o physical assistance        OT Problem List: Decreased strength;Decreased range of motion;Decreased activity tolerance;Pain;Decreased cognition;Decreased knowledge of use of DME or AE      OT Treatment/Interventions: Self-care/ADL training;Therapeutic exercise;Patient/family education;Balance training;Energy conservation;Therapeutic activities;DME and/or AE instruction    OT Goals(Current goals can be found in the care plan section) Acute Rehab OT Goals Patient Stated Goal: to not be in pain OT Goal Formulation: With patient Time For Goal Achievement: 07/31/22 Potential to Achieve Goals: Good ADL Goals Pt Will Transfer to Toilet: with supervision;ambulating (using LRAD) Pt Will Perform Tub/Shower Transfer: with supervision;ambulating;shower seat;rolling walker  Additional ADL Goal #1: Pt will identify/demonstrate 2+ falls prevention techniques  OT Frequency: Min 2X/week    Co-evaluation              AM-PAC OT "6 Clicks" Daily Activity     Outcome Measure Help from another person eating meals?: None Help from another person taking care of personal grooming?: A Little Help from another person toileting, which includes using toliet, bedpan, or urinal?: A  Lot Help from another person bathing (including washing, rinsing, drying)?: A Lot Help from another person to put on and taking off regular upper body clothing?: A Little Help from another person to put on and taking off regular lower body clothing?: A Lot 6 Click Score: 16   End of Session Equipment Utilized During Treatment: Rolling walker (2 wheels) Nurse Communication: Mobility status;Patient requests pain meds  Activity Tolerance: Patient limited by pain Patient left: in bed;with family/visitor present;with call bell/phone within reach;with chair alarm set  OT Visit Diagnosis: Unsteadiness on feet (R26.81);Pain;Cognitive communication deficit (R41.841);Muscle weakness (generalized) (M62.81);Repeated falls (R29.6)                Time: 4315-4008 OT Time Calculation (min): 26 min Charges:  OT General Charges $OT Visit: 1 Visit OT Evaluation $OT Eval Moderate Complexity: 1 Mod OT Treatments $Self Care/Home Management : 23-37 mins Latina Craver, PhD, MS, OTR/L 07/17/22, 1:23 PM

## 2022-07-17 NOTE — Progress Notes (Signed)
PROGRESS NOTE    Tamara [name]  [id number] DOB: 09-Oct-1946 DOA: 07/16/2022 PCP: [facility]     Brief Narrative:   From admission h and p Tamara [name] is a 76 y.o. female with medical history significant for Insulin-dependent type 2 diabetes, depression and anxiety on lamictal, prior subdural hematuria, memory impairment, hospitalized in June 2023 with recurrent hypoglycemia related to medication, , CKD,  back surgeries with chronic pain, ambulatory dysfunction and ffalls, ambulant at baseline with a walker, who presents to the ED from urgent care as a code stroke after presenting with dysarthria following a fall the night prior in which she hit her head.  She initially complained of back pain but on arrival to urgent care she mentioned that she had been having difficulty getting her words out and she was noted at times to be intelligible.  Speech change occurred at 1610 on 8/17.  The circumstances of the fall was difficult to elucidate.  Patient said who foods were getting tangled up because her mouth was very dry.  She said she was on a diet and did a colonoscopy prep, however chart review does not reveal an encounter for colonoscopy ED course and data review: BP on arrival was 144/121, improving on its own to 100/67 with otherwise normal vitals.  Labs significant for hyponatremia of 129, mild leukocytosis of 12,000 and hemoglobin 11.8, otherwise unremarkable.  EKG, personally viewed and interpreted showed sinus at 86 with no acute ST-T wave changes. CT head code stroke protocol showed no acute abnormalities and CTA head and neck showed no emergent LVO or high-grade stenosis.  It did show at 3 mL perfusion deficit in the right temporal lobe of questionable accuracy an MRI was recommended. Neurology consult: Patient was seen and evaluated by neurologist, Dr. Quinn Axe who did not feel patient to be an appropriate TNK candidate in spite of new onset of neurologic deficits due to  inability to determine last known well, no LVO on CTA, and indeterminate area on CT PE.  Hospitalist consulted for admission.  NIHSS at the time of neurology evaluation was 9.    Assessment & Plan:   Principal Problem:   Acute CVA (cerebrovascular accident) [employer]) Active Problems:   Hyponatremia   Frequent falls   Diabetes mellitus ([place])   Essential hypertension   Acquired hypothyroidism   Sleep apnea   Depression   Chronic back pain  # Dysarthria Initial concern for acute stroke given dysarthria, facial droop. However MRI is negative, no high-grade lesion seen on CTA. Patient is followed by [facility] neurology for memory loss and other cognitive issues. 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 This morning her dysarthria and facial droop have resolved. Per her geriatric case manager Neena Rhymes [phone]), has had episodes like this in the past. She was recently treated with oral corticosteroids and opioids for low back pain. Neurology is following - EEG ordered - PT/OT/SLP consults  # Hyponatremia Baseline appears to be around 135. Was 129 on arrival, has spontaneously improved to 134 this morning - monitor  # T2DM Here glucose appropriate - continue home metformin  # Hypothyroid - home levothyroxine  # GAD - home wellbutrin, buspirone, lamictal  # HTN  Here bp elevated, home ARB held 2/2 concern for CVA - resume home ARB (substitute formulary equivalent)  # Acute on chronic low back pain  Saw [facility] ortho last week, plain films stable from priors, has history lumbar surgery. Plan is f/u there in a week - home duloxetine       DVT prophylaxis: lovenox Code Status: full Family Communication: case manager updated @ bedside  Level of care: Telemetry Medical Status is: Inpatient Remains inpatient  appropriate because: need for ongoing inpatient w/u    Consultants:  neurology  Procedures: none  Antimicrobials:  none    Subjective: This morning feels well other than low back pain  Objective: Vitals:   07/17/22 0025 07/17/22 0507 07/17/22 0816 07/17/22 1205  BP: (!) 137/93 (!) 177/82 (!) 181/147 (!) 188/80  Pulse: 82 87 88 96  Resp: 17 17 18 15   Temp: 98 F (36.7 C) 97.6 F (36.4 C) 97.6 F (36.4 C) 97.9 F (36.6 C)  TempSrc:      SpO2: 91% 96% 97% 95%  Weight:      Height:        Intake/Output Summary (Last 24 hours) at 07/17/2022 1424 Last data filed at 07/17/2022 1300 Gross per 24 hour  Intake 1197.77 ml  Output 800 ml  Net 397.77 ml   Filed Weights   07/16/22 2206  Weight: 72.7 kg    Examination:  General exam: Appears calm and comfortable  Respiratory system: Clear to auscultation. Respiratory effort normal. Cardiovascular system: S1 & S2 heard, RRR. No JVD, murmurs, rubs, gallops or clicks. No pedal edema. Gastrointestinal system: Abdomen is nondistended, soft and nontender. No organomegaly or masses felt. Normal bowel sounds heard. Central nervous system: Alert and oriented. No focal neurological deficits. Extremities: Symmetric 5 x 5 power. Skin: No rashes, lesions or ulcers Psychiatry: confused    Data Reviewed: I have personally reviewed following labs and imaging studies  CBC: Recent Labs  Lab 07/16/22 1751 07/16/22 1914  WBC 10.9* 12.2*  NEUTROABS 8.0* 9.6*  HGB 11.6* 11.8*  HCT 35.6* 36.7  MCV 84.4 83.6  PLT 394 [id number]   Basic Metabolic Panel: Recent Labs  Lab 07/16/22 1751 07/16/22 1914 07/17/22 0133  NA 129* 129* 134*  K 4.3 4.6  --   CL 92* 91*  --   CO2 27 26  --   GLUCOSE 155* 148*  --   BUN 22 22  --   CREATININE 0.88 0.82  --   CALCIUM 9.3 9.5  --    GFR: Estimated Creatinine Clearance: 57.6 mL/min (by C-G formula based on SCr of 0.82 mg/dL). Liver Function Tests: Recent Labs  Lab 07/16/22 1751  07/16/22 1914  AST 22 33  ALT 19 18  ALKPHOS 86 83  BILITOT <0.1* 1.0  PROT 7.3 7.5  ALBUMIN 4.0 4.1   Recent Labs  Lab 07/16/22 1751  LIPASE 30   No results for input(s): "AMMONIA" in the last 168 hours. Coagulation Profile: Recent Labs  Lab 07/16/22 1914  INR 1.1   Cardiac Enzymes: No results for input(s): "CKTOTAL", "CKMB", "CKMBINDEX", "TROPONINI" in the last 168 hours. BNP (last 3 results) No results for input(s): "PROBNP" in the last 8760 hours. HbA1C: Recent Labs    07/16/22 1914  HGBA1C 7.7*   CBG: Recent Labs  Lab 07/16/22 1729 07/16/22 1838 07/16/22 2209 07/17/22 0816 07/17/22 1146  GLUCAP 148* 149* 114* 104* 158*   Lipid Profile: Recent Labs    07/16/22 1914 07/17/22 0133  CHOL  --  165  HDL  --  80  LDLCALC  --  58  TRIG  --  134  CHOLHDL  --  2.1  LDLDIRECT 62  --    Thyroid Function Tests: No results for input(s): "TSH", "T4TOTAL", "FREET4", "T3FREE", "THYROIDAB" in the last 72 hours. Anemia Panel: No results for input(s): "VITAMINB12", "FOLATE", "FERRITIN", "TIBC", "IRON", "RETICCTPCT" in the last 72 hours. Urine analysis:    Component Value Date/Time   COLORURINE STRAW (A) 07/17/2022 0156   APPEARANCEUR CLEAR (A) 07/17/2022 0156   LABSPEC 1.024 07/17/2022 0156   PHURINE 6.0 07/17/2022 0156   GLUCOSEU NEGATIVE 07/17/2022 0156   HGBUR NEGATIVE 07/17/2022 0156   BILIRUBINUR NEGATIVE 07/17/2022 0156   KETONESUR NEGATIVE 07/17/2022 0156   PROTEINUR NEGATIVE 07/17/2022 0156   NITRITE NEGATIVE 07/17/2022 0156   LEUKOCYTESUR NEGATIVE 07/17/2022 0156   Sepsis Labs: @LABRCNTIP (procalcitonin:4,lacticidven:4)  )No results found for this or any previous visit (from the past 240 hour(s)).       Radiology Studies: ECHOCARDIOGRAM COMPLETE  Result Date: 07/17/2022    ECHOCARDIOGRAM REPORT   Patient Name:   UCHENNA GOURD Date of Exam: 07/17/2022 Medical Rec #:  [id number]     Height:       67.0 in Accession #:    [id number]    Weight:        160.3 lb Date of Birth:  Aug 12, 1946     BSA:          1.841 m Patient Age:    4 years      BP:           177/82 mmHg Patient Gender: F             HR:           87 bpm. Exam Location:  [facility] Procedure: 2D Echo, Color Doppler and Cardiac Doppler Indications:     I63.9 Stroke  History:         Patient has no prior history of Echocardiogram examinations.                  CKD; Risk Factors:Hypertension and Diabetes.  Sonographer:     Charmayne Sheer Referring Phys:  [place] Diagnosing Phys: Ida Rogue MD  Sonographer Comments: Technically difficult study due to poor echo windows. Image acquisition challenging due to uncooperative patient. IMPRESSIONS  1. Left ventricular ejection fraction, by estimation, is 60 to 65%. The left ventricle has normal function. The left ventricle has no regional wall motion abnormalities. Left ventricular diastolic parameters are consistent with Grade I diastolic dysfunction (impaired relaxation).  2. Right ventricular systolic function is normal. The right ventricular size is normal. Tricuspid regurgitation signal is inadequate for assessing PA pressure.  3. The mitral valve is normal in structure. Mild mitral valve regurgitation. No evidence of mitral stenosis.  4. The aortic valve was not well visualized. Aortic valve regurgitation is not visualized. No aortic stenosis is present.  5. The inferior vena cava is normal in size with greater than 50% respiratory variability, suggesting right atrial pressure of 3 mmHg. FINDINGS  Left Ventricle: Left ventricular ejection fraction, by estimation, is 60 to 65%. The left ventricle has normal function. The left ventricle has no regional wall motion abnormalities. The left ventricular internal cavity size was normal in size. There is  no left ventricular hypertrophy. Left ventricular diastolic parameters are consistent with Grade I diastolic dysfunction (impaired relaxation). Right Ventricle: The right ventricular size is normal.  No increase in right ventricular wall thickness. Right ventricular systolic function is normal. Tricuspid regurgitation signal is inadequate for assessing PA pressure. Left Atrium: Left atrial size was normal  in size. Right Atrium: Right atrial size was normal in size. Pericardium: There is no evidence of pericardial effusion. Mitral Valve: The mitral valve is normal in structure. Mild mitral valve regurgitation. No evidence of mitral valve stenosis. Tricuspid Valve: The tricuspid valve is normal in structure. Tricuspid valve regurgitation is not demonstrated. No evidence of tricuspid stenosis. Aortic Valve: The aortic valve was not well visualized. Aortic valve regurgitation is not visualized. No aortic stenosis is present. Aortic valve mean gradient measures 3.0 mmHg. Aortic valve peak gradient measures 4.7 mmHg. Aortic valve area, by VTI measures 2.86 cm. Pulmonic Valve: The pulmonic valve was normal in structure. Pulmonic valve regurgitation is not visualized. No evidence of pulmonic stenosis. Aorta: The aortic root is normal in size and structure. Venous: The inferior vena cava is normal in size with greater than 50% respiratory variability, suggesting right atrial pressure of 3 mmHg. IAS/Shunts: No atrial level shunt detected by color flow Doppler.  LEFT VENTRICLE PLAX 2D LVIDd:         3.48 cm     Diastology LVIDs:         2.13 cm     LV e' medial:    5.66 cm/s LV PW:         1.14 cm     LV E/e' medial:  11.6 LV IVS:        0.88 cm     LV e' lateral:   7.18 cm/s LVOT diam:     2.00 cm     LV E/e' lateral: 9.1 LV SV:         48 LV SV Index:   26 LVOT Area:     3.14 cm  LV Volumes (MOD) LV vol d, MOD A2C: 49.5 ml LV vol d, MOD A4C: 67.5 ml LV vol s, MOD A2C: 20.0 ml LV vol s, MOD A4C: 28.7 ml LV SV MOD A2C:     29.5 ml LV SV MOD A4C:     67.5 ml LV SV MOD BP:      35.4 ml RIGHT VENTRICLE RV Basal diam:  3.01 cm RV Mid diam:    3.30 cm LEFT ATRIUM             Index        RIGHT ATRIUM          Index LA diam:         3.70 cm 2.01 cm/m   RA Area:     8.36 cm LA Vol (A2C):   43.6 ml 23.69 ml/m  RA Volume:   15.30 ml 8.31 ml/m LA Vol (A4C):   31.7 ml 17.22 ml/m LA Biplane Vol: 37.3 ml 20.26 ml/m  AORTIC VALVE                    PULMONIC VALVE AV Area (Vmax):    2.62 cm     PV Vmax:       0.81 m/s AV Area (Vmean):   2.41 cm     PV Peak grad:  2.7 mmHg AV Area (VTI):     2.86 cm AV Vmax:           108.00 cm/s AV Vmean:          75.300 cm/s AV VTI:            0.169 m AV Peak Grad:      4.7 mmHg AV Mean Grad:      3.0 mmHg LVOT Vmax:  90.10 cm/s LVOT Vmean:        57.700 cm/s LVOT VTI:          0.154 m LVOT/AV VTI ratio: 0.91  AORTA Ao Root diam: 2.90 cm MITRAL VALVE MV Area (PHT): 7.02 cm    SHUNTS MV Decel Time: 108 msec    Systemic VTI:  0.15 m MV E velocity: 65.60 cm/s  Systemic Diam: 2.00 cm MV A velocity: 89.10 cm/s MV E/A ratio:  0.74 Julien Nordmann MD Electronically signed by Julien Nordmann MD Signature Date/Time: 07/17/2022/10:51:31 AM    Final    MR BRAIN WO CONTRAST  Result Date: 07/16/2022 CLINICAL DATA:  Right facial droop EXAM: MRI HEAD WITHOUT CONTRAST TECHNIQUE: Multiplanar, multiecho pulse sequences of the brain and surrounding structures were obtained without intravenous contrast. COMPARISON:  None Available. FINDINGS: Brain: No acute infarct, mass effect or extra-axial collection. No acute or chronic hemorrhage. Normal white matter signal. Generalized volume loss. The midline structures are normal. Vascular: Major flow voids are preserved. Skull and upper cervical spine: Normal calvarium and skull base. Visualized upper cervical spine and soft tissues are normal. Sinuses/Orbits:No paranasal sinus fluid levels or advanced mucosal thickening. No mastoid or middle ear effusion. Normal orbits. IMPRESSION: 1. No acute intracranial abnormality. 2. Generalized volume loss. Electronically Signed   By: Deatra Robinson M.D.   On: 07/16/2022 22:07   DG Pelvis 1-2 Views  Result Date:  07/16/2022 CLINICAL DATA:  Left hip pain EXAM: PELVIS - 1-2 VIEW COMPARISON:  Radiographs 06/05/2021 FINDINGS: No fracture or dislocation. Degenerative changes pubic symphysis, both hips, SI joints and lower lumbar spine. Lumbar spine fusion hardware. Enteric contrast within the bladder from CT earlier today. IMPRESSION: No acute fracture or dislocation. Moderate bilateral hip degenerative arthritis. Electronically Signed   By: Minerva Fester M.D.   On: 07/16/2022 20:18   CT ANGIO HEAD NECK W WO CM W PERF (CODE STROKE)  Result Date: 07/16/2022 CLINICAL DATA:  Slurred speech and right facial droop EXAM: CT ANGIOGRAPHY HEAD AND NECK CT PERFUSION BRAIN TECHNIQUE: Multidetector CT imaging of the head and neck was performed using the standard protocol during bolus administration of intravenous contrast. Multiplanar CT image reconstructions and MIPs were obtained to evaluate the vascular anatomy. Carotid stenosis measurements (when applicable) are obtained utilizing NASCET criteria, using the distal internal carotid diameter as the denominator. Multiphase CT imaging of the brain was performed following IV bolus contrast injection. Subsequent parametric perfusion maps were calculated using RAPID software. RADIATION DOSE REDUCTION: This exam was performed according to the departmental dose-optimization program which includes automated exposure control, adjustment of the mA and/or kV according to patient size and/or use of iterative reconstruction technique. CONTRAST:  OMNIPAQUE IOHEXOL 350 MG/ML SOLN COMPARISON:  None Available. FINDINGS: CTA NECK FINDINGS SKELETON: There is no bony spinal canal stenosis. No lytic or blastic lesion. OTHER NECK: Normal pharynx, larynx and major salivary glands. No cervical lymphadenopathy. Unremarkable thyroid gland. UPPER CHEST: No pneumothorax or pleural effusion. No nodules or masses. AORTIC ARCH: There is no calcific atherosclerosis of the aortic arch. There is no aneurysm,  dissection or hemodynamically significant stenosis of the visualized portion of the aorta. Normal variant aortic arch branching pattern with the left vertebral artery arising independently from the aortic arch. The visualized proximal subclavian arteries are widely patent. RIGHT CAROTID SYSTEM: Normal without aneurysm, dissection or stenosis. LEFT CAROTID SYSTEM: Normal without aneurysm, dissection or stenosis. VERTEBRAL ARTERIES: Left dominant configuration. Both origins are clearly patent. There is no dissection, occlusion or  flow-limiting stenosis to the skull base (V1-V3 segments). CTA HEAD FINDINGS POSTERIOR CIRCULATION: --Vertebral arteries: Normal V4 segments. --Inferior cerebellar arteries: Normal. --Basilar artery: Normal. --Superior cerebellar arteries: Normal. --Posterior cerebral arteries (PCA): Normal. ANTERIOR CIRCULATION: --Intracranial internal carotid arteries: Normal. --Anterior cerebral arteries (ACA): Normal. Absent right A1 segment, normal variant --Middle cerebral arteries (MCA): Normal. VENOUS SINUSES: As permitted by contrast timing, patent. ANATOMIC VARIANTS: None Review of the MIP images confirms the above findings. CT Brain Perfusion Findings: ASPECTS: 10 CBF (<30%) Volume: 50mL Perfusion (Tmax>6.0s) volume: 52mL Mismatch Volume: 67mL Infarction Location:No core infarct. The perfusion deficit is located in the right temporal lobe but is of questionable accuracy. MRI would provide a more definitive answer. IMPRESSION: 1. No emergent large vessel occlusion or high-grade stenosis of the intracranial arteries. 2. No core infarct. 3 mL perfusion deficit is located in the right temporal lobe but is of questionable accuracy. MRI would provide a more definitive answer. Electronically Signed   By: Deatra Robinson M.D.   On: 07/16/2022 19:53   CT HEAD CODE STROKE WO CONTRAST  Result Date: 07/16/2022 CLINICAL DATA:  Code stroke.  Acute neuro deficit.  Slurred speech EXAM: CT HEAD WITHOUT CONTRAST  TECHNIQUE: Contiguous axial images were obtained from the base of the skull through the vertex without intravenous contrast. RADIATION DOSE REDUCTION: This exam was performed according to the departmental dose-optimization program which includes automated exposure control, adjustment of the mA and/or kV according to patient size and/or use of iterative reconstruction technique. COMPARISON:  CT head 05/08/2022 FINDINGS: Brain: Mild atrophy. Mild white matter hypodensity bilaterally appears chronic. Negative for acute infarct, hemorrhage, mass. Vascular: Negative for hyperdense vessel Skull: Negative Sinuses/Orbits: Paranasal sinuses clear.  Negative orbit Other: None ASPECTS ([place] Stroke Program Early CT Score) - Ganglionic level infarction (caudate, lentiform nuclei, internal capsule, insula, M1-M3 cortex): 7 - Supraganglionic infarction (M4-M6 cortex): 3 Total score (0-10 with 10 being normal): 10 IMPRESSION: 1. No acute intracranial abnormality 2. ASPECTS is 10 3. Mild atrophy and mild chronic microvascular ischemic change in the white matter. 4. These results were called by telephone at the time of interpretation on 07/16/2022 at 6:50 pm to provider PHILLIP STAFFORD , who verbally acknowledged these results. Electronically Signed   By: Marlan Palau M.D.   On: 07/16/2022 18:51        Scheduled Meds:   stroke: early stages of recovery book   Does not apply Once   aspirin  81 mg Oral Daily   atorvastatin  20 mg Oral QHS   buPROPion  450 mg Oral Daily   busPIRone  10 mg Oral BID   clopidogrel  75 mg Oral Daily   DULoxetine  60 mg Oral BID   enoxaparin (LOVENOX) injection  40 mg Subcutaneous Q24H   insulin aspart  0-15 Units Subcutaneous TID WC   insulin aspart  0-5 Units Subcutaneous QHS   insulin glargine-yfgn  10 Units Subcutaneous Daily   irbesartan  75 mg Oral Daily   lamoTRIgine  150 mg Oral BID   levothyroxine  112 mcg Oral Q0600   pantoprazole  40 mg Oral Daily   sodium chloride flush   3 mL Intravenous Once   Continuous Infusions:   LOS: 1 day     Silvano Bilis, MD [facility]   If 7PM-7AM, please contact night-coverage [email or web address] Password [facility] 07/17/2022, 2:24 PM

## 2022-07-17 NOTE — Evaluation (Signed)
Speech Language Pathology Evaluation Patient Details Name: Tamara [name] MRN: [id number] DOB: [birth year] Today's Date: 07/17/2022 Time: 1140-1155 SLP Time Calculation (min) (ACUTE ONLY): 15 min  Problem List:  Patient Active Problem List   Diagnosis Date Noted   Chronic back pain 07/17/2022   Acute CVA (cerebrovascular accident) (HCC) 07/16/2022   Hyponatremia 07/16/2022   AKI (acute kidney injury) (HCC) 05/08/2022   Anemia 05/08/2022   Chronic kidney disease 05/08/2022   Depression 05/08/2022   Generalized weakness 05/08/2022   Hyperglycemia 05/08/2022   Hypoglycemia 05/08/2022   Cervical spondylosis with myelopathy 07/11/2021   Anxiety, mild 05/16/2021   Loss of memory 05/16/2021   Body mass index (BMI) 31.0-31.9, adult 04/11/2021   Hx of transient ischemic attack (TIA) 04/08/2021   Mild cognitive impairment of uncertain or unknown etiology 04/07/2021   Other spondylosis with myelopathy, thoracic region 04/07/2021   Dysarthria 03/31/2021   Frequent falls 03/30/2021   Gastroesophageal reflux disease 11/30/2020   Diabetes mellitus (HCC) 11/30/2020   Long term (current) use of insulin (HCC) 11/30/2020   Long term (current) use of oral hypoglycemic drugs 11/30/2020   Obesity, unspecified 11/30/2020   Other long term (current) drug therapy 11/30/2020   Unspecified osteoarthritis, unspecified site 11/30/2020   Osteoarthritis 11/30/2020   Polycystic ovarian syndrome 11/30/2020   Pure hypercholesterolemia, unspecified 11/30/2020   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 11/30/2020   Depression, unspecified 11/30/2020   Risk for falls 08/30/2019   Essential hypertension 06/14/2019   Chronic thumb pain, left 12/02/2018   Type 2 diabetes mellitus with diabetic chronic kidney disease (HCC) 12/02/2018   Hypertension associated with type 2 diabetes mellitus (HCC) 12/02/2018   Hyperlipidemia associated with type 2 diabetes mellitus (HCC) 12/02/2018   Chronic  kidney disease, stage 3 unspecified (HCC) 09/30/2017   Urinary incontinence in female 08/16/2017   Diabetic neuropathy (HCC) 06/10/2017   Essential tremor 06/10/2017   Fusion of spine of thoracic region 02/15/2017   Sleep apnea 02/05/2017   History of MRSA infection 02/05/2017   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   Status post lumbar spinal fusion 02/05/2017   Mixed action and resting tremor 09/24/2016   Unsteady gait 09/24/2016   Orthostatic hypotension 03/09/2016   History of cervical spinal surgery 03/08/2016   Abnormal cardiovascular stress test 02/20/2016   Spinal stenosis of lumbar region without neurogenic claudication 01/31/2014   Varicose veins of both lower extremities 02/17/2013   GERD (gastroesophageal reflux disease) 11/04/2012   Obesity 10/11/2012   Mixed hyperlipidemia 06/11/2011   Acquired hypothyroidism 06/11/2011   History of colonic polyps 06/11/2011   History of polycystic ovarian syndrome 06/11/2011   Rotator cuff tear 06/11/2011   Primary hypothyroidism 06/11/2011   Past Medical History:  Past Medical History:  Diagnosis Date   Anemia    Chronic kidney disease    Depression    Diabetes mellitus without complication (HCC)    GERD (gastroesophageal reflux disease)    Hypertension    Hypothyroidism    Past Surgical History:  Past Surgical History:  Procedure Laterality Date   ACHILLES TENDON REPAIR     BACK SURGERY     HERNIA REPAIR     SHOULDER SURGERY     HPI:  Tamara [name] is a 76 y.o. female with medical history significant for Insulin-dependent type 2 diabetes, depression and anxiety on lamictal, prior subdural hematuria, memory impairment, hospitalized in June 2023 with recurrent hypoglycemia related to medication, , CKD,  back  surgeries with chronic pain, ambulatory dysfunction and ffalls, ambulant at baseline with a walker, who presents to the ED from urgent care as a code stroke after  presenting with dysarthria following a fall the night prior in which she hit her head. MRI revealed no acute infarct, mass effect or extra-axial collection. No acute or chronic hemorrhage. Normal white matter signal. Generalized volume loss.   Assessment / Plan / Recommendation Clinical Impression  Pt presents with baseline memory loss that is further excerbated by hospitalization. Pt's private case manager and her private caregiver were both present during this evaluation. Pt's case manager states that pt has paid caregivers throughout the day and they stay overnight several times per week. While he explains that the caregivers were hired out of concern for pt's fall risk, he also states that pt no longer manages her medications, finances, drives with her caregivers performing all iADLs and ADLs (cooking, bathing etc). During this evaluation, pt presents as pleasantly confused. When consuming a graham cracker, pt stated that it appeared pink and tasted like "strawberry." Pt's case manager reports baseline memory loss without dementia dx as of yet. He reports that "they have been trying to get a dementia dx." He also reports acute delirium and confusion while being hospitalized "so something must be going on." Extensive education provided by this writer that it is not uncommon for pts with pre-existing memory loss to experience acute delirium and increased confusion while hospitalized. For example, pt continues to state that she feels she is at the hospital for a colonoscopy. In my clinical opinion, pt is not able to recall (d/t pre-existing memory loss) the reason that she is in the hospital so she fills in with something that is already known to her such as a colonoscopy. 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 Skilled ST intervention is not indicated at this time.    SLP Assessment  SLP Recommendation/Assessment: Patient does  not need any further Speech Lanaguage Pathology Services SLP Visit Diagnosis: Cognitive communication deficit (R41.841)    Recommendations for follow up therapy are one component of a multi-disciplinary discharge planning process, led by the attending physician.  Recommendations may be updated based on patient status, additional functional criteria and insurance authorization.    Follow Up Recommendations  No SLP follow up    Assistance Recommended at Discharge  Frequent or constant Supervision/Assistance  Functional Status Assessment Patient has not had a recent decline in their functional status        SLP Evaluation Cognition  Overall Cognitive Status: History of cognitive impairments - at baseline Arousal/Alertness: Awake/alert Orientation Level: Oriented X4       Comprehension  Auditory Comprehension Overall Auditory Comprehension: Appears within functional limits for tasks assessed    Expression Expression Primary Mode of Expression: Verbal Verbal Expression Overall Verbal Expression: Appears within functional limits for tasks assessed Written Expression Dominant Hand: Right Written Expression: Not tested   Oral / Motor  Oral Motor/Sensory Function Overall Oral Motor/Sensory Function: Within functional limits Motor Speech Overall Motor Speech: Appears within functional limits for tasks assessed           Tamara [name] B. Dreama Saa, M.S., CCC-SLP, Tree surgeon Certified Brain Injury Specialist [facility]  [facility] Rehabilitation Services Office [phone] Ascom [phone] Fax [phone]

## 2022-07-17 NOTE — Evaluation (Signed)
Clinical/Bedside Swallow Evaluation Patient Details  Name: Lanetra Hartley MRN: [id number] Date of Birth: [birth year]  Today's Date: 07/17/2022 Time: SLP Start Time (ACUTE ONLY): 1130 SLP Stop Time (ACUTE ONLY): 1140 SLP Time Calculation (min) (ACUTE ONLY): 10 min  Past Medical History:  Past Medical History:  Diagnosis Date   Anemia    Chronic kidney disease    Depression    Diabetes mellitus without complication (HCC)    GERD (gastroesophageal reflux disease)    Hypertension    Hypothyroidism    Past Surgical History:  Past Surgical History:  Procedure Laterality Date   ACHILLES TENDON REPAIR     BACK SURGERY     HERNIA REPAIR     SHOULDER SURGERY     HPI:  Karey Suthers is a 76 y.o. female with medical history significant for Insulin-dependent type 2 diabetes, depression and anxiety on lamictal, prior subdural hematuria, memory impairment, hospitalized in June 2023 with recurrent hypoglycemia related to medication, , CKD,  back surgeries with chronic pain, ambulatory dysfunction and ffalls, ambulant at baseline with a walker, who presents to the ED from urgent care as a code stroke after presenting with dysarthria following a fall the night prior in which she hit her head. MRI revealed no acute infarct, mass effect or extra-axial collection. No acute or chronic hemorrhage. Normal white matter signal. Generalized volume loss.    Assessment / Plan / Recommendation  Clinical Impression  Pt was suboptimally reclined in bed d/t back and hip pain. Despite this, she demosntrated adequate oropharyngeal abilities when consuming thin liquids via straw, puree and solids. All phases of swallow function were appropriate at this time. Recommend regular diet with thin liquids via straw, medicine whole with thin liquids. Skilled ST services are not indicated at this time. SLP Visit Diagnosis: Dysphagia, unspecified (R13.10)    Aspiration Risk  Mild aspiration risk (d/t pt positioning)    Diet  Recommendation Regular;Thin liquid   Liquid Administration via: Straw;Cup Medication Administration: Whole meds with liquid Supervision: Intermittent supervision to cue for compensatory strategies;Patient able to self feed Compensations: Minimize environmental distractions;Slow rate;Small sips/bites Postural Changes: Seated upright at 90 degrees;Remain upright for at least 30 minutes after po intake    Other  Recommendations Oral Care Recommendations: Oral care BID    Recommendations for follow up therapy are one component of a multi-disciplinary discharge planning process, led by the attending physician.  Recommendations may be updated based on patient status, additional functional criteria and insurance authorization.  Follow up Recommendations No SLP follow up      Assistance Recommended at Discharge Frequent or constant Supervision/Assistance (pt had assistance at baseline)  Functional Status Assessment Patient has not had a recent decline in their functional status    Swallow Study   General Date of Onset: 07/16/22 HPI: Brenlyn Beshara is a 76 y.o. female with medical history significant for Insulin-dependent type 2 diabetes, depression and anxiety on lamictal, prior subdural hematuria, memory impairment, hospitalized in June 2023 with recurrent hypoglycemia related to medication, , CKD,  back surgeries with chronic pain, ambulatory dysfunction and ffalls, ambulant at baseline with a walker, who presents to the ED from urgent care as a code stroke after presenting with dysarthria following a fall the night prior in which she hit her head. MRI revealed no acute infarct, mass effect or extra-axial collection. No acute or chronic hemorrhage. Normal white matter signal. Generalized volume loss. Type of Study: Bedside Swallow Evaluation Previous Swallow Assessment: none in chart Diet Prior to this  Study: NPO Temperature Spikes Noted: No Respiratory Status: Room air History of Recent  Intubation: No Behavior/Cognition: Alert;Cooperative;Pleasant mood;Distractible;Confused Oral Cavity Assessment: Within Functional Limits Oral Care Completed by SLP: No Oral Cavity - Dentition: Adequate natural dentition Vision: Functional for self-feeding Self-Feeding Abilities: Needs assist;Needs set up Patient Positioning: Partially reclined (d/t back pain) Baseline Vocal Quality: Normal Volitional Cough: Strong Volitional Swallow: Unable to elicit    Oral/Motor/Sensory Function Overall Oral Motor/Sensory Function: Within functional limits   Ice Chips Ice chips: Not tested   Thin Liquid Thin Liquid: Within functional limits Presentation: Self Fed;Straw    Nectar Thick Nectar Thick Liquid: Not tested   Honey Thick Honey Thick Liquid: Not tested   Puree Puree: Within functional limits   Solid     Solid: Within functional limits Presentation: Self Fed     Chyann Ambrocio B. Dreama Saa, M.S., CCC-SLP, Tree surgeon Certified Brain Injury Specialist [facility]  [facility] Rehabilitation Services Office [phone] Ascom [phone] Fax [phone]

## 2022-07-18 DIAGNOSIS — R471 Dysarthria and anarthria: Secondary | ICD-10-CM

## 2022-07-18 DIAGNOSIS — R2990 Unspecified symptoms and signs involving the nervous system: Secondary | ICD-10-CM

## 2022-07-18 LAB — URINE CULTURE: Culture: <10000 — AB

## 2022-07-18 LAB — GLUCOSE, CAPILLARY
Glucose-Capillary: 159 mg/dL — ABNORMAL HIGH (ref 70–99)
Glucose-Capillary: 178 mg/dL — ABNORMAL HIGH (ref 70–99)
Glucose-Capillary: 176 mg/dL — ABNORMAL HIGH (ref 70–99)

## 2022-07-18 MED ORDER — IBUPROFEN 400 MG PO TABS
400.0000 mg | ORAL_TABLET | Freq: Once | ORAL | Status: AC
  Administered 2022-07-18: 400 mg via ORAL
  Filled 2022-07-18: qty 1

## 2022-07-18 MED ORDER — METOPROLOL TARTRATE 5 MG/5ML IV SOLN
5.0000 mg | Freq: Once | INTRAVENOUS | Status: AC
  Administered 2022-07-18: 5 mg via INTRAVENOUS
  Filled 2022-07-18: qty 5

## 2022-07-18 MED ORDER — IBUPROFEN 400 MG PO TABS
600.0000 mg | ORAL_TABLET | Freq: Four times a day (QID) | ORAL | Status: DC | PRN
  Administered 2022-07-18 (×2): 600 mg via ORAL
  Filled 2022-07-18 (×2): qty 2

## 2022-07-18 NOTE — TOC Transition Note (Addendum)
Transition of Care [facility]) - CM/SW Discharge Note   Patient Details  Name: Tamara [name] MRN: [id number] Date of Birth: [birth year]  [facility] CM/SW Contact:  Bing Quarry, RN Phone Number: 07/18/2022, 2:25 PM   Clinical Narrative:  07/18/22: See prior Transition note but HH via [facility]. DME via [facility]. Caregivers will transport home. Gabriel Cirri RN CM    Update: 450 pm: Checked with [facility] and order for RW was kicked back and so now it will be delivered to home tomorrow and EMS will transport home today due to steps into house and only one caregiver present. Gabriel Cirri RN CM   Final next level of care: Home w Home Health Services Barriers to Discharge: Barriers Resolved   Patient Goals and CMS Choice     Choice offered to / list presented to : Patient  Discharge Placement                       Discharge Plan and Services                DME Arranged: Walker rolling DME Agency: [facility] Date DME Agency Contacted: 07/18/22 Time DME Agency Contacted: [phone] Representative spoke with at DME Agency: Leavy Cella HH Arranged: OT, PT HH Agency: [facility] Date HH Agency Contacted: 07/18/22 Time HH Agency Contacted: [id number] Representative spoke with at [facility] Agency: Garret Teale Cower  Social Determinants of Health (SDOH) Interventions     Readmission Risk Interventions     No data to display

## 2022-07-18 NOTE — TOC Transition Note (Addendum)
Transition of Care [facility]) - CM/SW Discharge Note   Patient Details  Name: Tamara [name] MRN: [id number] Date of Birth: 1945-12-23  [facility] of Care [facility]) CM/SW Contact:  Bing Quarry, RN Phone Number: 07/18/2022, 1:52 PM   Clinical Narrative:  8/19: Patient is discharing today and has 24/7 caregivers and caregiver present per provider that will transport home from acute care. RW ordered from [employer] to be delivered to room and contacted [facility] while attempting to reach patient or caregivers. [facility] confirmed and patient accepted. Patient to follow up with neurology post discharge. Gabriel Cirri RN CM            Patient Goals and CMS Choice        Discharge Placement                       Discharge Plan and Services                                     Social Determinants of Health (SDOH) Interventions     Readmission Risk Interventions     No data to display

## 2022-07-18 NOTE — Procedures (Signed)
Routine EEG Report  Tamara [name] is a 76 y.o. female with a history of encephalopathy who is undergoing an EEG to evaluate for seizures.  Report: This EEG was acquired with electrodes placed according to the International 10-20 electrode system (including Fp1, Fp2, F3, F4, C3, C4, P3, P4, O1, O2, T3, T4, T5, T6, A1, A2, Fz, Cz, Pz). The following electrodes were missing or displaced: none.  The occipital dominant rhythm was 7-8 Hz. This activity is reactive to stimulation. Drowsiness was manifested by background fragmentation; deeper stages of sleep were identified by K complexes and sleep spindles. There was no focal slowing. There were no interictal epileptiform discharges. There were no electrographic seizures identified. There was no abnormal response to photic stimulation or hyperventilation.   Impression and clinical correlation: This EEG was obtained while awake and asleep and is abnormal due to mild diffuse slowing indicative of global cerebral dysfunction. Epileptiform abnormalities were not seen during this recording.  Bing Neighbors, MD [facility] [phone]  If 7pm- 7am, please page neurology on call as listed in [facility].

## 2022-07-18 NOTE — Discharge Summary (Signed)
Tamara [name] FTD:[id number] DOB: 12-18-1945 DOA: 07/16/2022  PCP: [facility]  Admit date: 07/16/2022 Discharge date: 07/18/2022  Time spent: 45 minutes  Recommendations for Outpatient Follow-up:  Neurology f/u as scheduled     Discharge Diagnoses:  Principal Problem:   Dysarthria Active Problems:   Hyponatremia   Frequent falls   Diabetes mellitus ([place])   Essential hypertension   Acquired hypothyroidism   Sleep apnea   Depression   Chronic back pain   Discharge Condition: stable  Diet recommendation: heart healthy  Filed Weights   07/16/22 2206  Weight: 72.7 kg    History of present illness:  From admission h and p Tamara [name] is a 76 y.o. female with medical history significant for Insulin-dependent type 2 diabetes, depression and anxiety on lamictal, prior subdural hematuria, memory impairment, hospitalized in June 2023 with recurrent hypoglycemia related to medication, , CKD,  back surgeries with chronic pain, ambulatory dysfunction and ffalls, ambulant at baseline with a walker, who presents to the ED from urgent care as a code stroke after presenting with dysarthria following a fall the night prior in which she hit her head.  She initially complained of back pain but on arrival to urgent care she mentioned that she had been having difficulty getting her words out and she was noted at times to be intelligible.  Speech change occurred at 1610 on 8/17.  The circumstances of the fall was difficult to elucidate.  Patient said who foods were getting tangled up because her mouth was very dry.  She said she was on a diet and did a colonoscopy prep, however chart review does not reveal an encounter for colonoscopy  Hospital Course:   # Dysarthria Initial concern for acute stroke given dysarthria, facial droop. However MRI is negative, no high-grade lesion seen on CTA. Patient is [facility] by [facility] neurology for memory loss and other cognitive issues. 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 Her dysarthria and facial droop have resolved. Per her geriatric case manager Neena Rhymes [phone]), has had episodes like this in the past. She was recently treated with oral corticosteroids and opioids for low back pain. Neurology consulted, EEG also obtained, negative for seizure. - outpatient neurology f/u   # Hyponatremia Baseline appears to be around 135. Was 129 on arrival, spontaneously improved to 134    # T2DM Here glucose appropriate - continued home metformin   # Hypothyroid - home levothyroxine continued   # GAD - continued home wellbutrin, buspirone, lamictal  # Chronic low back and hip pain.   Saw [facility] ortho last week for back pain, plain films stable from priors, has history lumbar surgery. Plan is f/u there in a week. Here has not complained of low back pain but does complain of hip pain, x-ray of hips shows no fracture or other acute process but does show OA - home duloxetine   - outpt ortho f/u as scheduled  Procedures: none   Consultations: neurology  Discharge Exam: Vitals:   07/18/22 0355 07/18/22 0924  BP: (!) 184/82 (!) 176/80  Pulse: 94 99  Resp: 20 20  Temp: 97.8 F (36.6 C) 98.3 F (36.8 C)  SpO2: 99% 100%    General exam: Appears calm and comfortable  Respiratory system: Clear to auscultation. Respiratory effort normal. Cardiovascular system: S1 & S2 heard, RRR. No JVD, murmurs,  rubs, gallops or clicks. No pedal edema. Gastrointestinal system: Abdomen is nondistended, soft and nontender. No organomegaly or masses felt. Normal bowel sounds heard. Central nervous system: Alert and oriented. No focal neurological deficits. Extremities: Symmetric 5 x 5 power. Skin: No rashes, lesions or ulcers Psychiatry: confused, pleasant  Discharge  Instructions   Discharge Instructions     Diet - low sodium heart healthy   Complete by: As directed    Increase activity slowly   Complete by: As directed       Allergies as of 08/03/2022       Reactions   Ace Inhibitors Cough        Medication List     TAKE these medications    acetaminophen 325 MG tablet Commonly known as: TYLENOL Take 650 mg by mouth every 6 (six) hours as needed.   ARIPiprazole 5 MG tablet Commonly known as: ABILIFY Take 5 mg by mouth at bedtime.   aspirin 81 MG chewable tablet Chew 81 mg by mouth daily.   atorvastatin 20 MG tablet Commonly known as: LIPITOR Take 20 mg by mouth at bedtime.   blood glucose meter kit and supplies Kit Dispense based on patient and insurance preference. Use up to four times daily as directed.   buPROPion HCl ER (XL) 450 MG Tb24 Take 450 mg by mouth daily.   busPIRone 10 MG tablet Commonly known as: BUSPAR Take 10 mg by mouth 2 (two) times daily.   diclofenac Sodium 1 % Gel Commonly known as: VOLTAREN Apply 2 g topically 4 (four) times daily.   DULoxetine 60 MG capsule Commonly known as: CYMBALTA Take 60 mg by mouth 2 (two) times daily.   esomeprazole 20 MG capsule Commonly known as: NEXIUM Take 20 mg by mouth daily. To prevent coughing after eating.   fluticasone 50 MCG/ACT nasal spray Commonly known as: FLONASE Place 1 spray into both nostrils 2 (two) times daily.   insulin glargine 100 UNIT/ML Solostar Pen Commonly known as: LANTUS Inject 10 Units into the skin daily.   lamoTRIgine 150 MG tablet Commonly known as: LAMICTAL Take 150 mg by mouth 2 (two) times daily.   levothyroxine 112 MCG tablet Commonly known as: SYNTHROID Take 112 mcg by mouth daily.   metFORMIN 500 MG tablet Commonly known as: GLUCOPHAGE Take 500 mg by mouth 2 (two) times daily.   propranolol 40 MG tablet Commonly known as: INDERAL Take 0.5 tablets (20 mg total) by mouth 2 (two) times daily.   tiZANidine 2 MG  tablet Commonly known as: ZANAFLEX Take 0.5 tablets (1 mg total) by mouth at bedtime as needed.   Trulicity 1.5 VQ/0.0QQ Sopn Generic drug: Dulaglutide Inject 0.5 mLs into the skin once a week.   valsartan 80 MG tablet Commonly known as: DIOVAN Take 80 mg by mouth daily.       Allergies  Allergen Reactions   Ace Inhibitors Cough    Follow-up Information     Your neurologist as scheduled Follow up.                   The results of significant diagnostics from this hospitalization (including imaging, microbiology, ancillary and laboratory) are listed below for reference.    Significant Diagnostic Studies: EEG adult  Result Date: 08/03/2022 Derek Jack, MD     03-Aug-2022  1:06 PM Routine EEG Report Tamara [name] is a 76 y.o. female with a history of encephalopathy who is undergoing an EEG to evaluate for seizures. Report: This EEG  was acquired with electrodes placed according to the International 10-20 electrode system (including Fp1, Fp2, F3, F4, C3, C4, P3, P4, O1, O2, T3, T4, T5, T6, A1, A2, Fz, Cz, Pz). The following electrodes were missing or displaced: none. The occipital dominant rhythm was 7-8 Hz. This activity is reactive to stimulation. Drowsiness was manifested by background fragmentation; deeper stages of sleep were identified by K complexes and sleep spindles. There was no focal slowing. There were no interictal epileptiform discharges. There were no electrographic seizures identified. There was no abnormal response to photic stimulation or hyperventilation. Impression and clinical correlation: This EEG was obtained while awake and asleep and is abnormal due to mild diffuse slowing indicative of global cerebral dysfunction. Epileptiform abnormalities were not seen during this recording. Su Monks, MD [facility] [phone] If 7pm- 7am, please page neurology on call as listed in [place].   ECHOCARDIOGRAM COMPLETE  Result Date: 07/17/2022     ECHOCARDIOGRAM REPORT   Patient Name:   Tamara [name] Date of Exam: 07/17/2022 Medical Rec #:  [id number]     Height:       67.0 in Accession #:    [id number]    Weight:       160.3 lb Date of Birth:  [birth year]     BSA:          1.841 m Patient Age:    26 years      BP:           177/82 mmHg Patient Gender: F             HR:           87 bpm. Exam Location:  [facility] Procedure: 2D Echo, Color Doppler and Cardiac Doppler Indications:     I63.9 Stroke  History:         Patient has no prior history of Echocardiogram examinations.                  CKD; Risk Factors:Hypertension and Diabetes.  Sonographer:     Charmayne Sheer Referring Phys:  [place] Diagnosing Phys: Ida Rogue MD  Sonographer Comments: Technically difficult study due to poor echo windows. Image acquisition challenging due to uncooperative patient. IMPRESSIONS  1. Left ventricular ejection fraction, by estimation, is 60 to 65%. The left ventricle has normal function. The left ventricle has no regional wall motion abnormalities. Left ventricular diastolic parameters are consistent with Grade I diastolic dysfunction (impaired relaxation).  2. Right ventricular systolic function is normal. The right ventricular size is normal. Tricuspid regurgitation signal is inadequate for assessing PA pressure.  3. The mitral valve is normal in structure. Mild mitral valve regurgitation. No evidence of mitral stenosis.  4. The aortic valve was not well visualized. Aortic valve regurgitation is not visualized. No aortic stenosis is present.  5. The inferior vena cava is normal in size with greater than 50% respiratory variability, suggesting right atrial pressure of 3 mmHg. FINDINGS  Left Ventricle: Left ventricular ejection fraction, by estimation, is 60 to 65%. The left ventricle has normal function. The left ventricle has no regional wall motion abnormalities. The left ventricular internal cavity size was normal in size. There is  no left ventricular  hypertrophy. Left ventricular diastolic parameters are consistent with Grade I diastolic dysfunction (impaired relaxation). Right Ventricle: The right ventricular size is normal. No increase in right ventricular wall thickness. Right ventricular systolic function is normal. Tricuspid regurgitation signal is inadequate for assessing PA pressure. Left Atrium: Left  atrial size was normal in size. Right Atrium: Right atrial size was normal in size. Pericardium: There is no evidence of pericardial effusion. Mitral Valve: The mitral valve is normal in structure. Mild mitral valve regurgitation. No evidence of mitral valve stenosis. Tricuspid Valve: The tricuspid valve is normal in structure. Tricuspid valve regurgitation is not demonstrated. No evidence of tricuspid stenosis. Aortic Valve: The aortic valve was not well visualized. Aortic valve regurgitation is not visualized. No aortic stenosis is present. Aortic valve mean gradient measures 3.0 mmHg. Aortic valve peak gradient measures 4.7 mmHg. Aortic valve area, by VTI measures 2.86 cm. Pulmonic Valve: The pulmonic valve was normal in structure. Pulmonic valve regurgitation is not visualized. No evidence of pulmonic stenosis. Aorta: The aortic root is normal in size and structure. Venous: The inferior vena cava is normal in size with greater than 50% respiratory variability, suggesting right atrial pressure of 3 mmHg. IAS/Shunts: No atrial level shunt detected by color flow Doppler.  LEFT VENTRICLE PLAX 2D LVIDd:         3.48 cm     Diastology LVIDs:         2.13 cm     LV e' medial:    5.66 cm/s LV PW:         1.14 cm     LV E/e' medial:  11.6 LV IVS:        0.88 cm     LV e' lateral:   7.18 cm/s LVOT diam:     2.00 cm     LV E/e' lateral: 9.1 LV SV:         48 LV SV Index:   26 LVOT Area:     3.14 cm  LV Volumes (MOD) LV vol d, MOD A2C: 49.5 ml LV vol d, MOD A4C: 67.5 ml LV vol s, MOD A2C: 20.0 ml LV vol s, MOD A4C: 28.7 ml LV SV MOD A2C:     29.5 ml LV SV MOD A4C:      67.5 ml LV SV MOD BP:      35.4 ml RIGHT VENTRICLE RV Basal diam:  3.01 cm RV Mid diam:    3.30 cm LEFT ATRIUM             Index        RIGHT ATRIUM          Index LA diam:        3.70 cm 2.01 cm/m   RA Area:     8.36 cm LA Vol (A2C):   43.6 ml 23.69 ml/m  RA Volume:   15.30 ml 8.31 ml/m LA Vol (A4C):   31.7 ml 17.22 ml/m LA Biplane Vol: 37.3 ml 20.26 ml/m  AORTIC VALVE                    PULMONIC VALVE AV Area (Vmax):    2.62 cm     PV Vmax:       0.81 m/s AV Area (Vmean):   2.41 cm     PV Peak grad:  2.7 mmHg AV Area (VTI):     2.86 cm AV Vmax:           108.00 cm/s AV Vmean:          75.300 cm/s AV VTI:            0.169 m AV Peak Grad:      4.7 mmHg AV Mean Grad:      3.0 mmHg LVOT  Vmax:         90.10 cm/s LVOT Vmean:        57.700 cm/s LVOT VTI:          0.154 m LVOT/AV VTI ratio: 0.91  AORTA Ao Root diam: 2.90 cm MITRAL VALVE MV Area (PHT): 7.02 cm    SHUNTS MV Decel Time: 108 msec    Systemic VTI:  0.15 m MV E velocity: 65.60 cm/s  Systemic Diam: 2.00 cm MV A velocity: 89.10 cm/s MV E/A ratio:  0.74 Ida Rogue MD Electronically signed by Ida Rogue MD Signature Date/Time: 07/17/2022/10:51:31 AM    Final    MR BRAIN WO CONTRAST  Result Date: 07/16/2022 CLINICAL DATA:  Right facial droop EXAM: MRI HEAD WITHOUT CONTRAST TECHNIQUE: Multiplanar, multiecho pulse sequences of the brain and surrounding structures were obtained without intravenous contrast. COMPARISON:  None Available. FINDINGS: Brain: No acute infarct, mass effect or extra-axial collection. No acute or chronic hemorrhage. Normal white matter signal. Generalized volume loss. The midline structures are normal. Vascular: Major flow voids are preserved. Skull and upper cervical spine: Normal calvarium and skull base. Visualized upper cervical spine and soft tissues are normal. Sinuses/Orbits:No paranasal sinus fluid levels or advanced mucosal thickening. No mastoid or middle ear effusion. Normal orbits. IMPRESSION: 1. No acute  intracranial abnormality. 2. Generalized volume loss. Electronically Signed   By: Ulyses Jarred M.D.   On: 07/16/2022 22:07   DG Pelvis 1-2 Views  Result Date: 07/16/2022 CLINICAL DATA:  Left hip pain EXAM: PELVIS - 1-2 VIEW COMPARISON:  Radiographs 06/05/2021 FINDINGS: No fracture or dislocation. Degenerative changes pubic symphysis, both hips, SI joints and lower lumbar spine. Lumbar spine fusion hardware. Enteric contrast within the bladder from CT earlier today. IMPRESSION: No acute fracture or dislocation. Moderate bilateral hip degenerative arthritis. Electronically Signed   By: Placido Sou M.D.   On: 07/16/2022 20:18   CT ANGIO HEAD NECK W WO CM W PERF (CODE STROKE)  Result Date: 07/16/2022 CLINICAL DATA:  Slurred speech and right facial droop EXAM: CT ANGIOGRAPHY HEAD AND NECK CT PERFUSION BRAIN TECHNIQUE: Multidetector CT imaging of the head and neck was performed using the standard protocol during bolus administration of intravenous contrast. Multiplanar CT image reconstructions and MIPs were obtained to evaluate the vascular anatomy. Carotid stenosis measurements (when applicable) are obtained utilizing NASCET criteria, using the distal internal carotid diameter as the denominator. Multiphase CT imaging of the brain was performed following IV bolus contrast injection. Subsequent parametric perfusion maps were calculated using RAPID software. RADIATION DOSE REDUCTION: This exam was performed according to the departmental dose-optimization program which includes automated exposure control, adjustment of the mA and/or kV according to patient size and/or use of iterative reconstruction technique. CONTRAST:  127m OMNIPAQUE IOHEXOL 350 MG/ML SOLN COMPARISON:  None Available. FINDINGS: CTA NECK FINDINGS SKELETON: There is no bony spinal canal stenosis. No lytic or blastic lesion. OTHER NECK: Normal pharynx, larynx and major salivary glands. No cervical lymphadenopathy. Unremarkable thyroid gland.  UPPER CHEST: No pneumothorax or pleural effusion. No nodules or masses. AORTIC ARCH: There is no calcific atherosclerosis of the aortic arch. There is no aneurysm, dissection or hemodynamically significant stenosis of the visualized portion of the aorta. Normal variant aortic arch branching pattern with the left vertebral artery arising independently from the aortic arch. The visualized proximal subclavian arteries are widely patent. RIGHT CAROTID SYSTEM: Normal without aneurysm, dissection or stenosis. LEFT CAROTID SYSTEM: Normal without aneurysm, dissection or stenosis. VERTEBRAL ARTERIES: Left dominant configuration. Both origins  are clearly patent. There is no dissection, occlusion or flow-limiting stenosis to the skull base (V1-V3 segments). CTA HEAD FINDINGS POSTERIOR CIRCULATION: --Vertebral arteries: Normal V4 segments. --Inferior cerebellar arteries: Normal. --Basilar artery: Normal. --Superior cerebellar arteries: Normal. --Posterior cerebral arteries (PCA): Normal. ANTERIOR CIRCULATION: --Intracranial internal carotid arteries: Normal. --Anterior cerebral arteries (ACA): Normal. Absent right A1 segment, normal variant --Middle cerebral arteries (MCA): Normal. VENOUS SINUSES: As permitted by contrast timing, patent. ANATOMIC VARIANTS: None Review of the MIP images confirms the above findings. CT Brain Perfusion Findings: ASPECTS: 10 CBF (<30%) Volume: 66m Perfusion (Tmax>6.0s) volume: 330mMismatch Volume: 36m49mnfarction Location:No core infarct. The perfusion deficit is located in the right temporal lobe but is of questionable accuracy. MRI would provide a more definitive answer. IMPRESSION: 1. No emergent large vessel occlusion or high-grade stenosis of the intracranial arteries. 2. No core infarct. 3 mL perfusion deficit is located in the right temporal lobe but is of questionable accuracy. MRI would provide a more definitive answer. Electronically Signed   By: KevUlyses JarredD.   On: 07/16/2022 19:53    CT HEAD CODE STROKE WO CONTRAST  Result Date: 07/16/2022 CLINICAL DATA:  Code stroke.  Acute neuro deficit.  Slurred speech EXAM: CT HEAD WITHOUT CONTRAST TECHNIQUE: Contiguous axial images were obtained from the base of the skull through the vertex without intravenous contrast. RADIATION DOSE REDUCTION: This exam was performed according to the departmental dose-optimization program which includes automated exposure control, adjustment of the mA and/or kV according to patient size and/or use of iterative reconstruction technique. COMPARISON:  CT head 05/08/2022 FINDINGS: Brain: Mild atrophy. Mild white matter hypodensity bilaterally appears chronic. Negative for acute infarct, hemorrhage, mass. Vascular: Negative for hyperdense vessel Skull: Negative Sinuses/Orbits: Paranasal sinuses clear.  Negative orbit Other: None ASPECTS ([place] Program Early CT Score) - Ganglionic level infarction (caudate, lentiform nuclei, internal capsule, insula, M1-M3 cortex): 7 - Supraganglionic infarction (M4-M6 cortex): 3 Total score (0-10 with 10 being normal): 10 IMPRESSION: 1. No acute intracranial abnormality 2. ASPECTS is 10 3. Mild atrophy and mild chronic microvascular ischemic change in the white matter. 4. These results were called by telephone at the time of interpretation on 07/16/2022 at 6:50 pm to provider PHILLIP STAFFORD , who verbally acknowledged these results. Electronically Signed   By: ChaFranchot GalloD.   On: 07/16/2022 18:51    Microbiology: Recent Results (from the past 240 hour(s))  Urine Culture     Status: Abnormal   Collection Time: 07/17/22  1:56 AM   Specimen: Urine, Random  Result Value Ref Range Status   Specimen Description   Final    URINE, RANDOM Performed at [facility] [id number] Special Requests   Final    NONE Performed at [facility] [id number] Culture (A)  Final    <10,000  COLONIES/mL INSIGNIFICANT GROWTH Performed at [facility] [id number] Report Status 07/18/2022 FINAL  Final     Labs: Basic Metabolic Panel: Recent Labs  Lab 07/16/22 1751 07/16/22 1914 07/17/22 0133  NA 129* 129* 134*  K 4.3 4.6  --   CL 92* 91*  --   CO2 27 26  --   GLUCOSE 155* 148*  --   BUN 22 22  --   CREATININE 0.88 0.82  --   CALCIUM 9.3 9.5  --  Liver Function Tests: Recent Labs  Lab 07/16/22 1751 07/16/22 1914  AST 22 33  ALT 19 18  ALKPHOS 86 83  BILITOT <0.1* 1.0  PROT 7.3 7.5  ALBUMIN 4.0 4.1   Recent Labs  Lab 07/16/22 1751  LIPASE 30   No results for input(s): "AMMONIA" in the last 168 hours. CBC: Recent Labs  Lab 07/16/22 1751 07/16/22 1914  WBC 10.9* 12.2*  NEUTROABS 8.0* 9.6*  HGB 11.6* 11.8*  HCT 35.6* 36.7  MCV 84.4 83.6  PLT 394 377   Cardiac Enzymes: No results for input(s): "CKTOTAL", "CKMB", "CKMBINDEX", "TROPONINI" in the last 168 hours. BNP: BNP (last 3 results) No results for input(s): "BNP" in the last 8760 hours.  ProBNP (last 3 results) No results for input(s): "PROBNP" in the last 8760 hours.  CBG: Recent Labs  Lab 07/17/22 1146 07/17/22 1551 07/17/22 2110 07/18/22 0736 07/18/22 1236  GLUCAP 158* 219* 190* 159* 178*       Signed:  Desma Maxim MD.  [facility] 07/18/2022, 1:32 PM

## 2022-07-18 NOTE — TOC Progression Note (Signed)
Transition of Care [facility]) - Progression Note    Patient Details  Name: Tamara [name] MRN: [id number] Date of Birth: 09-23-1946  [facility] CM/SW Contact  Bing Quarry, RN Phone Number: 07/18/2022, 1:11 PM  Clinical Narrative:  8/19: Attempted to contact patient earlier but having in room procedure done. Gabriel Cirri RN CM           Expected Discharge Plan and Services                                                 Social Determinants of Health (SDOH) Interventions    Readmission Risk Interventions     No data to display

## 2022-07-18 NOTE — Progress Notes (Addendum)
Patient requested pain medication at approximately 3 am. Upon assessment, patients speech was very slurred and patient seemed disoriented. Patient cleaned, linen changed and patient went to sleep. Upon assessment at 5:30 am today, patient awakened for scheduled medication. Patient with slurred speech and disorientation again. After patient took medication, she became more awake, alert and her speech was clear. Private home health aide at bedside at time of interaction stated that she too noticed that the patients speech was slurred and then extremely clear at times. Will continue to monitor.

## 2022-07-18 NOTE — Progress Notes (Signed)
Patient is being discharged home via ambulance. Caregiver is at bedside and will be going home with patient. All discharge instructions reviewed with both patient and caregiver. Son updated via telephone.

## 2022-07-19 ENCOUNTER — Telehealth: Payer: Self-pay | Admitting: Obstetrics and Gynecology

## 2022-07-19 ENCOUNTER — Emergency Department
Admission: EM | Admit: 2022-07-19 | Discharge: 2022-07-19 | Disposition: A | Discharge status: Home / Self Care | Payer: Medicare Other | Source: Home / Self Care | Attending: Emergency Medicine | Admitting: Emergency Medicine

## 2022-07-19 ENCOUNTER — Encounter: Payer: Self-pay | Admitting: Emergency Medicine

## 2022-07-19 ENCOUNTER — Emergency Department: Payer: Medicare Other

## 2022-07-19 ENCOUNTER — Other Ambulatory Visit: Payer: Self-pay

## 2022-07-19 DIAGNOSIS — R7309 Other abnormal glucose: Secondary | ICD-10-CM | POA: Insufficient documentation

## 2022-07-19 DIAGNOSIS — N390 Urinary tract infection, site not specified: Secondary | ICD-10-CM | POA: Diagnosis not present

## 2022-07-19 DIAGNOSIS — D72829 Elevated white blood cell count, unspecified: Secondary | ICD-10-CM | POA: Insufficient documentation

## 2022-07-19 DIAGNOSIS — M7981 Nontraumatic hematoma of soft tissue: Secondary | ICD-10-CM | POA: Insufficient documentation

## 2022-07-19 DIAGNOSIS — S7002XA Contusion of left hip, initial encounter: Secondary | ICD-10-CM

## 2022-07-19 DIAGNOSIS — R700 Elevated erythrocyte sedimentation rate: Secondary | ICD-10-CM | POA: Insufficient documentation

## 2022-07-19 DIAGNOSIS — M25551 Pain in right hip: Secondary | ICD-10-CM

## 2022-07-19 DIAGNOSIS — M1990 Unspecified osteoarthritis, unspecified site: Secondary | ICD-10-CM

## 2022-07-19 LAB — CBC WITH DIFFERENTIAL/PLATELET
Abs Immature Granulocytes: 0.05 10*3/uL (ref 0.00–0.07)
Basophils Absolute: 0 10*3/uL (ref 0.0–0.1)
Basophils Relative: 0 %
Eosinophils Absolute: 0.1 10*3/uL (ref 0.0–0.5)
Eosinophils Relative: 1 %
HCT: 38.5 % (ref 36.0–46.0)
Hemoglobin: 12.4 g/dL (ref 12.0–15.0)
Immature Granulocytes: 1 %
Lymphocytes Relative: 9 %
Lymphs Abs: 0.9 10*3/uL (ref 0.7–4.0)
MCH: 26.8 pg (ref 26.0–34.0)
MCHC: 32.2 g/dL (ref 30.0–36.0)
MCV: 83.2 fL (ref 80.0–100.0)
Monocytes Absolute: 0.7 10*3/uL (ref 0.1–1.0)
Monocytes Relative: 7 %
Neutro Abs: 8.9 10*3/uL — ABNORMAL HIGH (ref 1.7–7.7)
Neutrophils Relative %: 82 %
Platelets: 429 10*3/uL — ABNORMAL HIGH (ref 150–400)
RBC: 4.63 MIL/uL (ref 3.87–5.11)
RDW: 14.2 % (ref 11.5–15.5)
WBC: 10.7 10*3/uL — ABNORMAL HIGH (ref 4.0–10.5)
nRBC: 0 % (ref 0.0–0.2)

## 2022-07-19 LAB — COMPREHENSIVE METABOLIC PANEL
ALT: 22 U/L (ref 0–44)
AST: 33 U/L (ref 15–41)
Albumin: 4.3 g/dL (ref 3.5–5.0)
Alkaline Phosphatase: 83 U/L (ref 38–126)
Anion gap: 13 (ref 5–15)
BUN: 14 mg/dL (ref 8–23)
CO2: 23 mmol/L (ref 22–32)
Calcium: 9.6 mg/dL (ref 8.9–10.3)
Chloride: 96 mmol/L — ABNORMAL LOW (ref 98–111)
Creatinine, Ser: 0.82 mg/dL (ref 0.44–1.00)
GFR, Estimated: >60 mL/min (ref >60)
Glucose, Bld: 225 mg/dL — ABNORMAL HIGH (ref 70–99)
Potassium: 4.2 mmol/L (ref 3.5–5.1)
Sodium: 132 mmol/L — ABNORMAL LOW (ref 135–145)
Total Bilirubin: 0.8 mg/dL (ref 0.3–1.2)
Total Protein: 7.6 g/dL (ref 6.5–8.1)

## 2022-07-19 LAB — SEDIMENTATION RATE: Sed Rate: 46 mm/hr — ABNORMAL HIGH (ref 0–30)

## 2022-07-19 LAB — CBG MONITORING, ED: Glucose-Capillary: 185 mg/dL — ABNORMAL HIGH (ref 70–99)

## 2022-07-19 MED ORDER — OXYCODONE-ACETAMINOPHEN 5-325 MG PO TABS: 1.0000 | ORAL_TABLET | ORAL | 0 refills | Status: DC | PRN

## 2022-07-19 MED ORDER — KETOROLAC TROMETHAMINE 15 MG/ML IJ SOLN
15.0000 mg | Freq: Once | INTRAMUSCULAR | Status: AC
  Administered 2022-07-19: 15 mg via INTRAVENOUS
  Filled 2022-07-19: qty 1

## 2022-07-19 MED ORDER — ENSURE ENLIVE PO LIQD: 1.0000 | Freq: Two times a day (BID) | ORAL | Status: DC

## 2022-07-19 NOTE — ED Triage Notes (Signed)
Discussed with dr Larinda Buttery, will wait for any orders until seen by EDP

## 2022-07-19 NOTE — Discharge Instructions (Addendum)
As we discussed you have some bruising on your hips and the left hip has a hematoma or collection of blood around it.  There is no sign of any fracture.  It does not look like it is infected at this point.  I will give you some Percocet to take to help with the pain.  1 pill 4 times a day as needed.  If the pain gets worse or if you develop a fever or redness or anything like that please return at once.  If it pain is not good enough to let you walk after couple days please return if we can get you in the hospital for pain control or rehab if need be.  Lease follow-up with your regular doctor in the next week or so and as I said do not hesitate to return if you are not making it at home.  Be careful the Percocet can make you woozy.  Do not fall.  Also do not take extra Tylenol with the Percocet.  There is Tylenol in the Percocet and you can get a Tylenol overdose if you take Tylenol with the Percocet.

## 2022-07-19 NOTE — ED Provider Notes (Signed)
[facility] Provider Note    Event Date/Time   First MD Initiated Contact with Patient 07/19/22 1104     (approximate)   History   Leg Pain   HPI  Tamara [name] is a 76 y.o. female who comes in complaining of bilateral hip pain.  Patient has had this for about 2 weeks.  Last night it was so severe she could not sleep.  She tried to get up and could not walk.  This morning she could not walk either.  She did manage to make it in here and complains of pain in both hips.  She does not really have any pain in her low back or in her pelvis.  Pain does not radiate.  The pain is better if she is not standing.  She is not running a fever.  Patient has had multiple back surgeries back really is not tender.      Physical Exam   Triage Vital Signs: ED Triage Vitals  Enc Vitals Group     BP 07/19/22 0852 (!) 167/96     Pulse Rate 07/19/22 1027 78     Resp 07/19/22 0852 18     Temp 07/19/22 0852 97.6 F (36.4 C)     Temp Source 07/19/22 0852 Oral     SpO2 07/19/22 0852 100 %     Weight 07/19/22 0851 160 lb (72.6 kg)     Height 07/19/22 0851 5\' 7"  (1.702 m)     Head Circumference --      Peak Flow --      Pain Score 07/19/22 0851 0     Pain Loc --      Pain Edu? --      Excl. in GC? --     Most recent vital signs: Vitals:   07/19/22 1311 07/19/22 1335  BP: (!) 160/93   Pulse: 97   Resp: 18   Temp:  98 F (36.7 C)  SpO2: 98%      General: Awake, no distress.  Looks well lying in the bed CV:  Good peripheral perfusion.  Regular rate and rhythm no audible murmurs Resp:  Normal effort.  Lungs are clear Abd:  No distention.  Soft and nontender Back nontender to palpation or percussion Pelvis nontender to compression Hips are tender over the greater trochanters and just above and posterior to them bilaterally.  There is full painless range of motion of her hip straight leg raise is negative bilaterally there is no numbness or tingling in the leg there  is no real weakness.  When we stand the patient up she can barely stand and she cannot pick her legs up and move them that hurts too much.  We will get her back in the bed we will try to get an MRI.  Patient had an x-ray recently in the last few days which was negative.  Patient does have a history of frequent falls but has not fallen since the x-rays.  It is possible she has insufficiency fractures or possibly avascular necrosis she has had steroids a few times.  There is a fairly large bruise over the left hip very faint on the right hip.  There is no sign of infection no redness or warmth or streaking.   ED Results / Procedures / Treatments   Labs (all labs ordered are listed, but only abnormal results are displayed) Labs Reviewed  CBC WITH DIFFERENTIAL/PLATELET - Abnormal; Notable for the following components:  Result Value   WBC 10.7 (*)    Platelets 429 (*)    Neutro Abs 8.9 (*)    All other components within normal limits  COMPREHENSIVE METABOLIC PANEL - Abnormal; Notable for the following components:   Sodium 132 (*)    Chloride 96 (*)    Glucose, Bld 225 (*)    All other components within normal limits  SEDIMENTATION RATE - Abnormal; Notable for the following components:   Sed Rate 46 (*)    All other components within normal limits  CBG MONITORING, ED - Abnormal; Notable for the following components:   Glucose-Capillary 185 (*)    All other components within normal limits     EKG     RADIOLOGY MRI read by radiology reviewed by me shows a fluid collection by the left hip.  This hip is more tender than the right hip. I discussed this with Dr. Allena Katz on-call for orthopedics who reviewed the films.  Patient has a white count of 10.7 with a sed rate of 46.  There is no outward sign of infection currently.  This leads [place] to think that this is most likely hematoma.  PROCEDURES:  Critical Care performed:   Procedures   MEDICATIONS ORDERED IN ED: Medications   ketorolac (TORADOL) 15 MG/ML injection 15 mg (15 mg Intravenous Given 07/19/22 1314)     IMPRESSION / MDM / ASSESSMENT AND PLAN / ED COURSE  I reviewed the triage vital signs and the nursing notes.  We will give the patient some Percocet she has a wheelchair at home and help at home she thinks she can manage at home she can she will come back.  Differential diagnosis includes, but is not limited to, hip contusion avascular necrosis hematoma which is actually a real diagnosis septic hip bursitis are all in the differential  Patient's presentation is most consistent with acute complicated illness / injury requiring diagnostic workup.     FINAL CLINICAL IMPRESSION(S) / ED DIAGNOSES   Final diagnoses:  Bilateral hip pain  Hematoma of left hip, initial encounter     Rx / DC Orders   ED Discharge Orders          Ordered    oxyCODONE-acetaminophen (PERCOCET) 5-325 MG tablet  Every 4 hours PRN        07/19/22 1519             Note:  This document was prepared using Dragon voice recognition software and may include unintentional dictation errors.   Arnaldo Natal, MD 07/19/22 [phone]

## 2022-07-19 NOTE — ED Notes (Signed)
EMS called for tranport back home

## 2022-07-19 NOTE — Telephone Encounter (Signed)
Pt requested rheum referral at time of discharge. Placed. Am doubtful she is suffering from a rheumatologic condition.

## 2022-07-19 NOTE — ED Notes (Addendum)
See triage note  Presents with bilateral leg pain  States couple of weeks ago she developed bilateral hip pain. States she was seen by PCP and her back surgeon   Placed on pain meds and steroid taper.States the pain became worse   Denies any injury

## 2022-07-19 NOTE — ED Triage Notes (Signed)
Pt here for bilateral leg pain that occurs with any movement.  Was here last night for same.  Has seen her PCP and surgeon for same.  Surgeon did do xray per pt.  Pt denies fevers, swelling or redness.toes dusky bilateral with mild delayed cap refill. + DP pulse bilateral. Reports has seen vascular in past for same and they said she was ok and see her next year.

## 2022-07-21 ENCOUNTER — Other Ambulatory Visit: Payer: Self-pay

## 2022-07-21 ENCOUNTER — Inpatient Hospital Stay
Admission: EM | Admit: 2022-07-21 | Discharge: 2022-07-23 | DRG: 689 | Disposition: A | Discharge status: Home Health | Payer: Medicare Other | Attending: Internal Medicine | Admitting: Internal Medicine

## 2022-07-21 ENCOUNTER — Emergency Department: Payer: Medicare Other

## 2022-07-21 ENCOUNTER — Encounter: Payer: Self-pay | Admitting: Emergency Medicine

## 2022-07-21 DIAGNOSIS — I10 Essential (primary) hypertension: Secondary | ICD-10-CM | POA: Diagnosis present

## 2022-07-21 DIAGNOSIS — E222 Syndrome of inappropriate secretion of antidiuretic hormone: Secondary | ICD-10-CM | POA: Diagnosis present

## 2022-07-21 DIAGNOSIS — K219 Gastro-esophageal reflux disease without esophagitis: Secondary | ICD-10-CM | POA: Diagnosis present

## 2022-07-21 DIAGNOSIS — E039 Hypothyroidism, unspecified: Secondary | ICD-10-CM | POA: Diagnosis present

## 2022-07-21 DIAGNOSIS — M4856XA Collapsed vertebra, not elsewhere classified, lumbar region, initial encounter for fracture: Secondary | ICD-10-CM | POA: Diagnosis present

## 2022-07-21 DIAGNOSIS — Z833 Family history of diabetes mellitus: Secondary | ICD-10-CM | POA: Diagnosis not present

## 2022-07-21 DIAGNOSIS — Z79899 Other long term (current) drug therapy: Secondary | ICD-10-CM | POA: Diagnosis not present

## 2022-07-21 DIAGNOSIS — Z888 Allergy status to other drugs, medicaments and biological substances status: Secondary | ICD-10-CM

## 2022-07-21 DIAGNOSIS — N390 Urinary tract infection, site not specified: Principal | ICD-10-CM | POA: Diagnosis present

## 2022-07-21 DIAGNOSIS — E1165 Type 2 diabetes mellitus with hyperglycemia: Secondary | ICD-10-CM

## 2022-07-21 DIAGNOSIS — F419 Anxiety disorder, unspecified: Secondary | ICD-10-CM | POA: Diagnosis present

## 2022-07-21 DIAGNOSIS — R296 Repeated falls: Secondary | ICD-10-CM | POA: Diagnosis present

## 2022-07-21 DIAGNOSIS — E871 Hypo-osmolality and hyponatremia: Secondary | ICD-10-CM | POA: Diagnosis not present

## 2022-07-21 DIAGNOSIS — Z7982 Long term (current) use of aspirin: Secondary | ICD-10-CM | POA: Diagnosis not present

## 2022-07-21 DIAGNOSIS — S32000A Wedge compression fracture of unspecified lumbar vertebra, initial encounter for closed fracture: Secondary | ICD-10-CM

## 2022-07-21 DIAGNOSIS — N3001 Acute cystitis with hematuria: Principal | ICD-10-CM

## 2022-07-21 DIAGNOSIS — Z79890 Hormone replacement therapy (postmenopausal): Secondary | ICD-10-CM

## 2022-07-21 DIAGNOSIS — F32A Depression, unspecified: Secondary | ICD-10-CM

## 2022-07-21 DIAGNOSIS — A4150 Gram-negative sepsis, unspecified: Secondary | ICD-10-CM | POA: Diagnosis not present

## 2022-07-21 DIAGNOSIS — G9341 Metabolic encephalopathy: Secondary | ICD-10-CM | POA: Diagnosis present

## 2022-07-21 DIAGNOSIS — Z794 Long term (current) use of insulin: Secondary | ICD-10-CM

## 2022-07-21 DIAGNOSIS — Z7984 Long term (current) use of oral hypoglycemic drugs: Secondary | ICD-10-CM | POA: Diagnosis not present

## 2022-07-21 DIAGNOSIS — E861 Hypovolemia: Secondary | ICD-10-CM | POA: Diagnosis present

## 2022-07-21 DIAGNOSIS — R252 Cramp and spasm: Secondary | ICD-10-CM | POA: Diagnosis present

## 2022-07-21 DIAGNOSIS — S7002XA Contusion of left hip, initial encounter: Secondary | ICD-10-CM | POA: Diagnosis present

## 2022-07-21 DIAGNOSIS — R4182 Altered mental status, unspecified: Secondary | ICD-10-CM

## 2022-07-21 DIAGNOSIS — X58XXXA Exposure to other specified factors, initial encounter: Secondary | ICD-10-CM | POA: Diagnosis present

## 2022-07-21 LAB — COMPREHENSIVE METABOLIC PANEL
ALT: 25 U/L (ref 0–44)
AST: 31 U/L (ref 15–41)
Albumin: 4.1 g/dL (ref 3.5–5.0)
Alkaline Phosphatase: 88 U/L (ref 38–126)
Anion gap: 14 (ref 5–15)
BUN: 22 mg/dL (ref 8–23)
CO2: 20 mmol/L — ABNORMAL LOW (ref 22–32)
Calcium: 9.3 mg/dL (ref 8.9–10.3)
Chloride: 94 mmol/L — ABNORMAL LOW (ref 98–111)
Creatinine, Ser: 0.9 mg/dL (ref 0.44–1.00)
GFR, Estimated: >60 mL/min (ref >60)
Glucose, Bld: 245 mg/dL — ABNORMAL HIGH (ref 70–99)
Potassium: 3.9 mmol/L (ref 3.5–5.1)
Sodium: 128 mmol/L — ABNORMAL LOW (ref 135–145)
Total Bilirubin: 1.1 mg/dL (ref 0.3–1.2)
Total Protein: 7.5 g/dL (ref 6.5–8.1)

## 2022-07-21 LAB — CBC WITH DIFFERENTIAL/PLATELET
Abs Immature Granulocytes: 0.1 10*3/uL — ABNORMAL HIGH (ref 0.00–0.07)
Basophils Absolute: 0.1 10*3/uL (ref 0.0–0.1)
Basophils Relative: 0 %
Eosinophils Absolute: 0.2 10*3/uL (ref 0.0–0.5)
Eosinophils Relative: 1 %
HCT: 37.4 % (ref 36.0–46.0)
Hemoglobin: 12.3 g/dL (ref 12.0–15.0)
Immature Granulocytes: 1 %
Lymphocytes Relative: 5 %
Lymphs Abs: 1 10*3/uL (ref 0.7–4.0)
MCH: 27.1 pg (ref 26.0–34.0)
MCHC: 32.9 g/dL (ref 30.0–36.0)
MCV: 82.4 fL (ref 80.0–100.0)
Monocytes Absolute: 1.2 10*3/uL — ABNORMAL HIGH (ref 0.1–1.0)
Monocytes Relative: 7 %
Neutro Abs: 15.1 10*3/uL — ABNORMAL HIGH (ref 1.7–7.7)
Neutrophils Relative %: 86 %
Platelets: 435 10*3/uL — ABNORMAL HIGH (ref 150–400)
RBC: 4.54 MIL/uL (ref 3.87–5.11)
RDW: 13.8 % (ref 11.5–15.5)
WBC: 17.5 10*3/uL — ABNORMAL HIGH (ref 4.0–10.5)
nRBC: 0 % (ref 0.0–0.2)

## 2022-07-21 LAB — URINALYSIS, ROUTINE W REFLEX MICROSCOPIC
Bacteria, UA: NONE SEEN
Bilirubin Urine: NEGATIVE
Glucose, UA: 150 mg/dL — AB
Ketones, ur: 20 mg/dL — AB
Leukocytes,Ua: NEGATIVE
Nitrite: NEGATIVE
Protein, ur: 100 mg/dL — AB
RBC / HPF: >50 RBC/hpf — ABNORMAL HIGH (ref 0–5)
Specific Gravity, Urine: 1.02 (ref 1.005–1.030)
pH: 5 (ref 5.0–8.0)

## 2022-07-21 LAB — NA AND K (SODIUM & POTASSIUM), RAND UR
Potassium Urine: 44 mmol/L
Sodium, Ur: 124 mmol/L

## 2022-07-21 LAB — TSH: TSH: 3.333 u[IU]/mL (ref 0.350–4.500)

## 2022-07-21 LAB — CBG MONITORING, ED
Glucose-Capillary: 174 mg/dL — ABNORMAL HIGH (ref 70–99)
Glucose-Capillary: 186 mg/dL — ABNORMAL HIGH (ref 70–99)

## 2022-07-21 LAB — OSMOLALITY, URINE: Osmolality, Ur: 629 mOsm/kg (ref 300–900)

## 2022-07-21 LAB — OSMOLALITY: Osmolality: 287 mOsm/kg (ref 275–295)

## 2022-07-21 MED ORDER — OXYCODONE-ACETAMINOPHEN 5-325 MG PO TABS
1.0000 | ORAL_TABLET | Freq: Once | ORAL | Status: AC
  Administered 2022-07-21: 1 via ORAL
  Filled 2022-07-21: qty 1

## 2022-07-21 MED ORDER — PANTOPRAZOLE SODIUM 40 MG PO TBEC
40.0000 mg | DELAYED_RELEASE_TABLET | Freq: Every day | ORAL | Status: DC
  Administered 2022-07-21 – 2022-07-23 (×3): 40 mg via ORAL
  Filled 2022-07-21 (×3): qty 1

## 2022-07-21 MED ORDER — GADOBUTROL 1 MMOL/ML IV SOLN
7.0000 mL | Freq: Once | INTRAVENOUS | Status: AC | PRN
  Administered 2022-07-21: 7 mL via INTRAVENOUS

## 2022-07-21 MED ORDER — INSULIN ASPART 100 UNIT/ML IJ SOLN
0.0000 [IU] | Freq: Three times a day (TID) | INTRAMUSCULAR | Status: DC
  Administered 2022-07-22: 2 [IU] via SUBCUTANEOUS
  Administered 2022-07-22 – 2022-07-23 (×2): 3 [IU] via SUBCUTANEOUS
  Administered 2022-07-23: 2 [IU] via SUBCUTANEOUS
  Filled 2022-07-21 (×2): qty 1

## 2022-07-21 MED ORDER — FLUTICASONE PROPIONATE 50 MCG/ACT NA SUSP: 1.0000 | Freq: Two times a day (BID) | NASAL | Status: DC | PRN

## 2022-07-21 MED ORDER — INSULIN GLARGINE-YFGN 100 UNIT/ML ~~LOC~~ SOLN
10.0000 [IU] | Freq: Every day | SUBCUTANEOUS | Status: DC
  Administered 2022-07-21 – 2022-07-22 (×2): 10 [IU] via SUBCUTANEOUS
  Filled 2022-07-21 (×3): qty 0.1

## 2022-07-21 MED ORDER — ARIPIPRAZOLE 2 MG PO TABS
5.0000 mg | ORAL_TABLET | Freq: Every day | ORAL | Status: DC
  Administered 2022-07-21 – 2022-07-23 (×3): 5 mg via ORAL
  Filled 2022-07-21: qty 3
  Filled 2022-07-21 (×2): qty 1

## 2022-07-21 MED ORDER — BUPROPION HCL ER (XL) 150 MG PO TB24
450.0000 mg | ORAL_TABLET | Freq: Every day | ORAL | Status: DC
  Administered 2022-07-21 – 2022-07-23 (×3): 450 mg via ORAL
  Filled 2022-07-21 (×3): qty 3

## 2022-07-21 MED ORDER — ENOXAPARIN SODIUM 40 MG/0.4ML IJ SOSY
40.0000 mg | PREFILLED_SYRINGE | INTRAMUSCULAR | Status: DC
  Administered 2022-07-21 – 2022-07-22 (×2): 40 mg via SUBCUTANEOUS
  Filled 2022-07-21 (×2): qty 0.4

## 2022-07-21 MED ORDER — LAMOTRIGINE 25 MG PO TABS
150.0000 mg | ORAL_TABLET | Freq: Two times a day (BID) | ORAL | Status: DC
  Administered 2022-07-21 – 2022-07-23 (×4): 150 mg via ORAL
  Filled 2022-07-21 (×2): qty 6
  Filled 2022-07-21 (×2): qty 2

## 2022-07-21 MED ORDER — SODIUM CHLORIDE 0.9 % IV SOLN
1.0000 g | Freq: Once | INTRAVENOUS | Status: AC
  Administered 2022-07-21: 1 g via INTRAVENOUS
  Filled 2022-07-21: qty 10

## 2022-07-21 MED ORDER — TIZANIDINE HCL 2 MG PO TABS
1.0000 mg | ORAL_TABLET | Freq: Every evening | ORAL | Status: DC | PRN
  Administered 2022-07-22: 1 mg via ORAL
  Filled 2022-07-21: qty 1

## 2022-07-21 MED ORDER — PROPRANOLOL HCL 20 MG PO TABS
20.0000 mg | ORAL_TABLET | Freq: Two times a day (BID) | ORAL | Status: DC
  Administered 2022-07-21 – 2022-07-23 (×4): 20 mg via ORAL
  Filled 2022-07-21 (×4): qty 1

## 2022-07-21 MED ORDER — ATORVASTATIN CALCIUM 20 MG PO TABS
20.0000 mg | ORAL_TABLET | Freq: Every day | ORAL | Status: DC
  Administered 2022-07-21 – 2022-07-22 (×2): 20 mg via ORAL
  Filled 2022-07-21 (×2): qty 1

## 2022-07-21 MED ORDER — BUSPIRONE HCL 10 MG PO TABS
10.0000 mg | ORAL_TABLET | Freq: Two times a day (BID) | ORAL | Status: DC
  Administered 2022-07-21 – 2022-07-23 (×4): 10 mg via ORAL
  Filled 2022-07-21: qty 2
  Filled 2022-07-21: qty 1
  Filled 2022-07-21: qty 2
  Filled 2022-07-21: qty 1

## 2022-07-21 MED ORDER — MAGNESIUM HYDROXIDE 400 MG/5ML PO SUSP: 30.0000 mL | Freq: Every day | ORAL | Status: DC | PRN

## 2022-07-21 MED ORDER — TRAZODONE HCL 50 MG PO TABS
25.0000 mg | ORAL_TABLET | Freq: Every evening | ORAL | Status: DC | PRN
  Administered 2022-07-21 – 2022-07-23 (×2): 25 mg via ORAL
  Filled 2022-07-21 (×3): qty 1

## 2022-07-21 MED ORDER — OXYCODONE-ACETAMINOPHEN 5-325 MG PO TABS
1.0000 | ORAL_TABLET | ORAL | Status: DC | PRN
  Administered 2022-07-21 – 2022-07-23 (×3): 1 via ORAL
  Filled 2022-07-21 (×3): qty 1

## 2022-07-21 MED ORDER — ONDANSETRON HCL 4 MG/2ML IJ SOLN: 4.0000 mg | Freq: Four times a day (QID) | INTRAMUSCULAR | Status: DC | PRN

## 2022-07-21 MED ORDER — SODIUM CHLORIDE 0.9 % IV BOLUS
500.0000 mL | Freq: Once | INTRAVENOUS | Status: AC
  Administered 2022-07-21: 500 mL via INTRAVENOUS

## 2022-07-21 MED ORDER — ACETAMINOPHEN 325 MG PO TABS
650.0000 mg | ORAL_TABLET | Freq: Once | ORAL | Status: AC
  Administered 2022-07-21: 650 mg via ORAL
  Filled 2022-07-21: qty 2

## 2022-07-21 MED ORDER — LEVOTHYROXINE SODIUM 112 MCG PO TABS
112.0000 ug | ORAL_TABLET | Freq: Every day | ORAL | Status: DC
Start: 2022-07-22 — End: 2022-07-23
  Administered 2022-07-22 – 2022-07-23 (×2): 112 ug via ORAL
  Filled 2022-07-21 (×2): qty 1

## 2022-07-21 MED ORDER — ONDANSETRON HCL 4 MG PO TABS: 4.0000 mg | ORAL_TABLET | Freq: Four times a day (QID) | ORAL | Status: DC | PRN

## 2022-07-21 MED ORDER — DULOXETINE HCL 30 MG PO CPEP
60.0000 mg | ORAL_CAPSULE | Freq: Two times a day (BID) | ORAL | Status: DC
  Administered 2022-07-21 – 2022-07-23 (×4): 60 mg via ORAL
  Filled 2022-07-21 (×2): qty 1
  Filled 2022-07-21 (×2): qty 2

## 2022-07-21 MED ORDER — SODIUM CHLORIDE 0.9 % IV BOLUS
1000.0000 mL | Freq: Once | INTRAVENOUS | Status: AC
  Administered 2022-07-21: 1000 mL via INTRAVENOUS

## 2022-07-21 MED ORDER — INSULIN ASPART 100 UNIT/ML IJ SOLN: 0.0000 [IU] | Freq: Every day | INTRAMUSCULAR | Status: DC

## 2022-07-21 MED ORDER — ACETAMINOPHEN 650 MG RE SUPP: 650.0000 mg | Freq: Four times a day (QID) | RECTAL | Status: DC | PRN

## 2022-07-21 MED ORDER — SODIUM CHLORIDE 0.9 % IV SOLN
2.0000 g | INTRAVENOUS | Status: DC
  Administered 2022-07-22: 2 g via INTRAVENOUS
  Filled 2022-07-21 (×2): qty 20

## 2022-07-21 MED ORDER — ASPIRIN 81 MG PO CHEW
81.0000 mg | CHEWABLE_TABLET | Freq: Every day | ORAL | Status: DC
  Administered 2022-07-21 – 2022-07-23 (×3): 81 mg via ORAL
  Filled 2022-07-21 (×3): qty 1

## 2022-07-21 MED ORDER — ACETAMINOPHEN 325 MG PO TABS: 650.0000 mg | ORAL_TABLET | Freq: Four times a day (QID) | ORAL | Status: DC | PRN

## 2022-07-21 MED ORDER — MORPHINE SULFATE (PF) 4 MG/ML IV SOLN
4.0000 mg | INTRAVENOUS | Status: DC | PRN
  Administered 2022-07-21 – 2022-07-22 (×4): 4 mg via INTRAVENOUS
  Filled 2022-07-21 (×4): qty 1

## 2022-07-21 MED ORDER — BUPROPION HCL ER (XL) 450 MG PO TB24: 450.0000 mg | ORAL_TABLET | Freq: Every day | ORAL | Status: DC

## 2022-07-21 NOTE — Assessment & Plan Note (Signed)
Further decrease in sodium to 129 with IV fluid.  Labs consistent with isoosmolar hyponatremia.  TSH within normal limit.  Patient is on multiple psychotic medications at home which can be contributory. -Discontinue further IV fluid. -Check CT chest for concern of SIADH -Monitor sodium

## 2022-07-21 NOTE — TOC Initial Note (Signed)
Transition of Care [facility]) - Initial/Assessment Note    Patient Details  Name: Tamara [name] MRN: [id number] Date of Birth: [birth year]  [facility] CM/SW Contact:    Shelbie Hutching, RN Phone Number: 07/21/2022, 4:36 PM  Clinical Narrative:                 Patient being admitted for sepsis.  RNCM met with patient in the emergency department, Corene Cornea with [facility] notified RNCM that patient was in the ED and that they had not been able to see patient for services yet.  Patient is being admitted today.  She has a caregiver with her from [facility], her son pays for private duty care, he lives in [place].  Patient has a [facility] Care Navigator, Neena Rhymes- [phone].  [facility] takes care of scheduling all appointments and home care visits, they also have a nurse that goes out for medication management and vital checks.  Mika was the RN at the home today filling the patient's Pill Box and setting up a bedside commode for her.  TOC will follow, will notify [facility] when patient ready for discharge.    Expected Discharge Plan: [place] Barriers to Discharge: Continued Medical Work up   Patient Goals and CMS Choice   CMS Medicare.gov Compare Post Acute Care list provided to:: Patient Choice offered to / list presented to : Patient  Expected Discharge Plan and Services Expected Discharge Plan: [place]   Discharge Planning Services: CM Consult Post Acute Care Choice: [place] arrangements for the past 2 months: Single Family Home                           HH Arranged: PT, OT [place] Agency: [place] ([facility]) Date HH Agency Contacted: 07/21/22 Time [place]: [place] Representative spoke with at [place]: Corene Cornea  Prior Living Arrangements/Services Living arrangements for the past 2 months: [place] with:: Self Patient language and need for interpreter reviewed:: Yes Do you  feel safe going back to the place where you live?: Yes      Need for Family Participation in Patient Care: Yes (Comment) Care giver support system in place?: Yes (comment) Current home services: DME, Home PT, Home OT, Home RN, Other (comment) ([place]) Criminal Activity/Legal Involvement Pertinent to Current Situation/Hospitalization: No - Comment as needed  Activities of Daily Living      Permission Sought/Granted Permission sought to share information with : Case Manager, Other (comment) Permission granted to share information with : Yes, Verbal Permission Granted  Share Information with NAME: Neena Rhymes  Permission granted to share info w AGENCY: [facility] - Care Management  Permission granted to share info w Relationship: Care Manager  Permission granted to share info w Contact Information: [phone]  Emotional Assessment Appearance:: Appears stated age Attitude/Demeanor/Rapport: Engaged Affect (typically observed): Accepting Orientation: : Oriented to Self, Oriented to Place, Oriented to  Time, Oriented to Situation Alcohol / Substance Use: Not Applicable Psych Involvement: No (comment)  Admission diagnosis:  Sepsis due to gram-negative UTI ([place]) [A41.50, N39.0] Patient Active Problem List   Diagnosis Date Noted   Sepsis due to gram-negative UTI ([place]) 07/21/2022   Uncontrolled type 2 diabetes mellitus with hyperglycemia, with long-term current use of insulin ([place]) 07/21/2022   Anxiety and depression 07/21/2022   GERD without esophagitis 07/21/2022   Hypothyroidism 68/61/6837   Acute metabolic  encephalopathy 07/21/2022   Chronic back pain 07/17/2022   Hyponatremia 07/16/2022   AKI (acute kidney injury) ([place]) 05/08/2022   Anemia 05/08/2022   Chronic kidney disease 05/08/2022   Depression 05/08/2022   Generalized weakness 05/08/2022   Hyperglycemia 05/08/2022   Hypoglycemia 05/08/2022   Cervical spondylosis with myelopathy 07/11/2021    Anxiety, mild 05/16/2021   Loss of memory 05/16/2021   Body mass index (BMI) 31.0-31.9, adult 04/11/2021   Hx of transient ischemic attack (TIA) 04/08/2021   Mild cognitive impairment of uncertain or unknown etiology 04/07/2021   Other spondylosis with myelopathy, thoracic region 04/07/2021   Dysarthria 03/31/2021   Frequent falls 03/30/2021   Gastroesophageal reflux disease 11/30/2020   Diabetes mellitus ([place]) 11/30/2020   Long term (current) use of insulin ([place]) 11/30/2020   Long term (current) use of oral hypoglycemic drugs 11/30/2020   Obesity, unspecified 11/30/2020   Other long term (current) drug therapy 11/30/2020   Unspecified osteoarthritis, unspecified site 11/30/2020   Osteoarthritis 11/30/2020   Polycystic ovarian syndrome 11/30/2020   Pure hypercholesterolemia, unspecified 11/30/2020   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 11/30/2020   Depression, unspecified 11/30/2020   Risk for falls 08/30/2019   Essential hypertension 06/14/2019   Chronic thumb pain, left 12/02/2018   Type 2 diabetes mellitus with diabetic chronic kidney disease ([place]) 12/02/2018   Hypertension associated with type 2 diabetes mellitus ([place]) 12/02/2018   Hyperlipidemia associated with type 2 diabetes mellitus ([place]) 12/02/2018   Chronic kidney disease, stage 3 unspecified ([place]) 09/30/2017   Urinary incontinence in female 08/16/2017   Diabetic neuropathy ([place]) 06/10/2017   Essential tremor 06/10/2017   Fusion of spine of thoracic region 02/15/2017   Sleep apnea 02/05/2017   History of MRSA infection 02/05/2017   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   Status post lumbar spinal fusion 02/05/2017   Mixed action and resting tremor 09/24/2016   Unsteady gait 09/24/2016   Orthostatic hypotension 03/09/2016   History of cervical spinal surgery 03/08/2016   Abnormal cardiovascular stress test 02/20/2016   Spinal stenosis of lumbar  region without neurogenic claudication 01/31/2014   Varicose veins of both lower extremities 02/17/2013   GERD (gastroesophageal reflux disease) 11/04/2012   Obesity 10/11/2012   Mixed hyperlipidemia 06/11/2011   Acquired hypothyroidism 06/11/2011   History of colonic polyps 06/11/2011   History of polycystic ovarian syndrome 06/11/2011   Rotator cuff tear 06/11/2011   Primary hypothyroidism 06/11/2011   PCP:  [facility]:   [facility] [phone] Shari Prows, [place] [facility] [address] AT [address] [place] [address] Phone: [phone] Fax: [phone]     Social Determinants of Health (SDOH) Interventions    Readmission Risk Interventions     No data to display

## 2022-07-21 NOTE — ED Triage Notes (Signed)
Patient to ED for leg cramping. Patient states that this episode starting during the night and woke her up from her sleep.

## 2022-07-21 NOTE — Assessment & Plan Note (Signed)
-   The patient will be placed on supplement coverage with NovoLog. ?- We will continue her basal coverage. ?- We will hold off metformin. ? ?

## 2022-07-21 NOTE — Assessment & Plan Note (Signed)
TSH within normal limit. -Continue new home Synthroid

## 2022-07-21 NOTE — Assessment & Plan Note (Signed)
Blood pressure within goal. - Continue home propranolol

## 2022-07-21 NOTE — Assessment & Plan Note (Signed)
-   We will continue PPI therapy 

## 2022-07-21 NOTE — Assessment & Plan Note (Addendum)
Sepsis is manifested by leukocytosis and heart rate of 97. Concern of UTI.  Urine cultures pending. Procalcitonin negative. -Continue with ceftriaxone -Follow-up urine cultures

## 2022-07-21 NOTE — ED Provider Notes (Signed)
[facility] Provider Note    Event Date/Time   First MD Initiated Contact with Patient 07/21/22 [phone]     (approximate)   History   leg cramps   HPI  Tamara [name] is a 76 y.o. female who presents to the ER for evaluation of bilateral leg cramps.  Describes it as a aching pain worse with movement woke her up from sleep this morning.  Denies any hip pain no low back pain.  Denies any numbness or tingling.  No abdominal pain no chest pain.     Physical Exam   Triage Vital Signs: ED Triage Vitals  Enc Vitals Group     BP      Pulse      Resp      Temp      Temp src      SpO2      Weight      Height      Head Circumference      Peak Flow      Pain Score      Pain Loc      Pain Edu?      Excl. in GC?     Most recent vital signs: Vitals:   07/21/22 0823  BP: (!) 164/79  Pulse: 97  Resp: 18  Temp: 98.2 F (36.8 C)  SpO2: 98%     Constitutional: Alert, chronically ill appearing Eyes: Conjunctivae are normal.  Head: Atraumatic. Nose: No congestion/rhinnorhea. Mouth/Throat: Mucous membranes are moist.   Neck: Painless ROM.  Cardiovascular:   Good peripheral circulation. Respiratory: Normal respiratory effort.  No retractions.  Gastrointestinal: Soft and nontender.  Musculoskeletal:  no deformity Neurologic:  MAE spontaneously. Dysarthric apparently chronic No gross focal neurologic deficits are appreciated.  Skin:  Skin is warm, dry and intact. No rash noted. Psychiatric: Mood and affect are normal. Speech and behavior are normal.    ED Results / Procedures / Treatments   Labs (all labs ordered are listed, but only abnormal results are displayed) Labs Reviewed  CBC WITH DIFFERENTIAL/PLATELET - Abnormal; Notable for the following components:      Result Value   WBC 17.5 (*)    Platelets 435 (*)    Neutro Abs 15.1 (*)    Monocytes Absolute 1.2 (*)    Abs Immature Granulocytes 0.10 (*)    All other components within normal  limits  COMPREHENSIVE METABOLIC PANEL - Abnormal; Notable for the following components:   Sodium 128 (*)    Chloride 94 (*)    CO2 20 (*)    Glucose, Bld 245 (*)    All other components within normal limits  URINALYSIS, ROUTINE W REFLEX MICROSCOPIC - Abnormal; Notable for the following components:   Color, Urine YELLOW (*)    APPearance CLOUDY (*)    Glucose, UA 150 (*)    Hgb urine dipstick LARGE (*)    Ketones, ur 20 (*)    Protein, ur 100 (*)    RBC / HPF >50 (*)    All other components within normal limits  URINE CULTURE  NA AND K (SODIUM & POTASSIUM), RAND UR  OSMOLALITY, URINE  OSMOLALITY  TSH     EKG     RADIOLOGY Please see ED Course for my review and interpretation.  I personally reviewed all radiographic images ordered to evaluate for the above acute complaints and reviewed radiology reports and findings.  These findings were personally discussed with the patient.  Please see medical record  for radiology report.    PROCEDURES:  Critical Care performed: No  Procedures   MEDICATIONS ORDERED IN ED: Medications  morphine (PF) 4 MG/ML injection 4 mg (has no administration in time range)  cefTRIAXone (ROCEPHIN) 1 g in sodium chloride 0.9 % 100 mL IVPB (has no administration in time range)  aspirin chewable tablet 81 mg (has no administration in time range)  oxyCODONE-acetaminophen (PERCOCET/ROXICET) 5-325 MG per tablet 1 tablet (has no administration in time range)  atorvastatin (LIPITOR) tablet 20 mg (has no administration in time range)  propranolol (INDERAL) tablet 20 mg (has no administration in time range)  ARIPiprazole (ABILIFY) tablet 5 mg (has no administration in time range)  DULoxetine (CYMBALTA) DR capsule 60 mg (has no administration in time range)  busPIRone (BUSPAR) tablet 10 mg (has no administration in time range)  buPROPion HCl ER (XL) TB24 450 mg (has no administration in time range)  insulin glargine-yfgn (SEMGLEE) injection 10 Units (has  no administration in time range)  levothyroxine (SYNTHROID) tablet 112 mcg (has no administration in time range)  pantoprazole (PROTONIX) EC tablet 40 mg (has no administration in time range)  lamoTRIgine (LAMICTAL) tablet 150 mg (has no administration in time range)  tiZANidine (ZANAFLEX) tablet 1 mg (has no administration in time range)  fluticasone (FLONASE) 50 MCG/ACT nasal spray 1 spray (has no administration in time range)  enoxaparin (LOVENOX) injection 40 mg (has no administration in time range)  cefTRIAXone (ROCEPHIN) 2 g in sodium chloride 0.9 % 100 mL IVPB (has no administration in time range)  acetaminophen (TYLENOL) tablet 650 mg (has no administration in time range)    Or  acetaminophen (TYLENOL) suppository 650 mg (has no administration in time range)  traZODone (DESYREL) tablet 25 mg (has no administration in time range)  magnesium hydroxide (MILK OF MAGNESIA) suspension 30 mL (has no administration in time range)  ondansetron (ZOFRAN) tablet 4 mg (has no administration in time range)    Or  ondansetron (ZOFRAN) injection 4 mg (has no administration in time range)  acetaminophen (TYLENOL) tablet 650 mg (650 mg Oral Given 07/21/22 0900)  oxyCODONE-acetaminophen (PERCOCET/ROXICET) 5-325 MG per tablet 1 tablet (1 tablet Oral Given 07/21/22 0859)  sodium chloride 0.9 % bolus 1,000 mL (0 mLs Intravenous Stopped 07/21/22 1044)  sodium chloride 0.9 % bolus 500 mL (0 mLs Intravenous Stopped 07/21/22 1310)  gadobutrol (GADAVIST) 1 MMOL/ML injection 7 mL (7 mLs Intravenous Contrast Given 07/21/22 1146)     IMPRESSION / MDM / ASSESSMENT AND PLAN / ED COURSE  I reviewed the triage vital signs and the nursing notes.                              Differential diagnosis includes, but is not limited to, Dehydration, sepsis, pna, uti, hypoglycemia, cva, drug effect, withdrawal, encephalitis  Patient presented to the ER for evaluation of symptoms as described above.  This presenting complaint  could reflect a potentially life-threatening illness therefore the patient will be placed on continuous pulse oximetry and telemetry for monitoring.  Laboratory evaluation will be sent to evaluate for the above complaints.    Will order IV fluids as the patient does appear dehydrated imaging will be ordered we will check urine given report of hematuria.    Clinical Course as of 07/21/22 1410  Tue Jul 21, 2022  1194 Patient's gerontologist here at bedside concerning patient's deterioration and pain.  Reportedly did have some hematuria last night no  history of kidney stones. [PR]  1014 CT imaging on my review and interpretation does not show any evidence of stone or AAA.  Will await formal radiology report. [PR]  1348 CT imaging without acute abnormality.  MRI was ordered given her weakness back pain and history.  Somewhat limited due to motion artifact but does show evidence of superior endplate compression fracture subacute which likely cause of the patient's pain.  She does have white count urine is cloudy with large red cells white cells probable cystitis we will send for urine culture.  Given her weakness and altered mental status in the setting of hyponatremia 128 quiring IV fluids we will order IV Rocephin and discussed case with hospitalist for admission. [PR]    Clinical Course User Index [PR] Willy Eddy, MD      FINAL CLINICAL IMPRESSION(S) / ED DIAGNOSES   Final diagnoses:  Acute cystitis with hematuria  Lumbar compression fracture, closed, initial encounter (HCC)  Altered mental status, unspecified altered mental status type     Rx / DC Orders   ED Discharge Orders     None        Note:  This document was prepared using Dragon voice recognition software and may include unintentional dictation errors.    Willy Eddy, MD 07/21/22 1410

## 2022-07-21 NOTE — Assessment & Plan Note (Addendum)
Most likely secondary to UTI, also has mild hyponatremia - She has been having associated leg cramps and recurrent falls. - PT consult-recommending home health PT

## 2022-07-21 NOTE — Assessment & Plan Note (Deleted)
-   The patient will be placed on supplement coverage with NovoLog. ?- We will continue her basal coverage. ?- We will hold off metformin. ? ?

## 2022-07-21 NOTE — Assessment & Plan Note (Signed)
Patient is on multiple psychotic medications. -Continue home meds -Patient need to have a close follow-up with her psychiatrist for a good med rec and further management

## 2022-07-21 NOTE — ED Notes (Signed)
Pt informed she is on a fluid restriction '

## 2022-07-21 NOTE — H&P (Addendum)
[place]   PATIENT NAME: Tamara [name]    MR#:  [id number]  DATE OF BIRTH:  [birth year]  DATE OF ADMISSION:  07/21/2022  PRIMARY CARE PHYSICIAN: [place], [facility]   Patient is coming from: Home  REQUESTING/REFERRING PHYSICIAN: Merlyn Lot, MD  CHIEF COMPLAINT:   Chief Complaint  Patient presents with   leg cramps    HISTORY OF PRESENT ILLNESS:  Tamara [name] is a 76 y.o. female with medical history significant for type 2 diabetes mellitus, GERD, hypertension, hypothyroidism and depression, noted to emergency room with acute onset of altered mental status and frequent falls for couple visits on 8/17 and 8/20 and today has been having worsening altered mental status with mild confusion as well as low back pain surrounding her hips and acid cheated leg cramps.  No reported nausea or vomiting or diarrhea.  No reported abdominal pain or melena or bright red blood per rectum.  No fever or chills.  No chest pain or palpitations.  No dyspnea cough or wheezing.  She denied any significant dysuria, oliguria, urinary urgency or flank pain.  She admits to drinking significant mount of water that may cause urinary frequency.  She stated that she lost 55 pounds in the last 4 months and she has been trying to do that.  ED Course: Upon presenting to the emergency room, BP was 164/79 with heart rate of 97, otherwise normal vital signs.  Labs revealed worsening hyponatremia with a sodium of 128 down from 132 a couple days ago, chloride 94 compared to 96 then and CO2 of 20 compared to 23 then.  CBC showed leukocytosis 17.5 up from 10.7 with neutrophilia.  Imaging: Portable chest ray showed clear lungs with no acute cardiopulmonary disease. CT renal stone revealed: IMPRESSION: 1. No acute intra-abdominal or pelvic pathology. 2. Large stool burden suggesting constipation. 3. Midline lower anterior abdominal wall fat containing hernia, similar to prior. 4. Aortic  atherosclerosis.  The patient was given a gram of IV Rocephin, 650 mg p.o. Tylenol, p.o. Percocet, 1.5 L bolus of IV normal saline and 4 mg of IV morphine sulfate.  She will be admitted to a medical telemetry bed for further evaluation and management. PAST MEDICAL HISTORY:   Past Medical History:  Diagnosis Date   Anemia    Chronic kidney disease    Depression    Diabetes mellitus without complication (HCC)    GERD (gastroesophageal reflux disease)    Hypertension    Hypothyroidism     PAST SURGICAL HISTORY:   Past Surgical History:  Procedure Laterality Date   ACHILLES TENDON REPAIR     BACK SURGERY     HERNIA REPAIR     SHOULDER SURGERY      SOCIAL HISTORY:   Social History   Tobacco Use   Smoking status: Never   Smokeless tobacco: Never  Substance Use Topics   Alcohol use: No    FAMILY HISTORY:   Family History  Adopted: Yes  Problem Relation Age of Onset   Cancer Mother    Diabetes Mother     DRUG ALLERGIES:   Allergies  Allergen Reactions   Ace Inhibitors Cough    REVIEW OF SYSTEMS:   ROS As per history of present illness. All pertinent systems were reviewed above. Constitutional, HEENT, cardiovascular, respiratory, GI, GU, musculoskeletal, neuro, psychiatric, endocrine, integumentary and hematologic systems were reviewed and are otherwise negative/unremarkable except for positive findings mentioned above in the HPI.   MEDICATIONS AT HOME:  Prior to Admission medications   Medication Sig Start Date End Date Taking? Authorizing Provider  acetaminophen (TYLENOL) 325 MG tablet Take 650 mg by mouth every 6 (six) hours as needed.    [provider]  ARIPiprazole (ABILIFY) 5 MG tablet Take 5 mg by mouth at bedtime. 07/15/22   [provider]  aspirin 81 MG chewable tablet Chew 81 mg by mouth daily.    [provider]  atorvastatin (LIPITOR) 20 MG tablet Take 20 mg by mouth at bedtime. 04/14/22   [provider]   blood glucose meter kit and supplies KIT Dispense based on patient and insurance preference. Use up to four times daily as directed. 05/10/22   Little Ishikawa, MD  BuPROPion HCl ER, XL, 450 MG TB24 Take 450 mg by mouth daily.    [provider]  busPIRone (BUSPAR) 10 MG tablet Take 10 mg by mouth 2 (two) times daily. 03/19/22   [provider]  diclofenac Sodium (VOLTAREN) 1 % GEL Apply 2 g topically 4 (four) times daily. 02/27/22 02/27/23  [provider]  Dulaglutide (TRULICITY) 1.5 LK/4.4WN SOPN Inject 0.5 mLs into the skin once a week.    [provider]  DULoxetine (CYMBALTA) 60 MG capsule Take 60 mg by mouth 2 (two) times daily. 12/26/18   [provider]  esomeprazole (NEXIUM) 20 MG capsule Take 20 mg by mouth daily. To prevent coughing after eating. 04/30/22   [provider]  fluticasone (FLONASE) 50 MCG/ACT nasal spray Place 1 spray into both nostrils 2 (two) times daily. 02/13/22   [provider]  insulin glargine (LANTUS) 100 UNIT/ML Solostar Pen Inject 10 Units into the skin daily. 05/10/22   Little Ishikawa, MD  lamoTRIgine (LAMICTAL) 150 MG tablet Take 150 mg by mouth 2 (two) times daily. 05/06/22   [provider]  levothyroxine (SYNTHROID) 112 MCG tablet Take 112 mcg by mouth daily. 03/15/22   [provider]  metFORMIN (GLUCOPHAGE) 500 MG tablet Take 500 mg by mouth 2 (two) times daily. 02/01/22   [provider]  oxyCODONE-acetaminophen (PERCOCET) 5-325 MG tablet Take 1 tablet by mouth every 4 (four) hours as needed for severe pain. 07/19/22 07/19/23  Nena Polio, MD  propranolol (INDERAL) 40 MG tablet Take 0.5 tablets (20 mg total) by mouth 2 (two) times daily. 05/10/22 07/16/22  Little Ishikawa, MD  tiZANidine (ZANAFLEX) 2 MG tablet Take 0.5 tablets (1 mg total) by mouth at bedtime as needed. 05/10/22   Little Ishikawa, MD  valsartan (DIOVAN) 80 MG tablet Take 80 mg by mouth daily.     [provider]  methylphenidate (RITALIN) 10 MG tablet  08/15/18 09/21/19  [provider]      VITAL SIGNS:  Blood pressure (!) 158/70, pulse 90, temperature 98 F (36.7 C), temperature source Oral, resp. rate 18, height '5\' 7"'  (1.702 m), weight 72.6 kg, SpO2 98 %.  PHYSICAL EXAMINATION:  Physical Exam  GENERAL:  76 y.o.-year-old Caucasian female patient lying in the bed with no acute distress.  EYES: Pupils equal, round, reactive to light and accommodation. No scleral icterus. Extraocular muscles intact.  HEENT: Head atraumatic, normocephalic. Oropharynx and nasopharynx clear.  NECK:  Supple, no jugular venous distention. No thyroid enlargement, no tenderness.  LUNGS: Normal breath sounds bilaterally, no wheezing, rales,rhonchi or crepitation. No use of accessory muscles of respiration.  CARDIOVASCULAR: Regular rate and rhythm, S1, S2 normal. No murmurs, rubs, or gallops.  ABDOMEN: Soft, nondistended, nontender. Bowel  sounds present. No organomegaly or mass.  EXTREMITIES: No pedal edema, cyanosis, or clubbing.  NEUROLOGIC: Cranial nerves II through XII are intact. Muscle strength 5/5 in all extremities. Sensation intact. Gait not checked.  PSYCHIATRIC: The patient is alert and oriented x 3.  Normal affect and good eye contact. SKIN: No obvious rash, lesion, or ulcer.   LABORATORY PANEL:   CBC Recent Labs  Lab 07/21/22 0838  WBC 17.5*  HGB 12.3  HCT 37.4  PLT 435*   ------------------------------------------------------------------------------------------------------------------  Chemistries  Recent Labs  Lab 07/21/22 0838  NA 128*  K 3.9  CL 94*  CO2 20*  GLUCOSE 245*  BUN 22  CREATININE 0.90  CALCIUM 9.3  AST 31  ALT 25  ALKPHOS 88  BILITOT 1.1   ------------------------------------------------------------------------------------------------------------------  Cardiac Enzymes No results for input(s): "TROPONINI" in the last 168  hours. ------------------------------------------------------------------------------------------------------------------  RADIOLOGY:  MR Lumbar Spine W Wo Contrast  Result Date: 07/21/2022 CLINICAL DATA:  Low back pain. Cauda equina syndrome suspected. Multiple recent falls. Previous lumbar fusion. Concern for abscess. EXAM: MRI LUMBAR SPINE WITHOUT AND WITH CONTRAST TECHNIQUE: Multiplanar and multiecho pulse sequences of the lumbar spine were obtained without and with intravenous contrast. CONTRAST:  27m GADAVIST GADOBUTROL 1 MMOL/ML IV SOLN COMPARISON:  Abdominopelvic CT 07/21/2022 and 05/18/2017. No previous dedicated lumbar spine imaging available. FINDINGS: Despite efforts by the technologist and patient, at least moderate motion artifact is present on today's exam and could not be eliminated. This reduces exam sensitivity and specificity. The motion progresses throughout the examination, and the postcontrast images are nearly nondiagnostic. Segmentation: Conventional anatomy assumed, with the last open disc space designated L5-S1.Concordant with previous imaging. Alignment: Grade 1 retrolisthesis at T12-L1, L1-2 and L2-3. Mild convex left scoliosis. Vertebrae: Status post posterior lumbar and interbody fusion from L3 through S1 with solid interbody fusion as correlated with previous CT. Anterior fusion has also been performed at L5-S1. There is susceptibility artifact related to the spinal hardware. There is a fracture involving the inferior endplate of L1 which appears subacute on CT, with associated marrow and surrounding soft tissue edema. There is also marrow edema within the superior endplate of L2. No gross endplate destruction or discal hyperintensity to strongly suggest discitis/osteomyelitis. There is a healed inferior endplate compression fracture at T12 without associated abnormal marrow signal. Conus medullaris: Extends to the L1-2 level and appears normal. Paraspinal and other soft tissues:  Anteriorly displaced bony fragments associated with the inferior endplate compression fracture at L1 with surrounding soft tissue thickening. No focal fluid collection. Disc levels: The axial images are significantly motion degraded. T11-12: Normal interspace. T12-L1: Advanced spondylosis with posterior osteophytes indenting the ventral surface of the distal thoracic cord. Mild foraminal narrowing bilaterally. L1-2: As above, subacute appearing inferior endplate compression fracture at L1 with associated endplate edema, but no gross endplate destruction or discal hyperintensity to strongly suggest infection. Posterior osteophytes, facet and ligamentous hypertrophy contribute to moderate spinal stenosis and moderate foraminal narrowing bilaterally. L2-3: Chronic adjacent segment disease with loss of disc height, annular disc bulging and posterior osteophytes. Resulting moderate multifactorial spinal stenosis with mild to moderate lateral recess and foraminal narrowing bilaterally. L3-4: No significant spinal stenosis or nerve root encroachment post PLIF. L4-5: Chronic right-greater-than-left foraminal narrowing due to facet hypertrophy post PLIF. The spinal canal is decompressed. L5-S1: Mild chronic foraminal narrowing bilaterally post PLIF. Decompressed spinal canal. IMPRESSION: 1. Study is significantly motion degraded, especially the axial and postcontrast images. 2. Subacute appearing inferior endplate compression fracture at L1  with associated bone marrow edema and enhancement, likely reactive. No endplate destruction, discal hyperintensity or surrounding fluid collection to strongly suggest discitis/osteomyelitis. If that remains a clinical concern, short-term follow-up imaging may be warranted. Without sedation, dedicated lumbar spine CT may be preferable to repeat MRI. 3. Moderate multifactorial spinal stenosis at L1-2 with moderate foraminal narrowing bilaterally. 4. 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 5. Status post L3 through S1 fusion without significant residual spinal stenosis. Electronically Signed   By: Richardean Sale M.D.   On: 07/21/2022 12:11   DG Chest Portable 1 View  Result Date: 07/21/2022 CLINICAL DATA:  Weakness and leg cramps EXAM: PORTABLE CHEST 1 VIEW COMPARISON:  12/17/2017 FINDINGS: Heart size and vascularity normal. Lungs clear without infiltrate or effusion Unilateral pedicle screw fusion midthoracic spine on the left with apparent corpectomy. Anterior and posterior fusion cervical spine. IMPRESSION: No active disease. Electronically Signed   By: Franchot Gallo M.D.   On: 07/21/2022 10:34   CT Renal Stone Study  Result Date: 07/21/2022 CLINICAL DATA:  Patient to ED for leg cramping. Patient states that this episode starting during the night and woke her up from her sleep. EXAM: CT ABDOMEN AND PELVIS WITHOUT CONTRAST TECHNIQUE: Multidetector CT imaging of the abdomen and pelvis was performed following the standard protocol without IV contrast. RADIATION DOSE REDUCTION: This exam was performed according to the departmental dose-optimization program which includes automated exposure control, adjustment of the mA and/or kV according to patient size and/or use of iterative reconstruction technique. COMPARISON:  CT abdomen pelvis 05/18/2017 FINDINGS: Lower chest: Small hiatal hernia. Evaluation of the abdominal viscera limited by lack of IV contrast. Hepatobiliary: No focal liver abnormality is seen. Normal appearance of the gallbladder. Pancreas: Unremarkable. No surrounding inflammatory changes. Spleen: Normal in size without focal abnormality. Adrenals/Urinary Tract: Adrenal glands are unremarkable. No renal calculi or hydronephrosis. There is a cyst in the posterior right kidney, evaluation somewhat limited secondary to streak artifact from patient's adjacent spinal hardware. There is a cyst in the outer left kidney. Urinary bladder is  unremarkable. Stomach/Bowel: Stomach is within normal limits. Appendix appears normal. No evidence of bowel wall thickening, distention, or inflammatory changes. Large stool burden. Vascular/Lymphatic: Aortic atherosclerosis. No enlarged abdominal or pelvic lymph nodes. Reproductive: Uterus and bilateral adnexa are unremarkable. Other: Similar to prior there is a midline fat containing hernia inferior to the umbilicus. No evidence of incarcerated bowel strangulation. The hernia measures approximately 5.9 x 3.1 cm. Musculoskeletal: Spinal fusion hardware spanning L3-S1. Hardware appears intact. There are multilevel thoracic and lumbar spine degenerative changes. No acute finding. IMPRESSION: 1. No acute intra-abdominal or pelvic pathology. 2. Large stool burden suggesting constipation. 3. Midline lower anterior abdominal wall fat containing hernia, similar to prior. 4. Aortic atherosclerosis. Aortic Atherosclerosis (ICD10-I70.0). Electronically Signed   By: Audie Pinto M.D.   On: 07/21/2022 10:24      IMPRESSION AND PLAN:  Assessment and Plan: * Sepsis due to gram-negative UTI [facility]) - Patient will be admitted in medical telemetry bed. - Sepsis is manifested by leukocytosis and heart rate of 97. -We will continue antibiotic therapy with IV Rocephin. - We will follow urine and blood cultures.  Hyponatremia - This is likely hypovolemic. - We will obtain hyponatremia work-up. - Should be hydrated with IV normal saline and will follow sodium levels.  Acute metabolic encephalopathy - This like secondary to her sepsis and hyponatremia. - She has been having associated leg cramps and recurrent falls. - PT  consult will be obtained. - We will monitor mental status with current management while she is here. -We will monitor leg cramps with management of her hyponatremia and place on as needed Flexeril.  Uncontrolled type 2 diabetes mellitus with hyperglycemia, with long-term current use of insulin  ([place]) - The patient will be placed on supplement coverage with NovoLog. - We will continue her basal coverage. - We will hold off metformin.  Essential hypertension - We will get her antihypertensives.  Hypothyroidism - We will continue Synthroid. - TSH will be checked.  GERD without esophagitis - We will continue PPI therapy.  Anxiety and depression - We will continue BuSpar XL and Cymbalta.   DVT prophylaxis: Lovenox.  Advanced Care Planning:  Code Status: full code.  Family Communication:  The plan of care was discussed in details with the patient (and family). I answered all questions. The patient agreed to proceed with the above mentioned plan. Further management will depend upon hospital course. Disposition Plan: Back to previous home environment Consults called: none.  All the records are reviewed and case discussed with ED provider.  Status is: Inpatient  At the time of the admission, it appears that the appropriate admission status for this patient is inpatient.  This is judged to be reasonable and necessary in order to provide the required intensity of service to ensure the patient's safety given the presenting symptoms, physical exam findings and initial radiographic and laboratory data in the context of comorbid conditions.  The patient requires inpatient status due to high intensity of service, high risk of further deterioration and high frequency of surveillance required.  I certify that at the time of admission, it is my clinical judgment that the patient will require inpatient hospital care extending more than 2 midnights.                            Dispo: The patient is from: Home              Anticipated d/c is to: Home              Patient currently is not medically stable to d/c.              Difficult to place patient: No  Christel Mormon M.D on 07/21/2022 at 3:17 PM  [facility]   From 7 PM-7 AM, contact night-coverage [email or web address]  CC: Primary  care physician; [facility]

## 2022-07-22 ENCOUNTER — Inpatient Hospital Stay: Payer: Medicare Other

## 2022-07-22 DIAGNOSIS — A4150 Gram-negative sepsis, unspecified: Secondary | ICD-10-CM | POA: Diagnosis not present

## 2022-07-22 DIAGNOSIS — N390 Urinary tract infection, site not specified: Secondary | ICD-10-CM | POA: Diagnosis not present

## 2022-07-22 LAB — PROTIME-INR
INR: 1.1 (ref 0.8–1.2)
Prothrombin Time: 13.9 seconds (ref 11.4–15.2)

## 2022-07-22 LAB — BASIC METABOLIC PANEL
Anion gap: 8 (ref 5–15)
BUN: 17 mg/dL (ref 8–23)
CO2: 23 mmol/L (ref 22–32)
Calcium: 8.7 mg/dL — ABNORMAL LOW (ref 8.9–10.3)
Chloride: 98 mmol/L (ref 98–111)
Creatinine, Ser: 0.87 mg/dL (ref 0.44–1.00)
GFR, Estimated: >60 mL/min (ref >60)
Glucose, Bld: 177 mg/dL — ABNORMAL HIGH (ref 70–99)
Potassium: 3.9 mmol/L (ref 3.5–5.1)
Sodium: 129 mmol/L — ABNORMAL LOW (ref 135–145)

## 2022-07-22 LAB — CBC
HCT: 31.6 % — ABNORMAL LOW (ref 36.0–46.0)
Hemoglobin: 9.8 g/dL — ABNORMAL LOW (ref 12.0–15.0)
MCH: 26.6 pg (ref 26.0–34.0)
MCHC: 31 g/dL (ref 30.0–36.0)
MCV: 85.6 fL (ref 80.0–100.0)
Platelets: 376 10*3/uL (ref 150–400)
RBC: 3.69 MIL/uL — ABNORMAL LOW (ref 3.87–5.11)
RDW: 14.2 % (ref 11.5–15.5)
WBC: 10.3 10*3/uL (ref 4.0–10.5)
nRBC: 0 % (ref 0.0–0.2)

## 2022-07-22 LAB — URINE CULTURE: Culture: NO GROWTH

## 2022-07-22 LAB — CORTISOL-AM, BLOOD: Cortisol - AM: 26.5 ug/dL — ABNORMAL HIGH (ref 6.7–22.6)

## 2022-07-22 LAB — GLUCOSE, CAPILLARY
Glucose-Capillary: 126 mg/dL — ABNORMAL HIGH (ref 70–99)
Glucose-Capillary: 89 mg/dL (ref 70–99)

## 2022-07-22 LAB — PROCALCITONIN: Procalcitonin: <0.1 ng/mL

## 2022-07-22 LAB — CBG MONITORING, ED: Glucose-Capillary: 162 mg/dL — ABNORMAL HIGH (ref 70–99)

## 2022-07-22 MED ORDER — IOHEXOL 300 MG/ML  SOLN
75.0000 mL | Freq: Once | INTRAMUSCULAR | Status: AC | PRN
  Administered 2022-07-22: 75 mL via INTRAVENOUS

## 2022-07-22 NOTE — ED Notes (Signed)
Informed RN bed assigned 

## 2022-07-22 NOTE — Evaluation (Signed)
Physical Therapy Evaluation Patient Details Name: Tamara [name] MRN: [id number] DOB: 06-19-1946 Today's Date: 07/22/2022  History of Present Illness  Tamara [name] is a 76 y.o. female with medical history significant for type 2 diabetes mellitus, GERD, hypertension, hypothyroidism and depression, noted to emergency room with acute onset of altered mental status and frequent falls for couple visits on 8/17 and 8/20 and today has been having worsening altered mental status with mild confusion.   Clinical Impression  Pt admitted with above diagnosis. Received upright in bed agreeable to PT services. PCA manager present in room. Pt remains with significant care from PCA's 24/7 3 days/week and 12 hours the rest of the week to assist in ADL's/IADL's and supervision for mobility. Pt mod-I with 3WW at baseline in home.   To date pt limited in full participation due to chronic lower back and L hip pain. Reports better than last admission. PT requiring minguard for supine to sit and SBA for static sitting balance. Pt able to stand minguard to RW with safe hand placement and slowly ambulate ~8' with minguard and antalgic gait on LLE. Pt reports worsening LBP and hip pain leading pt to return to supine in bed with modA at LE's. Pt appears close to baseline except is limited in distance due to pain. Pt with significant falls history and educated on benefits of RW for added stability compared to 3WW and 4WW and encouraging pt to maintain 24/7 supervision. All needs in reach. Pt currently with functional limitations due to the deficits listed below (see PT Problem List). Pt will benefit from skilled PT to increase their independence and safety with mobility to allow discharge to the venue listed below.     Recommendations for follow up therapy are one component of a multi-disciplinary discharge planning process, led by the attending physician.  Recommendations may be updated based on patient status, additional  functional criteria and insurance authorization.  Follow Up Recommendations Home health PT Can patient physically be transported by private vehicle: Yes    Assistance Recommended at Discharge Frequent or constant Supervision/Assistance  Patient can return home with the following  A lot of help with walking and/or transfers;A lot of help with bathing/dressing/bathroom;Assistance with cooking/housework;Assist for transportation;Help with stairs or ramp for entrance;Direct supervision/assist for medications management    Equipment Recommendations Rolling walker (2 wheels)  Recommendations for Other Services       Functional Status Assessment Patient has had a recent decline in their functional status and demonstrates the ability to make significant improvements in function in a reasonable and predictable amount of time.     Precautions / Restrictions Precautions Precautions: Fall Restrictions Weight Bearing Restrictions: No      Mobility  Bed Mobility Overal bed mobility: Needs Assistance Bed Mobility: Supine to Sit, Sit to Supine   Sidelying to sit: Min guard, HOB elevated Supine to sit: Mod assist     General bed mobility comments: ModA at LE's to return to supine. able to scoot superiorly to [facility] with bridging Patient Response: Cooperative  Transfers Overall transfer level: Needs assistance Equipment used: Rolling walker (2 wheels) Transfers: Sit to/from Stand Sit to Stand: Min guard           General transfer comment: stands from ED cot without physical assist with correct hand placement    Ambulation/Gait Ambulation/Gait assistance: Min guard Gait Distance (Feet): 8 Feet Assistive device: Rolling walker (2 wheels) Gait Pattern/deviations: Step-to pattern, Narrow base of support, Decreased step length - right, Decreased step length -  left Gait velocity: slowed and cautious. Limited by pain        Stairs            Wheelchair Mobility    Modified  Rankin (Stroke Patients Only)       Balance Overall balance assessment: Needs assistance Sitting-balance support: Bilateral upper extremity supported, Feet unsupported Sitting balance-Leahy Scale: Fair     Standing balance support: Bilateral upper extremity supported, During functional activity Standing balance-Leahy Scale: Fair Standing balance comment: maintains standing with RW                             Pertinent Vitals/Pain Pain Assessment Pain Assessment: Faces Faces Pain Scale: Hurts little more Pain Location: low back Pain Descriptors / Indicators: Cramping, Tender, Grimacing, Guarding Pain Intervention(s): Limited activity within patient's tolerance, Repositioned, Monitored during session    [place] expects to be discharged to:: Private residence Living Arrangements: Alone Available Help at Discharge: Personal care attendant Type of Home: House Home Access: Stairs to enter Entrance Stairs-Rails: Left Entrance Stairs-Number of Steps: 4 with L railing from garage; has ramp in back   Home Layout: One level Home Equipment: Grab bars - tub/shower;Rollator (4 wheels);Shower seat;Wheelchair - manual      Prior Function Prior Level of Function : Needs assist             Mobility Comments: Utilizes 3WW at home ADLs Comments: Control and instrumentation engineer in room reports pt has 24/7 care 3 days a week and 12 hours a day the remainder of the week.     Hand Dominance   Dominant Hand: Right    Extremity/Trunk Assessment   Upper Extremity Assessment Upper Extremity Assessment: Generalized weakness    Lower Extremity Assessment Lower Extremity Assessment: Generalized weakness       Communication   Communication: Expressive difficulties  Cognition Arousal/Alertness: Awake/alert Behavior During Therapy: WFL for tasks assessed/performed Overall Cognitive Status: History of cognitive impairments - at baseline                                  General Comments: pleasant and cooperative, reports accurate home lay out info as documented in EMR.        General Comments      Exercises Other Exercises Other Exercises: Role of PT in acute setting, d/c recs, use of RW for decreased falls risk   Assessment/Plan    PT Assessment Patient needs continued PT services  PT Problem List Decreased strength;Decreased activity tolerance;Decreased balance;Decreased mobility;Decreased knowledge of use of DME;Decreased knowledge of precautions;Pain       PT Treatment Interventions DME instruction;Gait training;Functional mobility training;Therapeutic activities;Therapeutic exercise;Balance training;Patient/family education;Stair training;Neuromuscular re-education    PT Goals (Current goals can be found in the Care Plan section)  Acute Rehab PT Goals Patient Stated Goal: improve pain with mobility PT Goal Formulation: With patient Time For Goal Achievement: 08/05/22 Potential to Achieve Goals: Fair    Frequency Min 2X/week     Co-evaluation               AM-PAC PT "6 Clicks" Mobility  Outcome Measure Help needed turning from your back to your side while in a flat bed without using bedrails?: A Little Help needed moving from lying on your back to sitting on the side of a flat bed without using bedrails?: A Lot Help needed moving to and from  a bed to a chair (including a wheelchair)?: A Little Help needed standing up from a chair using your arms (e.g., wheelchair or bedside chair)?: A Little Help needed to walk in hospital room?: A Lot Help needed climbing 3-5 steps with a railing? : A Lot 6 Click Score: 15    End of Session Equipment Utilized During Treatment: Gait belt Activity Tolerance: Patient limited by pain Patient left: in bed;with family/visitor present (Government social research officer present) Nurse Communication: Mobility status PT Visit Diagnosis: Unsteadiness on feet (R26.81);Other abnormalities of gait and  mobility (R26.89);Repeated falls (R29.6);Muscle weakness (generalized) (M62.81);History of falling (Z91.81);Pain Pain - Right/Left: Left Pain - part of body: Hip (low back)    Time: 2549-8264 PT Time Calculation (min) (ACUTE ONLY): 19 min   Charges:   PT Evaluation $PT Eval Moderate Complexity: 1 Mod         Anokhi Shannon M. Fairly IV, PT, DPT Physical Therapist- [place]  [facility]  07/22/2022, 1:03 PM

## 2022-07-22 NOTE — Progress Notes (Signed)
Progress Note   Patient: Tamara [name] KWI:[id number] DOB: 20-Sep-1946 DOA: 07/21/2022     1 DOS: the patient was seen and examined on 07/22/2022   Brief hospital course: Taken from H&P.  Tamara [name] is a 76 y.o. female with medical history significant for type 2 diabetes mellitus, GERD, hypertension, hypothyroidism and depression, noted to emergency room with acute onset of altered mental status and frequent falls for couple visits on 8/17 and 8/20 and today has been having worsening altered mental status with mild confusion as well as low back pain surrounding her hips and acid cheated leg cramps.  No reported nausea or vomiting or diarrhea.  No reported abdominal pain or melena or bright red blood per rectum.  No fever or chills.  No chest pain or palpitations.  No dyspnea cough or wheezing.  She denied any significant dysuria, oliguria, urinary urgency or flank pain.  She admits to drinking significant mount of water that may cause urinary frequency.  She stated that she lost 55 pounds in the last 4 months and she has been trying to do that.   ED Course: Upon presenting to the emergency room, BP was 164/79 with heart rate of 97, otherwise normal vital signs.  Labs revealed worsening hyponatremia with a sodium of 128 down from 132 a couple days ago, chloride 94 compared to 96 then and CO2 of 20 compared to 23 then.  CBC showed leukocytosis 17.5 up from 10.7 with neutrophilia. UA with microscopic hematuria and mildly elevated WBC.   Imaging: Portable chest ray showed clear lungs with no acute cardiopulmonary disease. CT renal stone revealed: IMPRESSION: 1. No acute intra-abdominal or pelvic pathology. 2. Large stool burden suggesting constipation. 3. Midline lower anterior abdominal wall fat containing hernia, similar to prior. 4. Aortic atherosclerosis.  She was started on ceftriaxone for concern of UTI.  Urine cultures pending.  8/23: Leukocytosis resolved, all cell lines decreased, some  dilutional effect. Procalcitonin negative, further decrease in sodium to 129. Patient received IV fluid in ED. hyponatremia labs consistent with isoosmolar hyponatremia.  TSH within normal limit.  No recent procedures. Ordered CT chest for concern of SIADH.  Hold more IV fluid.   Assessment and Plan: * Sepsis due to gram-negative UTI (HCC)  Sepsis is manifested by leukocytosis and heart rate of 97. Concern of UTI.  Urine cultures pending. Procalcitonin negative. -Continue with ceftriaxone -Follow-up urine cultures  Hyponatremia Further decrease in sodium to 129 with IV fluid.  Labs consistent with isoosmolar hyponatremia.  TSH within normal limit.  Patient is on multiple psychotic medications at home which can be contributory. -Discontinue further IV fluid. -Check CT chest for concern of SIADH -Monitor sodium  Acute metabolic encephalopathy Most likely secondary to UTI, also has mild hyponatremia - She has been having associated leg cramps and recurrent falls. - PT consult-recommending home health PT   Essential hypertension Blood pressure within goal. - Continue home propranolol  Uncontrolled type 2 diabetes mellitus with hyperglycemia, with long-term current use of insulin (HCC) - The patient will be placed on supplement coverage with NovoLog. - We will continue her basal coverage. - We will hold off metformin.  Anxiety and depression Patient is on multiple psychotic medications. -Continue home meds -Patient need to have a close follow-up with her psychiatrist for a good med rec and further management  GERD without esophagitis - We will continue PPI therapy.  Hypothyroidism TSH within normal limit. -Continue new home Synthroid   Subjective: Patient was feeling a little weak.  No other complaints.  Denies any pain. A caregiver at bedside.  Physical Exam: Vitals:   07/22/22 0403 07/22/22 0500 07/22/22 0600 07/22/22 0602  BP:  99/68 110/86   Pulse: 91 85 82    Resp: 15 15 17    Temp:    98.7 F (37.1 C)  TempSrc:    Oral  SpO2: 90% 93% 100%   Weight:      Height:       General.  Frail elderly lady, in no acute distress. Pulmonary.  Lungs clear bilaterally, normal respiratory effort. CV.  Regular rate and rhythm, no JVD, rub or murmur. Abdomen.  Soft, nontender, nondistended, BS positive. CNS.  Alert and oriented .  No focal neurologic deficit. Extremities.  No edema, no cyanosis, pulses intact and symmetrical. Psychiatry.  Appears to have some cognitive impairment.  Data Reviewed: Prior data which include notes, labs and images reviewed  Family Communication: Discussed with caregiver at bedside  Disposition: Status is: Inpatient Remains inpatient appropriate because: Severity of illness   Planned Discharge Destination: Home  DVT prophylaxis.  Lovenox Time spent: 42 minutes  This record has been created using . Errors have been sought and corrected,but may not always be located. Such creation errors do not reflect on the standard of care.  Author: Conservation officer, historic buildings, MD 07/22/2022 1:35 PM  For on call review [email or web address].

## 2022-07-22 NOTE — Hospital Course (Addendum)
Taken from H&P.  Tamara [name] is a 76 y.o. female with medical history significant for type 2 diabetes mellitus, GERD, hypertension, hypothyroidism and depression, noted to emergency room with acute onset of altered mental status and frequent falls for couple visits on 8/17 and 8/20 and today has been having worsening altered mental status with mild confusion as well as low back pain surrounding her hips and acid cheated leg cramps.  No reported nausea or vomiting or diarrhea.  No reported abdominal pain or melena or bright red blood per rectum.  No fever or chills.  No chest pain or palpitations.  No dyspnea cough or wheezing.  She denied any significant dysuria, oliguria, urinary urgency or flank pain.  She admits to drinking significant mount of water that may cause urinary frequency.  She stated that she lost 55 pounds in the last 4 months and she has been trying to do that.   ED Course: Upon presenting to the emergency room, BP was 164/79 with heart rate of 97, otherwise normal vital signs.  Labs revealed worsening hyponatremia with a sodium of 128 down from 132 a couple days ago, chloride 94 compared to 96 then and CO2 of 20 compared to 23 then.  CBC showed leukocytosis 17.5 up from 10.7 with neutrophilia. UA with microscopic hematuria and mildly elevated WBC.   Imaging: Portable chest ray showed clear lungs with no acute cardiopulmonary disease. CT renal stone revealed: IMPRESSION: 1. No acute intra-abdominal or pelvic pathology. 2. Large stool burden suggesting constipation. 3. Midline lower anterior abdominal wall fat containing hernia, similar to prior. 4. Aortic atherosclerosis.  She was started on ceftriaxone for concern of UTI.  Urine cultures pending.  8/23: Leukocytosis resolved, all cell lines decreased, some dilutional effect. Procalcitonin negative, further decrease in sodium to 129. Patient received IV fluid in ED. hyponatremia labs consistent with isoosmolar hyponatremia.  TSH  within normal limit.  No recent procedures. Ordered CT chest for concern of SIADH.  Hold more IV fluid.  8/24: Corrected sodium 130.  CT chest was without any significant abnormality.  Urine cultures negative-stop being antibiotics.  Patient to avoid free water and add salt to diet. She will need a repeat BMP next couple of days by PCP.  She will continue on current medications and follow-up with her providers.

## 2022-07-23 DIAGNOSIS — A4150 Gram-negative sepsis, unspecified: Secondary | ICD-10-CM | POA: Diagnosis not present

## 2022-07-23 DIAGNOSIS — N390 Urinary tract infection, site not specified: Secondary | ICD-10-CM | POA: Diagnosis not present

## 2022-07-23 LAB — CBC
HCT: 31.9 % — ABNORMAL LOW (ref 36.0–46.0)
Hemoglobin: 10.4 g/dL — ABNORMAL LOW (ref 12.0–15.0)
MCH: 26.9 pg (ref 26.0–34.0)
MCHC: 32.6 g/dL (ref 30.0–36.0)
MCV: 82.6 fL (ref 80.0–100.0)
Platelets: 379 10*3/uL (ref 150–400)
RBC: 3.86 MIL/uL — ABNORMAL LOW (ref 3.87–5.11)
RDW: 13.9 % (ref 11.5–15.5)
WBC: 9.7 10*3/uL (ref 4.0–10.5)
nRBC: 0 % (ref 0.0–0.2)

## 2022-07-23 LAB — BASIC METABOLIC PANEL
Anion gap: 9 (ref 5–15)
BUN: 25 mg/dL — ABNORMAL HIGH (ref 8–23)
CO2: 25 mmol/L (ref 22–32)
Calcium: 9.1 mg/dL (ref 8.9–10.3)
Chloride: 95 mmol/L — ABNORMAL LOW (ref 98–111)
Creatinine, Ser: 0.77 mg/dL (ref 0.44–1.00)
GFR, Estimated: >60 mL/min (ref >60)
Glucose, Bld: 156 mg/dL — ABNORMAL HIGH (ref 70–99)
Potassium: 3.8 mmol/L (ref 3.5–5.1)
Sodium: 129 mmol/L — ABNORMAL LOW (ref 135–145)

## 2022-07-23 LAB — GLUCOSE, CAPILLARY
Glucose-Capillary: 148 mg/dL — ABNORMAL HIGH (ref 70–99)
Glucose-Capillary: 155 mg/dL — ABNORMAL HIGH (ref 70–99)

## 2022-07-23 MED ORDER — PROPRANOLOL HCL 20 MG PO TABS: 20.0000 mg | ORAL_TABLET | Freq: Two times a day (BID) | ORAL | 0 refills | Status: DC

## 2022-07-23 NOTE — Discharge Summary (Addendum)
Physician Discharge Summary   Patient: Tamara [name] MRN: [id number] DOB: [birth year]  Admit date:     07/21/2022  Discharge date: 07/23/22  Discharge Physician: Lorella Nimrod   PCP: [facility]   Recommendations at discharge:  Please obtain BMP within a week Patient should be encouraged to use little salt and rehydration fluid instead of free water. Follow-up with primary care provider within a week  Discharge Diagnoses: Principal Problem:   Sepsis due to gram-negative UTI [facility]) Active Problems:   Hyponatremia   Acute metabolic encephalopathy   Essential hypertension   Uncontrolled type 2 diabetes mellitus with hyperglycemia, with long-term current use of insulin (HCC)   Anxiety and depression   GERD without esophagitis   Hypothyroidism   Hospital Course: Taken from H&P.  Tamara [name] is a 76 y.o. female with medical history significant for type 2 diabetes mellitus, GERD, hypertension, hypothyroidism and depression, noted to emergency room with acute onset of altered mental status and frequent falls for couple visits on 8/17 and 8/20 and today has been having worsening altered mental status with mild confusion as well as low back pain surrounding her hips and acid cheated leg cramps.  No reported nausea or vomiting or diarrhea.  No reported abdominal pain or melena or bright red blood per rectum.  No fever or chills.  No chest pain or palpitations.  No dyspnea cough or wheezing.  She denied any significant dysuria, oliguria, urinary urgency or flank pain.  She admits to drinking significant mount of water that may cause urinary frequency.  She stated that she lost 55 pounds in the last 4 months and she has been trying to do that.   ED Course: Upon presenting to the emergency room, BP was 164/79 with heart rate of 97, otherwise normal vital signs.  Labs revealed worsening hyponatremia with a sodium of 128 down from 132 a couple days ago, chloride 94 compared to 96 then and  CO2 of 20 compared to 23 then.  CBC showed leukocytosis 17.5 up from 10.7 with neutrophilia. UA with microscopic hematuria and mildly elevated WBC.   Imaging: Portable chest ray showed clear lungs with no acute cardiopulmonary disease. CT renal stone revealed: IMPRESSION: 1. No acute intra-abdominal or pelvic pathology. 2. Large stool burden suggesting constipation. 3. Midline lower anterior abdominal wall fat containing hernia, similar to prior. 4. Aortic atherosclerosis.  She was started on ceftriaxone for concern of UTI.  Urine cultures pending.  8/23: Leukocytosis resolved, all cell lines decreased, some dilutional effect. Procalcitonin negative, further decrease in sodium to 129. Patient received IV fluid in ED. hyponatremia labs consistent with isoosmolar hyponatremia.  TSH within normal limit.  No recent procedures. Ordered CT chest for concern of SIADH.  Hold more IV fluid.  8/24: Corrected sodium 130.  CT chest was without any significant abnormality.  Urine cultures negative-stop being antibiotics.  Patient to avoid free water and add salt to diet. She will need a repeat BMP next couple of days by PCP.  She will continue on current medications and follow-up with her providers.  Assessment and Plan: * Sepsis due to gram-negative UTI ([place]) Sepsis ruled out.Urine cultures are negative. Leukocytosis resolved, may have SIR's response.  Hyponatremia Further decrease in sodium to 129 with IV fluid.  Labs consistent with isoosmolar hyponatremia.  TSH within normal limit.  Patient is on multiple psychotic medications at home which can be contributory. -Discontinue further IV fluid. -Check CT chest for concern of SIADH -Monitor sodium  Acute  metabolic encephalopathy Most likely secondary to UTI, also has mild hyponatremia - She has been having associated leg cramps and recurrent falls. - PT consult-recommending home health PT   Essential hypertension Blood pressure within  goal. - Continue home propranolol  Uncontrolled type 2 diabetes mellitus with hyperglycemia, with long-term current use of insulin ([place]) - The patient will be placed on supplement coverage with NovoLog. - We will continue her basal coverage. - We will hold off metformin.  Anxiety and depression Patient is on multiple psychotic medications. -Continue home meds -Patient need to have a close follow-up with her psychiatrist for a good med rec and further management  GERD without esophagitis - We will continue PPI therapy.  Hypothyroidism TSH within normal limit. -Continue new home Synthroid   Consultants: None Procedures performed: None Disposition: Home Diet recommendation:  Discharge Diet Orders (From admission, onward)     Start     Ordered   07/23/22 0000  Diet - low sodium heart healthy        07/23/22 1028           Cardiac and Carb modified diet DISCHARGE MEDICATION: Allergies as of 07/23/2022       Reactions   Ace Inhibitors Cough        Medication List     STOP taking these medications    Trulicity 1.5 EH/2.0NO Sopn Generic drug: Dulaglutide   valsartan 80 MG tablet Commonly known as: DIOVAN       TAKE these medications    acetaminophen 325 MG tablet Commonly known as: TYLENOL Take 650 mg by mouth every 6 (six) hours as needed.   ARIPiprazole 5 MG tablet Commonly known as: ABILIFY Take 5 mg by mouth at bedtime.   aspirin 81 MG chewable tablet Chew 81 mg by mouth daily.   atorvastatin 20 MG tablet Commonly known as: LIPITOR Take 20 mg by mouth at bedtime.   blood glucose meter kit and supplies Kit Dispense based on patient and insurance preference. Use up to four times daily as directed.   buPROPion HCl ER (XL) 450 MG Tb24 Take 450 mg by mouth daily.   busPIRone 10 MG tablet Commonly known as: BUSPAR Take 10 mg by mouth 2 (two) times daily.   diclofenac Sodium 1 % Gel Commonly known as: VOLTAREN Apply 2 g topically 4 (four)  times daily.   DULoxetine 60 MG capsule Commonly known as: CYMBALTA Take 60 mg by mouth 2 (two) times daily.   esomeprazole 20 MG capsule Commonly known as: NEXIUM Take 20 mg by mouth daily. To prevent coughing after eating.   fluticasone 50 MCG/ACT nasal spray Commonly known as: FLONASE Place 1 spray into both nostrils 2 (two) times daily.   insulin glargine 100 UNIT/ML Solostar Pen Commonly known as: LANTUS Inject 10 Units into the skin daily.   lamoTRIgine 150 MG tablet Commonly known as: LAMICTAL Take 150 mg by mouth 2 (two) times daily.   levothyroxine 112 MCG tablet Commonly known as: SYNTHROID Take 112 mcg by mouth daily.   metFORMIN 500 MG tablet Commonly known as: GLUCOPHAGE Take 500 mg by mouth 2 (two) times daily.   oxyCODONE-acetaminophen 5-325 MG tablet Commonly known as: Percocet Take 1 tablet by mouth every 4 (four) hours as needed for severe pain.   propranolol 20 MG tablet Commonly known as: INDERAL Take 1 tablet (20 mg total) by mouth 2 (two) times daily. What changed: medication strength   tiZANidine 2 MG tablet Commonly known as: ZANAFLEX Take  0.5 tablets (1 mg total) by mouth at bedtime as needed.        Follow-up Information     [place]. Go on 07/27/2022.   Why: Appt w/ Hilton Sinclair, PA-C @ 3:00 pm Contact information: [place] [address] [phone]                Discharge Exam: Filed Weights   07/21/22 0823  Weight: 72.6 kg   General.     In no acute distress. Pulmonary.  Lungs clear bilaterally, normal respiratory effort. CV.  Regular rate and rhythm, no JVD, rub or murmur. Abdomen.  Soft, nontender, nondistended, BS positive. CNS.  Alert and oriented .  No focal neurologic deficit. Extremities.  No edema, no cyanosis, pulses intact and symmetrical. Psychiatry.  Judgment and insight appears normal.   Condition at discharge: stable  The results of significant diagnostics from  this hospitalization (including imaging, microbiology, ancillary and laboratory) are listed below for reference.   Imaging Studies: CT CHEST W CONTRAST  Result Date: 07/22/2022 CLINICAL DATA:  Unintended weight loss, leg cramps EXAM: CT CHEST WITH CONTRAST TECHNIQUE: Multidetector CT imaging of the chest was performed during intravenous contrast administration. RADIATION DOSE REDUCTION: This exam was performed according to the departmental dose-optimization program which includes automated exposure control, adjustment of the mA and/or kV according to patient size and/or use of iterative reconstruction technique. CONTRAST:  76m OMNIPAQUE IOHEXOL 300 MG/ML  SOLN COMPARISON:  Lumbar MR and CT abdomen from previous day FINDINGS: Cardiovascular: Heart size normal. No pericardial effusion. Central pulmonary arteries unremarkable. Scattered coronary and aortic calcifications. No thoracic aortic aneurysm. Mediastinum/Nodes: No mediastinal   mass or adenopathy. Lungs/Pleura: No pleural effusion.  Lungs are clear. Upper Abdomen: Simple appearing bilateral renal cysts present since 05/18/2017, no follow-up warranted. No acute findings. Musculoskeletal: Mixed lytic/sclerotic process involving left anterior aspect of the L1 inferior endplate corresponding to degenerative change seen on previous day's MRI. Partially calcified epidural process on the right at L2-3, better characterized on previous CT. Findings were reviewed with Dr. VLin Landsman who concurs. Stable postop changes across the cervicothoracic junction, midthoracic spine, and partially visualized in the lower lumbar spine. IMPRESSION: 1. No acute thoracic process. 2. Coronary and Aortic Atherosclerosis (ICD10-170.0). 3. Post thoracic and lumbar fusion surgery, with presumed degenerative processes involving the upper lumbar spine. Electronically Signed   By: DLucrezia EuropeM.D.   On: 07/22/2022 14:58   MR Lumbar Spine W Wo Contrast  Result Date: 07/21/2022 CLINICAL  DATA:  Low back pain. Cauda equina syndrome suspected. Multiple recent falls. Previous lumbar fusion. Concern for abscess. EXAM: MRI LUMBAR SPINE WITHOUT AND WITH CONTRAST TECHNIQUE: Multiplanar and multiecho pulse sequences of the lumbar spine were obtained without and with intravenous contrast. CONTRAST:  735mGADAVIST GADOBUTROL 1 MMOL/ML IV SOLN COMPARISON:  Abdominopelvic CT 07/21/2022 and 05/18/2017. No previous dedicated lumbar spine imaging available. FINDINGS: Despite efforts by the technologist and patient, at least moderate motion artifact is present on today's exam and could not be eliminated. This reduces exam sensitivity and specificity. The motion progresses throughout the examination, and the postcontrast images are nearly nondiagnostic. Segmentation: Conventional anatomy assumed, with the last open disc space designated L5-S1.Concordant with previous imaging. Alignment: Grade 1 retrolisthesis at T12-L1, L1-2 and L2-3. Mild convex left scoliosis. Vertebrae: Status post posterior lumbar and interbody fusion from L3 through S1 with solid interbody fusion as correlated with previous CT. Anterior fusion has also been performed at L5-S1. There is susceptibility  artifact related to the spinal hardware. There is a fracture involving the inferior endplate of L1 which appears subacute on CT, with associated marrow and surrounding soft tissue edema. There is also marrow edema within the superior endplate of L2. No gross endplate destruction or discal hyperintensity to strongly suggest discitis/osteomyelitis. There is a healed inferior endplate compression fracture at T12 without associated abnormal marrow signal. Conus medullaris: Extends to the L1-2 level and appears normal. Paraspinal and other soft tissues: Anteriorly displaced bony fragments associated with the inferior endplate compression fracture at L1 with surrounding soft tissue thickening. No focal fluid collection. Disc levels: The axial images are  significantly motion degraded. T11-12: Normal interspace. T12-L1: Advanced spondylosis with posterior osteophytes indenting the ventral surface of the distal thoracic cord. Mild foraminal narrowing bilaterally. L1-2: As above, subacute appearing inferior endplate compression fracture at L1 with associated endplate edema, but no gross endplate destruction or discal hyperintensity to strongly suggest infection. Posterior osteophytes, facet and ligamentous hypertrophy contribute to moderate spinal stenosis and moderate foraminal narrowing bilaterally. L2-3: Chronic adjacent segment disease with loss of disc height, annular disc bulging and posterior osteophytes. Resulting moderate multifactorial spinal stenosis with mild to moderate lateral recess and foraminal narrowing bilaterally. L3-4: No significant spinal stenosis or nerve root encroachment post PLIF. L4-5: Chronic right-greater-than-left foraminal narrowing due to facet hypertrophy post PLIF. The spinal canal is decompressed. L5-S1: Mild chronic foraminal narrowing bilaterally post PLIF. Decompressed spinal canal. IMPRESSION: 1. Study is significantly motion degraded, especially the axial and postcontrast images. 2. Subacute appearing inferior endplate compression fracture at L1 with associated bone marrow edema and enhancement, likely reactive. No endplate destruction, discal hyperintensity or surrounding fluid collection to strongly suggest discitis/osteomyelitis. If that remains a clinical concern, short-term follow-up imaging may be warranted. Without sedation, dedicated lumbar spine CT may be preferable to repeat MRI. 3. Moderate multifactorial spinal stenosis at L1-2 with moderate foraminal narrowing bilaterally. 4. Adjacent segment disease at L2-3 with moderate multifactorial spinal stenosis. 5. Status post L3 through S1 fusion without significant residual spinal stenosis. Electronically Signed   By: Richardean Sale M.D.   On: 07/21/2022 12:11   DG  Chest Portable 1 View  Result Date: 07/21/2022 CLINICAL DATA:  Weakness and leg cramps EXAM: PORTABLE CHEST 1 VIEW COMPARISON:  12/17/2017 FINDINGS: Heart size and vascularity normal. Lungs clear without infiltrate or effusion Unilateral pedicle screw fusion midthoracic spine on the left with apparent corpectomy. Anterior and posterior fusion cervical spine. IMPRESSION: No active disease. Electronically Signed   By: Franchot Gallo M.D.   On: 07/21/2022 10:34   CT Renal Stone Study  Result Date: 07/21/2022 CLINICAL DATA:  Patient to ED for leg cramping. Patient states that this episode starting during the night and woke her up from her sleep. EXAM: CT ABDOMEN AND PELVIS WITHOUT CONTRAST TECHNIQUE: Multidetector CT imaging of the abdomen and pelvis was performed following the standard protocol without IV contrast. RADIATION DOSE REDUCTION: This exam was performed according to the departmental dose-optimization program which includes automated exposure control, adjustment of the mA and/or kV according to patient size and/or use of iterative reconstruction technique. COMPARISON:  CT abdomen pelvis 05/18/2017 FINDINGS: Lower chest: Small hiatal hernia. Evaluation of the abdominal viscera limited by lack of IV contrast. Hepatobiliary: No focal liver abnormality is seen. Normal appearance of the gallbladder. Pancreas: Unremarkable. No surrounding inflammatory changes. Spleen: Normal in size without focal abnormality. Adrenals/Urinary Tract: Adrenal glands are unremarkable. No renal calculi or hydronephrosis. There is a cyst in the posterior right  kidney, evaluation somewhat limited secondary to streak artifact from patient's adjacent spinal hardware. There is a cyst in the outer left kidney. Urinary bladder is unremarkable. Stomach/Bowel: Stomach is within normal limits. Appendix appears normal. No evidence of bowel wall thickening, distention, or inflammatory changes. Large stool burden. Vascular/Lymphatic: Aortic  atherosclerosis. No enlarged abdominal or pelvic lymph nodes. Reproductive: Uterus and bilateral adnexa are unremarkable. Other: Similar to prior there is a midline fat containing hernia inferior to the umbilicus. No evidence of incarcerated bowel strangulation. The hernia measures approximately 5.9 x 3.1 cm. Musculoskeletal: Spinal fusion hardware spanning L3-S1. Hardware appears intact. There are multilevel thoracic and lumbar spine degenerative changes. No acute finding. IMPRESSION: 1. No acute intra-abdominal or pelvic pathology. 2. Large stool burden suggesting constipation. 3. Midline lower anterior abdominal wall fat containing hernia, similar to prior. 4. Aortic atherosclerosis. Aortic Atherosclerosis (ICD10-I70.0). Electronically Signed   By: Audie Pinto M.D.   On: 07/21/2022 10:24   MR HIP LEFT WO CONTRAST  Result Date: 07/19/2022 CLINICAL DATA:  Chronic bilateral hip pain. EXAM: MR OF THE LEFT HIP WITHOUT CONTRAST TECHNIQUE: Multiplanar, multisequence MR imaging was performed. No intravenous contrast was administered. COMPARISON:  None Available. FINDINGS: Patient motion degrading image quality limiting evaluation. Bones: No hip fracture, dislocation or avascular necrosis. No periosteal reaction or bone destruction. No aggressive osseous lesion. Normal sacrum and sacroiliac joints. No SI joint widening or erosive changes. Posterior lumbar interbody fusion at L4-5 and L5-S1. Articular cartilage and labrum Articular cartilage: Partial-thickness cartilage loss of the femoral head and acetabulum bilaterally. Labrum: Extremely limited evaluation of the labrum secondary to patient motion and lack of intra-articular fluid. Joint or bursal effusion Joint effusion:  No hip joint effusion.  No SI joint effusion. Bursae:  No bursal fluid. Muscles and tendons Flexors: Normal. Extensors: Normal. Abductors: Normal. Adductors: Normal. Gluteals: Normal. Hamstrings: Normal. Other findings No pelvic free fluid.  No fluid collection or hematoma. No inguinal lymphadenopathy. No inguinal hernia. 2.8 x 4.8 cm fluid collection in the subcutaneous fat overlying the left greater trochanter with surrounding soft tissue edema concerning for liquified hematoma secondary to direct trauma versus cellulitis and an abscess. IMPRESSION: 1. No hip fracture, dislocation or avascular necrosis bilaterally. 2. Mild osteoarthritis of bilateral hips. 3. A 2.8 x 4.8 cm fluid collection in the subcutaneous fat overlying the left greater trochanter with surrounding soft tissue edema concerning for liquified hematoma secondary to direct trauma versus cellulitis and an abscess. Electronically Signed   By: Kathreen Devoid M.D.   On: 07/19/2022 14:09   MR HIP RIGHT WO CONTRAST  Result Date: 07/19/2022 CLINICAL DATA:  Chronic bilateral hip pain. EXAM: MR OF THE LEFT HIP WITHOUT CONTRAST TECHNIQUE: Multiplanar, multisequence MR imaging was performed. No intravenous contrast was administered. COMPARISON:  None Available. FINDINGS: Patient motion degrading image quality limiting evaluation. Bones: No hip fracture, dislocation or avascular necrosis. No periosteal reaction or bone destruction. No aggressive osseous lesion. Normal sacrum and sacroiliac joints. No SI joint widening or erosive changes. Posterior lumbar interbody fusion at L4-5 and L5-S1. Articular cartilage and labrum Articular cartilage: Partial-thickness cartilage loss of the femoral head and acetabulum bilaterally. Labrum: Extremely limited evaluation of the labrum secondary to patient motion and lack of intra-articular fluid. Joint or bursal effusion Joint effusion:  No hip joint effusion.  No SI joint effusion. Bursae:  No bursal fluid. Muscles and tendons Flexors: Normal. Extensors: Normal. Abductors: Normal. Adductors: Normal. Gluteals: Normal. Hamstrings: Normal. Other findings No pelvic free fluid. No fluid collection or  hematoma. No inguinal lymphadenopathy. No inguinal hernia. 2.8 x  4.8 cm fluid collection in the subcutaneous fat overlying the left greater trochanter with surrounding soft tissue edema concerning for liquified hematoma secondary to direct trauma versus cellulitis and an abscess. IMPRESSION: 1. No hip fracture, dislocation or avascular necrosis bilaterally. 2. Mild osteoarthritis of bilateral hips. 3. A 2.8 x 4.8 cm fluid collection in the subcutaneous fat overlying the left greater trochanter with surrounding soft tissue edema concerning for liquified hematoma secondary to direct trauma versus cellulitis and an abscess. Electronically Signed   By: Kathreen Devoid M.D.   On: 07/19/2022 14:09   EEG adult  Result Date: 07/18/2022 Derek Jack, MD     07/18/2022  1:06 PM Routine EEG Report Asjah Rauda is a 76 y.o. female with a history of encephalopathy who is undergoing an EEG to evaluate for seizures. Report: This EEG was acquired with electrodes placed according to the International 10-20 electrode system (including Fp1, Fp2, F3, F4, C3, C4, P3, P4, O1, O2, T3, T4, T5, T6, A1, A2, Fz, Cz, Pz). The following electrodes were missing or displaced: none. The occipital dominant rhythm was 7-8 Hz. This activity is reactive to stimulation. Drowsiness was manifested by background fragmentation; deeper stages of sleep were identified by K complexes and sleep spindles. There was no focal slowing. There were no interictal epileptiform discharges. There were no electrographic seizures identified. There was no abnormal response to photic stimulation or hyperventilation. Impression and clinical correlation: This EEG was obtained while awake and asleep and is abnormal due to mild diffuse slowing indicative of global cerebral dysfunction. Epileptiform abnormalities were not seen during this recording. Su Monks, MD [facility] [phone] If 7pm- 7am, please page neurology on call as listed in [place].   ECHOCARDIOGRAM COMPLETE  Result Date: 07/17/2022    ECHOCARDIOGRAM  REPORT   Patient Name:   ASHAUNTI TREPTOW Date of Exam: 07/17/2022 Medical Rec #:  [id number]     Height:       67.0 in Accession #:    [id number]    Weight:       160.3 lb Date of Birth:  Jun 10, 1946     BSA:          1.841 m Patient Age:    45 years      BP:           177/82 mmHg Patient Gender: F             HR:           87 bpm. Exam Location:  [facility] Procedure: 2D Echo, Color Doppler and Cardiac Doppler Indications:     I63.9 Stroke  History:         Patient has no prior history of Echocardiogram examinations.                  CKD; Risk Factors:Hypertension and Diabetes.  Sonographer:     Charmayne Sheer Referring Phys:  [place] Diagnosing Phys: Ida Rogue MD  Sonographer Comments: Technically difficult study due to poor echo windows. Image acquisition challenging due to uncooperative patient. IMPRESSIONS  1. Left ventricular ejection fraction, by estimation, is 60 to 65%. The left ventricle has normal function. The left ventricle has no regional wall motion abnormalities. Left ventricular diastolic parameters are consistent with Grade I diastolic dysfunction (impaired relaxation).  2. Right ventricular systolic function is normal. The right ventricular size is normal. Tricuspid regurgitation signal is inadequate for assessing PA pressure.  3. The mitral valve is normal in structure. Mild mitral valve regurgitation. No evidence of mitral stenosis.  4. The aortic valve was not well visualized. Aortic valve regurgitation is not visualized. No aortic stenosis is present.  5. The inferior vena cava is normal in size with greater than 50% respiratory variability, suggesting right atrial pressure of 3 mmHg. FINDINGS  Left Ventricle: Left ventricular ejection fraction, by estimation, is 60 to 65%. The left ventricle has normal function. The left ventricle has no regional wall motion abnormalities. The left ventricular internal cavity size was normal in size. There is  no left ventricular hypertrophy. Left  ventricular diastolic parameters are consistent with Grade I diastolic dysfunction (impaired relaxation). Right Ventricle: The right ventricular size is normal. No increase in right ventricular wall thickness. Right ventricular systolic function is normal. Tricuspid regurgitation signal is inadequate for assessing PA pressure. Left Atrium: Left atrial size was normal in size. Right Atrium: Right atrial size was normal in size. Pericardium: There is no evidence of pericardial effusion. Mitral Valve: The mitral valve is normal in structure. Mild mitral valve regurgitation. No evidence of mitral valve stenosis. Tricuspid Valve: The tricuspid valve is normal in structure. Tricuspid valve regurgitation is not demonstrated. No evidence of tricuspid stenosis. Aortic Valve: The aortic valve was not well visualized. Aortic valve regurgitation is not visualized. No aortic stenosis is present. Aortic valve mean gradient measures 3.0 mmHg. Aortic valve peak gradient measures 4.7 mmHg. Aortic valve area, by VTI measures 2.86 cm. Pulmonic Valve: The pulmonic valve was normal in structure. Pulmonic valve regurgitation is not visualized. No evidence of pulmonic stenosis. Aorta: The aortic root is normal in size and structure. Venous: The inferior vena cava is normal in size with greater than 50% respiratory variability, suggesting right atrial pressure of 3 mmHg. IAS/Shunts: No atrial level shunt detected by color flow Doppler.  LEFT VENTRICLE PLAX 2D LVIDd:         3.48 cm     Diastology LVIDs:         2.13 cm     LV e' medial:    5.66 cm/s LV PW:         1.14 cm     LV E/e' medial:  11.6 LV IVS:        0.88 cm     LV e' lateral:   7.18 cm/s LVOT diam:     2.00 cm     LV E/e' lateral: 9.1 LV SV:         48 LV SV Index:   26 LVOT Area:     3.14 cm  LV Volumes (MOD) LV vol d, MOD A2C: 49.5 ml LV vol d, MOD A4C: 67.5 ml LV vol s, MOD A2C: 20.0 ml LV vol s, MOD A4C: 28.7 ml LV SV MOD A2C:     29.5 ml LV SV MOD A4C:     67.5 ml LV SV  MOD BP:      35.4 ml RIGHT VENTRICLE RV Basal diam:  3.01 cm RV Mid diam:    3.30 cm LEFT ATRIUM             Index        RIGHT ATRIUM          Index LA diam:        3.70 cm 2.01 cm/m   RA Area:     8.36 cm LA Vol (A2C):   43.6 ml 23.69 ml/m  RA Volume:   15.30 ml  8.31 ml/m LA Vol (A4C):   31.7 ml 17.22 ml/m LA Biplane Vol: 37.3 ml 20.26 ml/m  AORTIC VALVE                    PULMONIC VALVE AV Area (Vmax):    2.62 cm     PV Vmax:       0.81 m/s AV Area (Vmean):   2.41 cm     PV Peak grad:  2.7 mmHg AV Area (VTI):     2.86 cm AV Vmax:           108.00 cm/s AV Vmean:          75.300 cm/s AV VTI:            0.169 m AV Peak Grad:      4.7 mmHg AV Mean Grad:      3.0 mmHg LVOT Vmax:         90.10 cm/s LVOT Vmean:        57.700 cm/s LVOT VTI:          0.154 m LVOT/AV VTI ratio: 0.91  AORTA Ao Root diam: 2.90 cm MITRAL VALVE MV Area (PHT): 7.02 cm    SHUNTS MV Decel Time: 108 msec    Systemic VTI:  0.15 m MV E velocity: 65.60 cm/s  Systemic Diam: 2.00 cm MV A velocity: 89.10 cm/s MV E/A ratio:  0.74 Ida Rogue MD Electronically signed by Ida Rogue MD Signature Date/Time: 07/17/2022/10:51:31 AM    Final    MR BRAIN WO CONTRAST  Result Date: 07/16/2022 CLINICAL DATA:  Right facial droop EXAM: MRI HEAD WITHOUT CONTRAST TECHNIQUE: Multiplanar, multiecho pulse sequences of the brain and surrounding structures were obtained without intravenous contrast. COMPARISON:  None Available. FINDINGS: Brain: No acute infarct, mass effect or extra-axial collection. No acute or chronic hemorrhage. Normal white matter signal. Generalized volume loss. The midline structures are normal. Vascular: Major flow voids are preserved. Skull and upper cervical spine: Normal calvarium and skull base. Visualized upper cervical spine and soft tissues are normal. Sinuses/Orbits:No paranasal sinus fluid levels or advanced mucosal thickening. No mastoid or middle ear effusion. Normal orbits. IMPRESSION: 1. No acute intracranial  abnormality. 2. Generalized volume loss. Electronically Signed   By: Ulyses Jarred M.D.   On: 07/16/2022 22:07   DG Pelvis 1-2 Views  Result Date: 07/16/2022 CLINICAL DATA:  Left hip pain EXAM: PELVIS - 1-2 VIEW COMPARISON:  Radiographs 06/05/2021 FINDINGS: No fracture or dislocation. Degenerative changes pubic symphysis, both hips, SI joints and lower lumbar spine. Lumbar spine fusion hardware. Enteric contrast within the bladder from CT earlier today. IMPRESSION: No acute fracture or dislocation. Moderate bilateral hip degenerative arthritis. Electronically Signed   By: Placido Sou M.D.   On: 07/16/2022 20:18   CT ANGIO HEAD NECK W WO CM W PERF (CODE STROKE)  Result Date: 07/16/2022 CLINICAL DATA:  Slurred speech and right facial droop EXAM: CT ANGIOGRAPHY HEAD AND NECK CT PERFUSION BRAIN TECHNIQUE: Multidetector CT imaging of the head and neck was performed using the standard protocol during bolus administration of intravenous contrast. Multiplanar CT image reconstructions and MIPs were obtained to evaluate the vascular anatomy. Carotid stenosis measurements (when applicable) are obtained utilizing NASCET criteria, using the distal internal carotid diameter as the denominator. Multiphase CT imaging of the brain was performed following IV bolus contrast injection. Subsequent parametric perfusion maps were calculated using RAPID software. RADIATION DOSE REDUCTION: This exam was performed according to the departmental dose-optimization program which  includes automated exposure control, adjustment of the mA and/or kV according to patient size and/or use of iterative reconstruction technique. CONTRAST:  1651m OMNIPAQUE IOHEXOL 350 MG/ML SOLN COMPARISON:  None Available. FINDINGS: CTA NECK FINDINGS SKELETON: There is no bony spinal canal stenosis. No lytic or blastic lesion. OTHER NECK: Normal pharynx, larynx and major salivary glands. No cervical lymphadenopathy. Unremarkable thyroid gland. UPPER CHEST: No  pneumothorax or pleural effusion. No nodules or masses. AORTIC ARCH: There is no calcific atherosclerosis of the aortic arch. There is no aneurysm, dissection or hemodynamically significant stenosis of the visualized portion of the aorta. Normal variant aortic arch branching pattern with the left vertebral artery arising independently from the aortic arch. The visualized proximal subclavian arteries are widely patent. RIGHT CAROTID SYSTEM: Normal without aneurysm, dissection or stenosis. LEFT CAROTID SYSTEM: Normal without aneurysm, dissection or stenosis. VERTEBRAL ARTERIES: Left dominant configuration. Both origins are clearly patent. There is no dissection, occlusion or flow-limiting stenosis to the skull base (V1-V3 segments). CTA HEAD FINDINGS POSTERIOR CIRCULATION: --Vertebral arteries: Normal V4 segments. --Inferior cerebellar arteries: Normal. --Basilar artery: Normal. --Superior cerebellar arteries: Normal. --Posterior cerebral arteries (PCA): Normal. ANTERIOR CIRCULATION: --Intracranial internal carotid arteries: Normal. --Anterior cerebral arteries (ACA): Normal. Absent right A1 segment, normal variant --Middle cerebral arteries (MCA): Normal. VENOUS SINUSES: As permitted by contrast timing, patent. ANATOMIC VARIANTS: None Review of the MIP images confirms the above findings. CT Brain Perfusion Findings: ASPECTS: 10 CBF (<30%) Volume: 071mPerfusion (Tmax>6.0s) volume: 51m46mismatch Volume: 51mL751mfarction Location:No core infarct. The perfusion deficit is located in the right temporal lobe but is of questionable accuracy. MRI would provide a more definitive answer. IMPRESSION: 1. No emergent large vessel occlusion or high-grade stenosis of the intracranial arteries. 2. No core infarct. 3 mL perfusion deficit is located in the right temporal lobe but is of questionable accuracy. MRI would provide a more definitive answer. Electronically Signed   By: KeviUlyses Jarred.   On: 07/16/2022 19:53   CT HEAD CODE  STROKE WO CONTRAST  Result Date: 07/16/2022 CLINICAL DATA:  Code stroke.  Acute neuro deficit.  Slurred speech EXAM: CT HEAD WITHOUT CONTRAST TECHNIQUE: Contiguous axial images were obtained from the base of the skull through the vertex without intravenous contrast. RADIATION DOSE REDUCTION: This exam was performed according to the departmental dose-optimization program which includes automated exposure control, adjustment of the mA and/or kV according to patient size and/or use of iterative reconstruction technique. COMPARISON:  CT head 05/08/2022 FINDINGS: Brain: Mild atrophy. Mild white matter hypodensity bilaterally appears chronic. Negative for acute infarct, hemorrhage, mass. Vascular: Negative for hyperdense vessel Skull: Negative Sinuses/Orbits: Paranasal sinuses clear.  Negative orbit Other: None ASPECTS ([place] Program Early CT Score) - Ganglionic level infarction (caudate, lentiform nuclei, internal capsule, insula, M1-M3 cortex): 7 - Supraganglionic infarction (M4-M6 cortex): 3 Total score (0-10 with 10 being normal): 10 IMPRESSION: 1. No acute intracranial abnormality 2. ASPECTS is 10 3. Mild atrophy and mild chronic microvascular ischemic change in the white matter. 4. These results were called by telephone at the time of interpretation on 07/16/2022 at 6:50 pm to provider PHILLIP STAFFORD , who verbally acknowledged these results. Electronically Signed   By: CharFranchot Gallo.   On: 07/16/2022 18:51    Microbiology: Results for orders placed or performed during the hospital encounter of 07/21/22  Urine Culture     Status: None   Collection Time: 07/21/22  2:37 PM   Specimen: Urine, Clean Catch  Result Value Ref Range Status  Specimen Description   Final    URINE, CLEAN CATCH Performed at [facility], [address] [place], [place] [address]    Special Requests   Final    NONE Performed at [facility], [address], [place], [place] [address]     Culture   Final    NO GROWTH Performed at [facility], [place] [address], [place], [place] [address]    Report Status 07/22/2022 FINAL  Final    Labs: CBC: Recent Labs  Lab 07/16/22 1751 07/16/22 1914 07/19/22 1151 07/21/22 0838 07/22/22 0517 07/23/22 0456  WBC 10.9* 12.2* 10.7* 17.5* 10.3 9.7  NEUTROABS 8.0* 9.6* 8.9* 15.1*  --   --   HGB 11.6* 11.8* 12.4 12.3 9.8* 10.4*  HCT 35.6* 36.7 38.5 37.4 31.6* 31.9*  MCV 84.4 83.6 83.2 82.4 85.6 82.6  PLT 394 377 429* 435* 376 150   Basic Metabolic Panel: Recent Labs  Lab 07/16/22 1914 07/17/22 0133 07/19/22 1151 07/21/22 0838 07/22/22 0517 07/23/22 0456  NA 129* 134* 132* 128* 129* 129*  K 4.6  --  4.2 3.9 3.9 3.8  CL 91*  --  96* 94* 98 95*  CO2 26  --  23 20* 23 25  GLUCOSE 148*  --  225* 245* 177* 156*  BUN 22  --  _0 25*  CREATININE 0.82  --  0.82 0.90 0.87 0.77  CALCIUM 9.5  --  9.6 9.3 8.7* 9.1   Liver Function Tests: Recent Labs  Lab 07/16/22 1751 07/16/22 1914 07/19/22 1151 07/21/22 0838  AST 22 33 33 31  ALT _1 ALKPHOS 86 83 83 88  BILITOT <0.1* 1.0 0.8 1.1  PROT 7.3 7.5 7.6 7.5  ALBUMIN 4.0 4.1 4.3 4.1   CBG: Recent Labs  Lab 07/21/22 2156 07/22/22 0936 07/22/22 1734 07/22/22 2104 07/23/22 0751  GLUCAP 186* 162* 126* 89 148*    Discharge time spent: greater than 30 minutes.  This record has been created using Systems analyst. Errors have been sought and corrected,but may not always be located. Such creation errors do not reflect on the standard of care.   Signed: Lorella Nimrod, MD [facility] 07/23/2022

## 2022-07-23 NOTE — TOC Progression Note (Signed)
Transition of Care [facility]) - Progression Note    Patient Details  Name: Monserrate Blaschke MRN: [id number] Date of Birth: [birth year]  [facility]) CM/SW Contact  Marlowe Sax, RN Phone Number: 07/23/2022, 1:47 PM  Clinical Narrative:    Spoke with Mithew her POA, EMS called to transport home, he understands there may be a charge from EMS as Ins may not cover,   EMS called to transport home  Expected Discharge Plan: Home w Home Health Services Barriers to Discharge: Continued Medical Work up  Expected Discharge Plan and Services Expected Discharge Plan: Home w Home Health Services   Discharge Planning Services: CM Consult Post Acute Care Choice: Home Health Living arrangements for the past 2 months: Single Family Home Expected Discharge Date: 07/23/22                         HH Arranged: PT, OT HH Agency: [facility] Date HH Agency Contacted: 07/21/22 Time HH Agency Contacted: 1635 Representative spoke with at [facility] Agency: Barbara Cower   Social Determinants of Health (SDOH) Interventions    Readmission Risk Interventions     No data to display

## 2022-07-23 NOTE — Progress Notes (Signed)
Physical Therapy Treatment Patient Details Name: Tamara [name] MRN: [id number] DOB: 1946/01/24 Today's Date: 07/23/2022   History of Present Illness Faylene Allerton is a 76 y.o. female with medical history significant for type 2 diabetes mellitus, GERD, hypertension, hypothyroidism and depression, noted to emergency room with acute onset of altered mental status and frequent falls for couple visits on 8/17 and 8/20 and today has been having worsening altered mental status with mild confusion.    PT Comments    Pt received upright in be agreeable to PT. Reports worsening of L sided low back and hip pain which is now bilateral describing referred/possible radicular symptoms in LE dermatomes. Able to progress bed mobility to minA a trunk with improved sequencing of Ue's and LE's and sanding with minguard.  Initially taking R side steps towards [facility] endorsing worsening of pain in weightbearing positions requesting seated rest. After 1-2 min seated rest pt agreeable to progress gait performing 2x7' bouts with seated rest b/t with RW. Noted antalgic gait on LLE with increased R lateral lean and weightbearing to offload LLE. Education and min VC's for safe turning practices with RW and hand placement with sitting to decreased risk of falls. Fair carryover during session. Pt and caregiver educated on safe sitting transfers to reduce risk of falls at home. Pt returning to supine with all needs in reach. D/c recs remain appropriate. Continue to rec 24/7 supervision and use of RW at home.    Recommendations for follow up therapy are one component of a multi-disciplinary discharge planning process, led by the attending physician.  Recommendations may be updated based on patient status, additional functional criteria and insurance authorization.  Follow Up Recommendations  Home health PT Can patient physically be transported by private vehicle: Yes   Assistance Recommended at Discharge Frequent or constant  Supervision/Assistance  Patient can return home with the following A lot of help with walking and/or transfers;A lot of help with bathing/dressing/bathroom;Assistance with cooking/housework;Assist for transportation;Help with stairs or ramp for entrance;Direct supervision/assist for medications management   Equipment Recommendations  Rolling walker (2 wheels)    Recommendations for Other Services       Precautions / Restrictions Precautions Precautions: Fall Restrictions Weight Bearing Restrictions: No     Mobility  Bed Mobility Overal bed mobility: Needs Assistance Bed Mobility: Supine to Sit, Sit to Supine     Supine to sit: Min assist, HOB elevated Sit to supine: Min assist   General bed mobility comments: Only needing minA today at trunk. Sequencing LE's better to EOB to sit up. Patient Response: Cooperative  Transfers Overall transfer level: Needs assistance Equipment used: Rolling walker (2 wheels) Transfers: Sit to/from Stand Sit to Stand: Min guard                Ambulation/Gait Ambulation/Gait assistance: Min guard Gait Distance (Feet): 20 Feet Assistive device: Rolling walker (2 wheels) Gait Pattern/deviations: Step-to pattern, Narrow base of support, Decreased step length - right, Decreased step length - left, Antalgic Gait velocity: slowed and cautious. Limited by pain     General Gait Details: R weigh shift today during gait to offlaod LLE due to worsening pain comapred to eval.   Stairs             Wheelchair Mobility    Modified Rankin (Stroke Patients Only)       Balance Overall balance assessment: Needs assistance Sitting-balance support: Bilateral upper extremity supported, Feet supported Sitting balance-Leahy Scale: Fair     Standing balance support: Bilateral  upper extremity supported, During functional activity Standing balance-Leahy Scale: Fair Standing balance comment: maintains standing with RW                             Cognition Arousal/Alertness: Awake/alert Behavior During Therapy: WFL for tasks assessed/performed Overall Cognitive Status: History of cognitive impairments - at baseline                                          Exercises Other Exercises Other Exercises: Use of RW for improved stability to reduce risk of falls. Importance of safe sitting techniques to prevent falls.    General Comments        Pertinent Vitals/Pain Pain Assessment Pain Assessment: Faces Faces Pain Scale: Hurts even more Pain Location: low back referring to bilat anteromedial hips in dermatomal pattern Pain Descriptors / Indicators: Cramping, Tender, Grimacing, Guarding Pain Intervention(s): Limited activity within patient's tolerance, Monitored during session, Repositioned    Home Living                          Prior Function            PT Goals (current goals can now be found in the care plan section) Acute Rehab PT Goals Patient Stated Goal: improve pain with mobility PT Goal Formulation: With patient Time For Goal Achievement: 08/05/22 Potential to Achieve Goals: Fair Progress towards PT goals: Progressing toward goals    Frequency    Min 2X/week      PT Plan Current plan remains appropriate    Co-evaluation              AM-PAC PT "6 Clicks" Mobility   Outcome Measure  Help needed turning from your back to your side while in a flat bed without using bedrails?: A Little Help needed moving from lying on your back to sitting on the side of a flat bed without using bedrails?: A Little Help needed moving to and from a bed to a chair (including a wheelchair)?: A Little Help needed standing up from a chair using your arms (e.g., wheelchair or bedside chair)?: A Little Help needed to walk in hospital room?: A Little Help needed climbing 3-5 steps with a railing? : Total 6 Click Score: 16    End of Session Equipment Utilized During Treatment:  Gait belt Activity Tolerance: Patient limited by pain Patient left: in bed;with family/visitor present (caregiver) Nurse Communication: Mobility status PT Visit Diagnosis: Unsteadiness on feet (R26.81);Other abnormalities of gait and mobility (R26.89);Repeated falls (R29.6);Muscle weakness (generalized) (M62.81);History of falling (Z91.81);Pain Pain - Right/Left: Left Pain - part of body: Hip;Leg     Time: 2778-2423 PT Time Calculation (min) (ACUTE ONLY): 23 min  Charges:  $Gait Training: 8-22 mins $Therapeutic Activity: 8-22 mins                     Daleon Willinger M. Fairly IV, PT, DPT Physical Therapist- [facility]  07/23/2022, 9:49 AM

## 2022-07-23 NOTE — Plan of Care (Signed)
  Problem: Clinical Measurements: Goal: Diagnostic test results will improve Outcome: Progressing   Problem: Clinical Measurements: Goal: Signs and symptoms of infection will decrease Outcome: Progressing   Problem: Respiratory: Goal: Ability to maintain adequate ventilation will improve Outcome: Progressing   

## 2022-07-23 NOTE — Assessment & Plan Note (Signed)
Sepsis ruled out.Urine cultures are negative. Leukocytosis resolved, may have SIR's response.

## 2022-08-15 ENCOUNTER — Emergency Department: Payer: Medicare Other

## 2022-08-15 ENCOUNTER — Emergency Department
Admission: EM | Admit: 2022-08-15 | Discharge: 2022-08-15 | Disposition: A | Discharge status: Home / Self Care | Payer: Medicare Other | Attending: Emergency Medicine | Admitting: Emergency Medicine

## 2022-08-15 DIAGNOSIS — S51012A Laceration without foreign body of left elbow, initial encounter: Secondary | ICD-10-CM | POA: Diagnosis not present

## 2022-08-15 DIAGNOSIS — E1122 Type 2 diabetes mellitus with diabetic chronic kidney disease: Secondary | ICD-10-CM | POA: Diagnosis not present

## 2022-08-15 DIAGNOSIS — R519 Headache, unspecified: Secondary | ICD-10-CM | POA: Diagnosis not present

## 2022-08-15 DIAGNOSIS — S00212A Abrasion of left eyelid and periocular area, initial encounter: Secondary | ICD-10-CM | POA: Diagnosis not present

## 2022-08-15 DIAGNOSIS — N183 Chronic kidney disease, stage 3 unspecified: Secondary | ICD-10-CM | POA: Insufficient documentation

## 2022-08-15 DIAGNOSIS — W1812XA Fall from or off toilet with subsequent striking against object, initial encounter: Secondary | ICD-10-CM | POA: Diagnosis not present

## 2022-08-15 DIAGNOSIS — S80212A Abrasion, left knee, initial encounter: Secondary | ICD-10-CM | POA: Diagnosis not present

## 2022-08-15 DIAGNOSIS — I129 Hypertensive chronic kidney disease with stage 1 through stage 4 chronic kidney disease, or unspecified chronic kidney disease: Secondary | ICD-10-CM | POA: Insufficient documentation

## 2022-08-15 DIAGNOSIS — E039 Hypothyroidism, unspecified: Secondary | ICD-10-CM | POA: Diagnosis not present

## 2022-08-15 DIAGNOSIS — E1140 Type 2 diabetes mellitus with diabetic neuropathy, unspecified: Secondary | ICD-10-CM | POA: Diagnosis not present

## 2022-08-15 DIAGNOSIS — S80211A Abrasion, right knee, initial encounter: Secondary | ICD-10-CM | POA: Insufficient documentation

## 2022-08-15 DIAGNOSIS — Y92002 Bathroom of unspecified non-institutional (private) residence single-family (private) house as the place of occurrence of the external cause: Secondary | ICD-10-CM | POA: Diagnosis not present

## 2022-08-15 DIAGNOSIS — W19XXXA Unspecified fall, initial encounter: Secondary | ICD-10-CM

## 2022-08-15 DIAGNOSIS — S59902A Unspecified injury of left elbow, initial encounter: Secondary | ICD-10-CM | POA: Diagnosis present

## 2022-08-15 MED ORDER — ACETAMINOPHEN 500 MG PO TABS
1000.0000 mg | ORAL_TABLET | Freq: Once | ORAL | Status: AC
  Administered 2022-08-15: 1000 mg via ORAL
  Filled 2022-08-15: qty 2

## 2022-08-15 NOTE — ED Provider Notes (Signed)
[facility] Provider Note    Event Date/Time   First MD Initiated Contact with Patient 08/15/22 [phone]     (approximate)   History   Fall   HPI  Latecia Ippolito is a 76 y.o. female with past medical history of hypertension diabetes GERD depression CKD anemia who presents after a fall.  Patient got up to use the bathroom was sitting on the side of her bed.  She leaned forward and then fell forward hitting her head, knees and left elbow.  She was not able to get up on her own.  Typically she walks with a walker.  She did have a headache before but is now not having headache.  Denies visual change numbness tingling weakness.  Denies neck pain.  She has chronic back pain this is not new.  Does complain of pain in the left elbow.  She otherwise denies any illnesses denies fevers chills nausea vomiting diarrhea or chest pain.     Past Medical History:  Diagnosis Date   Anemia    Chronic kidney disease    Depression    Diabetes mellitus without complication ([place])    GERD (gastroesophageal reflux disease)    Hypertension    Hypothyroidism     Patient Active Problem List   Diagnosis Date Noted   Sepsis due to gram-negative UTI ([place]) 07/21/2022   Uncontrolled type 2 diabetes mellitus with hyperglycemia, with long-term current use of insulin ([place]) 07/21/2022   Anxiety and depression 07/21/2022   GERD without esophagitis 07/21/2022   Hypothyroidism 0000000   Acute metabolic encephalopathy 0000000   Chronic back pain 07/17/2022   Hyponatremia 07/16/2022   Chronic kidney disease 05/08/2022   Depression 05/08/2022   Generalized weakness 05/08/2022   Cervical spondylosis with myelopathy 07/11/2021   Anxiety, mild 05/16/2021   Loss of memory 05/16/2021   Body mass index (BMI) 31.0-31.9, adult 04/11/2021   Hx of transient ischemic attack (TIA) 04/08/2021   Mild cognitive impairment of uncertain or unknown etiology 04/07/2021   Other spondylosis with  myelopathy, thoracic region 04/07/2021   Dysarthria 03/31/2021   Frequent falls 03/30/2021   Gastroesophageal reflux disease 11/30/2020   Long term (current) use of insulin ([place]) 11/30/2020   Obesity, unspecified 11/30/2020   Other long term (current) drug therapy 11/30/2020   Unspecified osteoarthritis, unspecified site 11/30/2020   Osteoarthritis 11/30/2020   Polycystic ovarian syndrome 11/30/2020   Pure hypercholesterolemia, unspecified 11/30/2020   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 11/30/2020   Depression, unspecified 11/30/2020   Risk for falls 08/30/2019   Essential hypertension 06/14/2019   Chronic thumb pain, left 12/02/2018   Type 2 diabetes mellitus with diabetic chronic kidney disease ([place]) 12/02/2018   Hypertension associated with type 2 diabetes mellitus ([place]) 12/02/2018   Hyperlipidemia associated with type 2 diabetes mellitus ([place]) 12/02/2018   Chronic kidney disease, stage 3 unspecified ([place]) 09/30/2017   Urinary incontinence in female 08/16/2017   Diabetic neuropathy ([place]) 06/10/2017   Fusion of spine of thoracic region 02/15/2017   Sleep apnea 02/05/2017   History of MRSA infection 02/05/2017   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   Status post lumbar spinal fusion 02/05/2017   Mixed action and resting tremor 09/24/2016   Unsteady gait 09/24/2016   Orthostatic hypotension 03/09/2016   History of cervical spinal surgery 03/08/2016   Spinal stenosis of lumbar region without neurogenic claudication 01/31/2014   Varicose veins of both lower extremities 02/17/2013  GERD (gastroesophageal reflux disease) 11/04/2012   Obesity 10/11/2012   Mixed hyperlipidemia 06/11/2011   History of colonic polyps 06/11/2011   Rotator cuff tear 06/11/2011     Physical Exam  Triage Vital Signs: ED Triage Vitals [08/15/22 0442]  Enc Vitals Group     BP 138/76     Pulse Rate 83     Resp (!) 26      Temp (!) 97.4 F (36.3 C)     Temp src      SpO2 97 %     Weight      Height      Head Circumference      Peak Flow      Pain Score      Pain Loc      Pain Edu?      Excl. in [place]?     Most recent vital signs: Vitals:   08/15/22 0442 08/15/22 0545  BP: 138/76 (!) 140/74  Pulse: 83 81  Resp: (!) 26 20  Temp: (!) 97.4 F (36.3 C)   SpO2: 97% 99%     General: Awake, no distress.  Mucous membranes are dry CV:  Good peripheral perfusion.  Resp:  Normal effort.  Abd:  No distention.  Neuro:             Awake, Alert, Oriented x 3  Other:  There is a small amount of swelling and abrasion over the left eyebrow, pulse are equal reactive extraocular movements are intact no facial tenderness or asymmetry No C-spine tenderness Chest wall is nontender Abdomen is soft nontender pelvis is stable nontender Skin tear on the left elbow, able to range, no deformity, mildly tender throughout There are superficial abrasions of the bilateral knees with patient able to range the knees there is no deformity no swelling no effusion she is able to range both hips bilaterally    ED Results / Procedures / Treatments  Labs (all labs ordered are listed, but only abnormal results are displayed) Labs Reviewed - No data to display   EKG     RADIOLOGY I reviewed and interpreted the CT scan of the brain which does not show any acute intracranial process    PROCEDURES:  Critical Care performed: No  .1-3 Lead EKG Interpretation  Performed by: Rada Hay, MD Authorized by: Rada Hay, MD     Interpretation: normal     ECG rate assessment: normal     Rhythm: sinus rhythm     Ectopy: none     Conduction: normal     The patient is on the cardiac monitor to evaluate for evidence of arrhythmia and/or significant heart rate changes.   MEDICATIONS ORDERED IN ED: Medications  acetaminophen (TYLENOL) tablet 1,000 mg (1,000 mg Oral Given 08/15/22 0531)     IMPRESSION /  MDM / ASSESSMENT AND PLAN / ED COURSE  I reviewed the triage vital signs and the nursing notes.                              Patient's presentation is most consistent with acute presentation with potential threat to life or bodily function.  Differential diagnosis includes, but is not limited to, concussion, intracranial hemorrhage, cervical spine fracture  Patient is a 76 year old female presents with a mechanical fall.  She was sitting on the edge of the bed bent over and then continue to fall forward.  She typically walks with a walker.  She did hit her head denies loss of consciousness denies current headache.  No new neurologic symptoms.  Does complain of left elbow pain.  On exam patient looks somewhat dry.  She has an abrasion over the left eyebrow that is mildly swollen and skin tear on the left elbow and abrasions on the knees.  However based on exam I am not too difficult and [place] are not for any bony injury including to the chest wall pelvis or extremities.  Will obtain CTs head and C-spine to rule out intracranial hemorrhage or cervical spine fracture.  Patient seems to be at her baseline.    Patient CT head and C-spine are without acute injury.  X-ray of the elbow does not show any fracture.  Patient appears to be at her baseline otherwise.   FINAL CLINICAL IMPRESSION(S) / ED DIAGNOSES   Final diagnoses:  Fall, initial encounter     Rx / DC Orders   ED Discharge Orders     None        Note:  This document was prepared using Dragon voice recognition software and may include unintentional dictation errors.   Rada Hay, MD 08/15/22 [phone]

## 2022-08-15 NOTE — ED Notes (Signed)
ED Provider at bedside. 

## 2022-08-15 NOTE — ED Triage Notes (Signed)
Pt fell forward while trying to put her shoes on. Pt has bruising to the L eyebrow and sts "I hurt everywhere"

## 2022-08-15 NOTE — ED Notes (Signed)
Report called to [facility]. [employer] given report

## 2022-08-15 NOTE — Discharge Instructions (Addendum)
Your CAT scan of your head and neck did not show any acute injuries.  The x-ray of your elbow did not show any broken bone.  Please follow-up with your primary care provider.  Can take Tylenol as needed for pain.

## 2022-08-15 NOTE — ED Notes (Signed)
CALLED [facility] FOR TRANSPORT TO [place] [facility]

## 2022-09-28 ENCOUNTER — Encounter (INDEPENDENT_AMBULATORY_CARE_PROVIDER_SITE_OTHER): Payer: Self-pay

## 2022-10-02 ENCOUNTER — Emergency Department: Payer: Medicare Other

## 2022-10-02 ENCOUNTER — Inpatient Hospital Stay
Admission: EM | Admit: 2022-10-02 | Discharge: 2022-10-06 | DRG: 519 | Disposition: A | Discharge status: Home / Self Care | Payer: Medicare Other | Source: Skilled Nursing Facility | Attending: Neurosurgery | Admitting: Neurosurgery

## 2022-10-02 DIAGNOSIS — Z981 Arthrodesis status: Secondary | ICD-10-CM | POA: Diagnosis not present

## 2022-10-02 DIAGNOSIS — M5116 Intervertebral disc disorders with radiculopathy, lumbar region: Secondary | ICD-10-CM | POA: Diagnosis present

## 2022-10-02 DIAGNOSIS — N189 Chronic kidney disease, unspecified: Secondary | ICD-10-CM | POA: Diagnosis present

## 2022-10-02 DIAGNOSIS — G834 Cauda equina syndrome: Secondary | ICD-10-CM | POA: Diagnosis present

## 2022-10-02 DIAGNOSIS — Z7984 Long term (current) use of oral hypoglycemic drugs: Secondary | ICD-10-CM | POA: Diagnosis not present

## 2022-10-02 DIAGNOSIS — Z833 Family history of diabetes mellitus: Secondary | ICD-10-CM

## 2022-10-02 DIAGNOSIS — M48061 Spinal stenosis, lumbar region without neurogenic claudication: Secondary | ICD-10-CM | POA: Diagnosis present

## 2022-10-02 DIAGNOSIS — F419 Anxiety disorder, unspecified: Secondary | ICD-10-CM | POA: Diagnosis present

## 2022-10-02 DIAGNOSIS — E1122 Type 2 diabetes mellitus with diabetic chronic kidney disease: Secondary | ICD-10-CM | POA: Diagnosis present

## 2022-10-02 DIAGNOSIS — R200 Anesthesia of skin: Principal | ICD-10-CM

## 2022-10-02 DIAGNOSIS — K219 Gastro-esophageal reflux disease without esophagitis: Secondary | ICD-10-CM | POA: Diagnosis present

## 2022-10-02 DIAGNOSIS — Z888 Allergy status to other drugs, medicaments and biological substances status: Secondary | ICD-10-CM

## 2022-10-02 DIAGNOSIS — Z79899 Other long term (current) drug therapy: Secondary | ICD-10-CM | POA: Diagnosis not present

## 2022-10-02 DIAGNOSIS — Z794 Long term (current) use of insulin: Secondary | ICD-10-CM

## 2022-10-02 DIAGNOSIS — Z79890 Hormone replacement therapy (postmenopausal): Secondary | ICD-10-CM

## 2022-10-02 DIAGNOSIS — I129 Hypertensive chronic kidney disease with stage 1 through stage 4 chronic kidney disease, or unspecified chronic kidney disease: Secondary | ICD-10-CM | POA: Diagnosis present

## 2022-10-02 DIAGNOSIS — E039 Hypothyroidism, unspecified: Secondary | ICD-10-CM | POA: Diagnosis present

## 2022-10-02 DIAGNOSIS — Z809 Family history of malignant neoplasm, unspecified: Secondary | ICD-10-CM

## 2022-10-02 DIAGNOSIS — F32A Depression, unspecified: Secondary | ICD-10-CM | POA: Diagnosis present

## 2022-10-02 LAB — CBC WITH DIFFERENTIAL/PLATELET
Abs Immature Granulocytes: 0.03 10*3/uL (ref 0.00–0.07)
Basophils Absolute: 0.1 10*3/uL (ref 0.0–0.1)
Basophils Relative: 1 %
Eosinophils Absolute: 0.2 10*3/uL (ref 0.0–0.5)
Eosinophils Relative: 3 %
HCT: 36.5 % (ref 36.0–46.0)
Hemoglobin: 11.7 g/dL — ABNORMAL LOW (ref 12.0–15.0)
Immature Granulocytes: 1 %
Lymphocytes Relative: 30 %
Lymphs Abs: 1.9 10*3/uL (ref 0.7–4.0)
MCH: 27.3 pg (ref 26.0–34.0)
MCHC: 32.1 g/dL (ref 30.0–36.0)
MCV: 85.3 fL (ref 80.0–100.0)
Monocytes Absolute: 0.5 10*3/uL (ref 0.1–1.0)
Monocytes Relative: 8 %
Neutro Abs: 3.6 10*3/uL (ref 1.7–7.7)
Neutrophils Relative %: 57 %
Platelets: 423 10*3/uL — ABNORMAL HIGH (ref 150–400)
RBC: 4.28 MIL/uL (ref 3.87–5.11)
RDW: 18.3 % — ABNORMAL HIGH (ref 11.5–15.5)
WBC: 6.2 10*3/uL (ref 4.0–10.5)
nRBC: 0 % (ref 0.0–0.2)

## 2022-10-02 LAB — COMPREHENSIVE METABOLIC PANEL
ALT: 21 U/L (ref 0–44)
AST: 21 U/L (ref 15–41)
Albumin: 3.8 g/dL (ref 3.5–5.0)
Alkaline Phosphatase: 95 U/L (ref 38–126)
Anion gap: 11 (ref 5–15)
BUN: 12 mg/dL (ref 8–23)
CO2: 26 mmol/L (ref 22–32)
Calcium: 9.9 mg/dL (ref 8.9–10.3)
Chloride: 105 mmol/L (ref 98–111)
Creatinine, Ser: 0.98 mg/dL (ref 0.44–1.00)
GFR, Estimated: >60 mL/min (ref >60)
Glucose, Bld: 144 mg/dL — ABNORMAL HIGH (ref 70–99)
Potassium: 4.7 mmol/L (ref 3.5–5.1)
Sodium: 142 mmol/L (ref 135–145)
Total Bilirubin: 0.6 mg/dL (ref 0.3–1.2)
Total Protein: 7.2 g/dL (ref 6.5–8.1)

## 2022-10-02 LAB — GLUCOSE, CAPILLARY: Glucose-Capillary: 115 mg/dL — ABNORMAL HIGH (ref 70–99)

## 2022-10-02 MED ORDER — IRBESARTAN 150 MG PO TABS
75.0000 mg | ORAL_TABLET | Freq: Every day | ORAL | Status: DC
  Administered 2022-10-04 – 2022-10-06 (×3): 75 mg via ORAL
  Filled 2022-10-02 (×3): qty 1

## 2022-10-02 MED ORDER — LEVOTHYROXINE SODIUM 112 MCG PO TABS
112.0000 ug | ORAL_TABLET | Freq: Every day | ORAL | Status: DC
  Administered 2022-10-03 – 2022-10-06 (×4): 112 ug via ORAL
  Filled 2022-10-02 (×4): qty 1

## 2022-10-02 MED ORDER — PANTOPRAZOLE SODIUM 40 MG PO TBEC
40.0000 mg | DELAYED_RELEASE_TABLET | Freq: Every day | ORAL | Status: DC
  Administered 2022-10-04 – 2022-10-06 (×3): 40 mg via ORAL
  Filled 2022-10-02 (×3): qty 1

## 2022-10-02 MED ORDER — LAMOTRIGINE 25 MG PO TABS
150.0000 mg | ORAL_TABLET | Freq: Two times a day (BID) | ORAL | Status: DC
  Administered 2022-10-02 – 2022-10-06 (×7): 150 mg via ORAL
  Filled 2022-10-02 (×7): qty 6

## 2022-10-02 MED ORDER — ONDANSETRON HCL 4 MG PO TABS: 4.0000 mg | ORAL_TABLET | Freq: Four times a day (QID) | ORAL | Status: DC | PRN

## 2022-10-02 MED ORDER — ACETAMINOPHEN 650 MG RE SUPP: 650.0000 mg | RECTAL | Status: DC | PRN

## 2022-10-02 MED ORDER — GADOBUTROL 1 MMOL/ML IV SOLN
7.0000 mL | Freq: Once | INTRAVENOUS | Status: AC | PRN
  Administered 2022-10-02: 7 mL via INTRAVENOUS

## 2022-10-02 MED ORDER — SODIUM CHLORIDE 0.9 % IV SOLN: INTRAVENOUS | Status: DC

## 2022-10-02 MED ORDER — ACETAMINOPHEN 325 MG PO TABS
650.0000 mg | ORAL_TABLET | ORAL | Status: DC | PRN
  Administered 2022-10-02: 650 mg via ORAL
  Filled 2022-10-02: qty 2

## 2022-10-02 MED ORDER — HYDROCODONE-ACETAMINOPHEN 5-325 MG PO TABS
1.0000 | ORAL_TABLET | ORAL | Status: DC | PRN
  Administered 2022-10-03: 1 via ORAL
  Filled 2022-10-02: qty 1

## 2022-10-02 MED ORDER — INSULIN GLARGINE-YFGN 100 UNIT/ML ~~LOC~~ SOLN
10.0000 [IU] | Freq: Every day | SUBCUTANEOUS | Status: DC
  Administered 2022-10-03: 10 [IU] via SUBCUTANEOUS
  Filled 2022-10-02: qty 0.1

## 2022-10-02 MED ORDER — ATORVASTATIN CALCIUM 20 MG PO TABS
20.0000 mg | ORAL_TABLET | Freq: Every day | ORAL | Status: DC
  Administered 2022-10-02 – 2022-10-05 (×4): 20 mg via ORAL
  Filled 2022-10-02 (×4): qty 1

## 2022-10-02 MED ORDER — CALCITONIN (SALMON) 200 UNIT/ACT NA SOLN
1.0000 | Freq: Every day | NASAL | Status: DC
  Administered 2022-10-03 – 2022-10-06 (×2): 1 via NASAL
  Filled 2022-10-02: qty 3.7

## 2022-10-02 MED ORDER — TRAMADOL HCL 50 MG PO TABS
50.0000 mg | ORAL_TABLET | Freq: Once | ORAL | Status: AC
  Administered 2022-10-02: 50 mg via ORAL
  Filled 2022-10-02: qty 1

## 2022-10-02 MED ORDER — KETOROLAC TROMETHAMINE 15 MG/ML IJ SOLN
15.0000 mg | Freq: Once | INTRAMUSCULAR | Status: AC
  Administered 2022-10-02: 15 mg via INTRAVENOUS
  Filled 2022-10-02: qty 1

## 2022-10-02 MED ORDER — MAGNESIUM OXIDE 400 MG PO TABS
800.0000 mg | ORAL_TABLET | Freq: Every day | ORAL | Status: DC
  Administered 2022-10-04 – 2022-10-06 (×3): 800 mg via ORAL
  Filled 2022-10-02 (×7): qty 2

## 2022-10-02 MED ORDER — ONDANSETRON HCL 4 MG/2ML IJ SOLN: 4.0000 mg | Freq: Four times a day (QID) | INTRAMUSCULAR | Status: DC | PRN

## 2022-10-02 MED ORDER — LORAZEPAM 2 MG/ML IJ SOLN
1.0000 mg | Freq: Once | INTRAMUSCULAR | Status: AC
  Administered 2022-10-02: 1 mg via INTRAVENOUS
  Filled 2022-10-02: qty 1

## 2022-10-02 MED ORDER — TIZANIDINE HCL 2 MG PO TABS
1.0000 mg | ORAL_TABLET | Freq: Every evening | ORAL | Status: DC | PRN
  Administered 2022-10-04: 1 mg via ORAL
  Filled 2022-10-02 (×2): qty 0.5

## 2022-10-02 MED ORDER — PHENOL 1.4 % MT LIQD: 1.0000 | OROMUCOSAL | Status: DC | PRN

## 2022-10-02 MED ORDER — FLUTICASONE PROPIONATE 50 MCG/ACT NA SUSP
1.0000 | Freq: Two times a day (BID) | NASAL | Status: DC
  Administered 2022-10-03 – 2022-10-06 (×5): 1 via NASAL
  Filled 2022-10-02: qty 16

## 2022-10-02 MED ORDER — BUSPIRONE HCL 10 MG PO TABS
10.0000 mg | ORAL_TABLET | Freq: Two times a day (BID) | ORAL | Status: DC
  Administered 2022-10-02 – 2022-10-06 (×7): 10 mg via ORAL
  Filled 2022-10-02 (×7): qty 1

## 2022-10-02 MED ORDER — SENNA 8.6 MG PO TABS
1.0000 | ORAL_TABLET | Freq: Two times a day (BID) | ORAL | Status: DC
  Administered 2022-10-02: 8.6 mg via ORAL
  Filled 2022-10-02: qty 1

## 2022-10-02 MED ORDER — BUPROPION HCL ER (XL) 150 MG PO TB24
450.0000 mg | ORAL_TABLET | Freq: Every day | ORAL | Status: DC
  Administered 2022-10-04 – 2022-10-06 (×3): 450 mg via ORAL
  Filled 2022-10-02 (×4): qty 3

## 2022-10-02 MED ORDER — DICLOFENAC SODIUM 1 % EX GEL
2.0000 g | Freq: Four times a day (QID) | CUTANEOUS | Status: DC
  Administered 2022-10-03 – 2022-10-06 (×6): 2 g via TOPICAL
  Filled 2022-10-02: qty 100

## 2022-10-02 NOTE — ED Notes (Signed)
MRI at ~ 1145

## 2022-10-02 NOTE — ED Provider Notes (Signed)
[facility] Provider Note    Event Date/Time   First MD Initiated Contact with Patient 10/02/22 0615     (approximate)   History   Numbness   HPI  Tamara [name] is a 76 y.o. female who presents to the emergency department today from living facility because of concerns for right hip pain and leg numbness.  Patient states that the symptoms started when she woke up today.  She had a normal day yesterday.  She denies any falls or unusual activities.  The pain extends on the lateral aspect of her right hip down towards her right knee.  The numbness is in both legs from essentially the knees down.  She states she still has some sensation and just feels heavy and off.  She denies any back pain or change in urination or defecation.     Physical Exam   Triage Vital Signs: ED Triage Vitals  Enc Vitals Group     BP 10/02/22 0556 (!) 152/88     Pulse Rate 10/02/22 0556 75     Resp 10/02/22 0556 18     Temp 10/02/22 0556 (!) 97.3 F (36.3 C)     Temp Source 10/02/22 0556 Oral     SpO2 10/02/22 0556 99 %     Weight 10/02/22 0555 160 lb (72.6 kg)     Height 10/02/22 0555 5\' 7"  (1.702 m)     Head Circumference --      Peak Flow --      Pain Score 10/02/22 0555 10     Pain Loc --      Pain Edu? --      Excl. in [place]? --     Most recent vital signs: Vitals:   10/02/22 0556  BP: (!) 152/88  Pulse: 75  Resp: 18  Temp: (!) 97.3 F (36.3 C)  SpO2: 99%   General: Awake, alert, oriented. CV:  Good peripheral perfusion. Regular rate and rhythm. Resp:  Normal effort. Lungs clear. Abd:  No distention.  Other:  Tender to palpation of the right hip. Subjective numbness to bilateral lower legs. DP 2+ in bilateral legs. No swelling or discoloration.   ED Results / Procedures / Treatments   Labs (all labs ordered are listed, but only abnormal results are displayed) Labs Reviewed  CBC WITH DIFFERENTIAL/PLATELET - Abnormal; Notable for the following components:       Result Value   Hemoglobin 11.7 (*)    RDW 18.3 (*)    Platelets 423 (*)    All other components within normal limits  COMPREHENSIVE METABOLIC PANEL - Abnormal; Notable for the following components:   Glucose, Bld 144 (*)    All other components within normal limits     EKG  I, Nance Pear, attending physician, personally viewed and interpreted this EKG  EKG Time: 0600 Rate: 94 Rhythm: sinus rhythm Axis: normal Intervals: qtc 471 QRS: narrow ST changes: no st elevation Impression: normal ekg   RADIOLOGY I independently interpreted and visualized the right hip. My interpretation: No acute fracture Radiology interpretation:  IMPRESSION:  No acute finding or change since August 2023.     PROCEDURES:  Critical Care performed: No  Procedures   MEDICATIONS ORDERED IN ED: Medications - No data to display   IMPRESSION / MDM / [place] / ED COURSE  I reviewed the triage vital signs and the nursing notes.  Differential diagnosis includes, but is not limited to, fracture, dislocation, cauda equina, nerve compression  Patient's presentation is most consistent with acute presentation with potential threat to life or bodily function.  Patient presented to the emergency department today because of concerns for right hip pain and bilateral leg pain.  Patient is nontender on her spine on exam here.  She denies any urinary continence or bowel habit changes.  However given nature of bilateral numbness we will get MRI to evaluate lumbar spine.  X-ray of the hip did not show any acute fracture or dislocation.  Awaiting MRI at time of signout.  FINAL CLINICAL IMPRESSION(S) / ED DIAGNOSES   Right hip pain Leg numbness  Note:  This document was prepared using Dragon voice recognition software and may include unintentional dictation errors.    Phineas Semen, MD 10/02/22 [phone]

## 2022-10-02 NOTE — ED Notes (Signed)
Attempted to call Neena Rhymes, care manager, at number listed in chart to update status and treatment plan. No answer at this time.

## 2022-10-02 NOTE — ED Notes (Addendum)
Pending [place] consultation, Dr. Lacinda Axon. Care Manager at [facility], Neena Rhymes [phone]. Son lives in [place], Majesty Stehlin [phone].

## 2022-10-02 NOTE — ED Triage Notes (Signed)
To room 13 from [facility] via [facility] with c/o hip and leg pain to right side. Pt also reports bilateral leg numbness and neck pain.  Pt alert and oriented upon arrival. Denies injuries. NIH 0

## 2022-10-02 NOTE — H&P (Signed)
Neurosurgery-History and Physical 10/02/2022 Tamara [name] [id number]  Identifying Statement: Tamara [name] is a 76 y.o. female from [place] [id number] with leg pain and numbness  History of Present Illness: Tamara [name] is here for evaluation of new right leg pain and numbness in legs. She notes this started this morning. She does not note any weakness and has had no bowel or bladder changes. She has a history of multiple spine surgeries, last in 2018. She states she walks daily and does not commonly have pain. She came to ED and MRI of the lumbar spine was obtained showing a large disc herniation. She states the right leg pain is most bothersome.   Past Medical History:  Past Medical History:  Diagnosis Date   Anemia    Chronic kidney disease    Depression    Diabetes mellitus without complication (HCC)    GERD (gastroesophageal reflux disease)    Hypertension    Hypothyroidism     Social History: Social History   Socioeconomic History   Marital status: Married    Spouse name: Not on file   Number of children: Not on file   Years of education: Not on file   Highest education level: Not on file  Occupational History   Not on file  Tobacco Use   Smoking status: Never   Smokeless tobacco: Never  Vaping Use   Vaping Use: Never used  Substance and Sexual Activity   Alcohol use: No   Drug use: No   Sexual activity: Not on file  Other Topics Concern   Not on file  Social History Narrative   Not on file   Social Determinants of Health   Financial Resource Strain: Not on file  Food Insecurity: Not on file  Transportation Needs: Not on file  Physical Activity: Not on file  Stress: Not on file  Social Connections: Not on file  Intimate Partner Violence: Not on file   Family History: Family History  Adopted: Yes  Problem Relation Age of Onset   Cancer Mother    Diabetes Mother     Review of Systems:  Review of Systems - General ROS: Negative Psychological ROS:  Negative Ophthalmic ROS: Negative ENT ROS: Negative Hematological and Lymphatic ROS: Negative  Endocrine ROS: Negative Respiratory ROS: Negative Cardiovascular ROS: Negative Gastrointestinal ROS: Negative Genito-Urinary ROS: Negative Musculoskeletal ROS: Negative Neurological ROS: Positive for leg pain and numbness Dermatological ROS: Negative  Physical Exam: BP (!) 178/100   Pulse 89   Temp 98.4 F (36.9 C) (Oral)   Resp 19   Ht 5\' 7"  (1.702 m)   Wt 72.6 kg   SpO2 100%   BMI 25.06 kg/m  Body mass index is 25.06 kg/m. Body surface area is 1.85 meters squared. General appearance: Alert, cooperative, in no acute distress Head: Normocephalic, atraumatic Eyes: Normal, EOM intact Oropharynx: Moist without lesions Ext: No edema in LE bilaterally  Neurologic exam:  Mental status: alertness: alert, affect: normal Speech: fluent and clear Motor:strength symmetric 5/5 in bilateral hip flexion, knee flexion/extension, dorsi/plantar flexion Sensory: intact to light touch in lower extremities with mild decrease below knees bilaterally  Laboratory: Results for orders placed or performed during the hospital encounter of 10/02/22  CBC with Differential  Result Value Ref Range   WBC 6.2 4.0 - 10.5 K/uL   RBC 4.28 3.87 - 5.11 MIL/uL   Hemoglobin 11.7 (L) 12.0 - 15.0 g/dL   HCT 36.5 36.0 - 46.0 %   MCV 85.3 80.0 - 100.0 fL  MCH 27.3 26.0 - 34.0 pg   MCHC 32.1 30.0 - 36.0 g/dL   RDW 07.8 (H) 67.5 - 44.9 %   Platelets 423 (H) 150 - 400 K/uL   nRBC 0.0 0.0 - 0.2 %   Neutrophils Relative % 57 %   Neutro Abs 3.6 1.7 - 7.7 K/uL   Lymphocytes Relative 30 %   Lymphs Abs 1.9 0.7 - 4.0 K/uL   Monocytes Relative 8 %   Monocytes Absolute 0.5 0.1 - 1.0 K/uL   Eosinophils Relative 3 %   Eosinophils Absolute 0.2 0.0 - 0.5 K/uL   Basophils Relative 1 %   Basophils Absolute 0.1 0.0 - 0.1 K/uL   Immature Granulocytes 1 %   Abs Immature Granulocytes 0.03 0.00 - 0.07 K/uL  Comprehensive  metabolic panel  Result Value Ref Range   Sodium 142 135 - 145 mmol/L   Potassium 4.7 3.5 - 5.1 mmol/L   Chloride 105 98 - 111 mmol/L   CO2 26 22 - 32 mmol/L   Glucose, Bld 144 (H) 70 - 99 mg/dL   BUN 12 8 - 23 mg/dL   Creatinine, Ser 2.01 0.44 - 1.00 mg/dL   Calcium 9.9 8.9 - 00.7 mg/dL   Total Protein 7.2 6.5 - 8.1 g/dL   Albumin 3.8 3.5 - 5.0 g/dL   AST 21 15 - 41 U/L   ALT 21 0 - 44 U/L   Alkaline Phosphatase 95 38 - 126 U/L   Total Bilirubin 0.6 0.3 - 1.2 mg/dL   GFR, Estimated >12 >19 mL/min   Anion gap 11 5 - 15   I personally reviewed labs  Imaging: MRI Lumbar Spine: 1. Advanced degeneration with retrolisthesis at T12-L1 to L2-3. 2. Partially cystic structure posterior to the L2 body on the right, likely cystic degeneration of a disc extrusion. This and associated degenerative changes cause severe spinal stenosis at L1-2 and L2-3; the conus terminates at this level and is compressed.  I personally reviewed radiology studies   Impression/Plan:  I have discussed with patient the severe stenosis and symptoms. I do recommend surgery for decompression. We discussed a laminectomy and risks associated including bleeding, infection, CSF, leak, weakness, numbness, and anesthesia. She had last meal at 6pm and we will plan for surgery in AM.    1.  Diagnosis: Lumbar Stenosis   2.  Plan - Plan for right side laminectomy and decompression

## 2022-10-02 NOTE — ED Provider Notes (Signed)
-----------------------------------------   3:48 PM on 10/02/2022 -----------------------------------------  MRI showed the following findings:  IMPRESSION:  1. Advanced degeneration with retrolisthesis at T12-L1 to L2-3.  2. Partially cystic structure posterior to the L2 body on the right,  likely cystic degeneration of a disc extrusion. This and associated  degenerative changes cause severe spinal stenosis at L1-2 and L2-3;  the conus terminates at this level and is compressed.  3. Foraminal impingement at T12-L1 to L2-3.  4. Solid fusion at L3-S1.   On reassessment, the patient reported continued pain but has no motor weakness.  She does still have numbness going down both legs and pain on the right side.  I consulted Dr. Lacinda Axon from neurology who reviewed the images.  He feels that the patient would be a good candidate for potential surgery so he will come to evaluate her.  The patient herself states she would prefer to have surgery with a surgeon who has operated on her previously although agrees to talk to Dr. Lacinda Axon about the plan.  I have signed her out to the oncoming ED physician Dr. Quentin Cornwall.   Arta Silence, MD 10/02/22 1550

## 2022-10-02 NOTE — ED Notes (Signed)
Request made for transport to the floor ?

## 2022-10-02 NOTE — ED Notes (Signed)
Pt used call bell to request female nurse assist with her purewick as stated felt like it moved out of place. Pt's briefs soaked with urine. Bed linens changed, briefs changed and purewick reapplied. Non-slip socks placed on pt's feet. Pt assisted to switch from personal clothes to gown as personal clothes were wet from urine. Stretcher locked low, rails up, call bell within reach.

## 2022-10-02 NOTE — ED Notes (Addendum)
Dr. Lacinda Axon returned page, still in [place], will see ~ 2 hrs, ~1945. Pt and personal care manager updated.

## 2022-10-03 ENCOUNTER — Other Ambulatory Visit: Payer: Self-pay

## 2022-10-03 ENCOUNTER — Encounter: Payer: Self-pay | Admitting: Neurosurgery

## 2022-10-03 ENCOUNTER — Inpatient Hospital Stay: Payer: Medicare Other | Admitting: Certified Registered"

## 2022-10-03 ENCOUNTER — Inpatient Hospital Stay: Payer: Medicare Other

## 2022-10-03 ENCOUNTER — Encounter
Admission: EM | Disposition: A | Discharge status: Home / Self Care | Payer: Self-pay | Source: Skilled Nursing Facility | Attending: Neurosurgery

## 2022-10-03 DIAGNOSIS — M5116 Intervertebral disc disorders with radiculopathy, lumbar region: Secondary | ICD-10-CM

## 2022-10-03 DIAGNOSIS — G834 Cauda equina syndrome: Secondary | ICD-10-CM | POA: Diagnosis not present

## 2022-10-03 DIAGNOSIS — R200 Anesthesia of skin: Secondary | ICD-10-CM

## 2022-10-03 HISTORY — PX: LUMBAR LAMINECTOMY/DECOMPRESSION MICRODISCECTOMY: SHX5026

## 2022-10-03 LAB — CBC
HCT: 34.9 % — ABNORMAL LOW (ref 36.0–46.0)
Hemoglobin: 11.4 g/dL — ABNORMAL LOW (ref 12.0–15.0)
MCH: 27.8 pg (ref 26.0–34.0)
MCHC: 32.7 g/dL (ref 30.0–36.0)
MCV: 85.1 fL (ref 80.0–100.0)
Platelets: 374 10*3/uL (ref 150–400)
RBC: 4.1 MIL/uL (ref 3.87–5.11)
RDW: 17.9 % — ABNORMAL HIGH (ref 11.5–15.5)
WBC: 11.1 10*3/uL — ABNORMAL HIGH (ref 4.0–10.5)
nRBC: 0 % (ref 0.0–0.2)

## 2022-10-03 LAB — CREATININE, SERUM
Creatinine, Ser: 0.89 mg/dL (ref 0.44–1.00)
GFR, Estimated: >60 mL/min (ref >60)

## 2022-10-03 LAB — GLUCOSE, CAPILLARY
Glucose-Capillary: 171 mg/dL — ABNORMAL HIGH (ref 70–99)
Glucose-Capillary: 120 mg/dL — ABNORMAL HIGH (ref 70–99)

## 2022-10-03 LAB — SURGICAL PCR SCREEN
MRSA, PCR: NEGATIVE
Staphylococcus aureus: POSITIVE — AB

## 2022-10-03 SURGERY — LUMBAR LAMINECTOMY/DECOMPRESSION MICRODISCECTOMY 1 LEVEL
Anesthesia: General | Laterality: Right

## 2022-10-03 MED ORDER — PHENYLEPHRINE HCL-NACL 20-0.9 MG/250ML-% IV SOLN
INTRAVENOUS | Status: AC
  Filled 2022-10-03: qty 250

## 2022-10-03 MED ORDER — NALBUPHINE HCL 10 MG/ML IJ SOLN: 10.0000 mg | Freq: Four times a day (QID) | INTRAMUSCULAR | Status: AC | PRN

## 2022-10-03 MED ORDER — ENOXAPARIN SODIUM 40 MG/0.4ML IJ SOSY
40.0000 mg | PREFILLED_SYRINGE | INTRAMUSCULAR | Status: DC
  Administered 2022-10-04 – 2022-10-06 (×3): 40 mg via SUBCUTANEOUS
  Filled 2022-10-03 (×3): qty 0.4

## 2022-10-03 MED ORDER — MIDAZOLAM HCL 2 MG/2ML IJ SOLN
INTRAMUSCULAR | Status: DC | PRN
  Administered 2022-10-03: 2 mg via INTRAVENOUS

## 2022-10-03 MED ORDER — 0.9 % SODIUM CHLORIDE (POUR BTL) OPTIME
TOPICAL | Status: DC | PRN
  Administered 2022-10-03: 500 mL

## 2022-10-03 MED ORDER — DIPHENHYDRAMINE HCL 50 MG/ML IJ SOLN
INTRAMUSCULAR | Status: AC
  Filled 2022-10-03: qty 1

## 2022-10-03 MED ORDER — LACTATED RINGERS IV SOLN: INTRAVENOUS | Status: DC | PRN

## 2022-10-03 MED ORDER — HYDROCODONE-ACETAMINOPHEN 5-325 MG PO TABS
1.0000 | ORAL_TABLET | ORAL | Status: DC | PRN
  Administered 2022-10-03 – 2022-10-04 (×3): 1 via ORAL
  Filled 2022-10-03 (×3): qty 1

## 2022-10-03 MED ORDER — CEFAZOLIN SODIUM-DEXTROSE 2-3 GM-%(50ML) IV SOLR
INTRAVENOUS | Status: DC | PRN
  Administered 2022-10-03: 2 g via INTRAVENOUS

## 2022-10-03 MED ORDER — PHENYLEPHRINE HCL-NACL 20-0.9 MG/250ML-% IV SOLN
INTRAVENOUS | Status: DC | PRN
  Administered 2022-10-03: 25 ug/min via INTRAVENOUS

## 2022-10-03 MED ORDER — LIDOCAINE HCL (CARDIAC) PF 100 MG/5ML IV SOSY
PREFILLED_SYRINGE | INTRAVENOUS | Status: DC | PRN
  Administered 2022-10-03: 100 mg via INTRAVENOUS

## 2022-10-03 MED ORDER — SENNA 8.6 MG PO TABS
1.0000 | ORAL_TABLET | Freq: Two times a day (BID) | ORAL | Status: DC
  Administered 2022-10-04 – 2022-10-06 (×5): 8.6 mg via ORAL
  Filled 2022-10-03 (×6): qty 1

## 2022-10-03 MED ORDER — SODIUM CHLORIDE 0.9 % IV SOLN: INTRAVENOUS | Status: DC | PRN

## 2022-10-03 MED ORDER — SUGAMMADEX SODIUM 500 MG/5ML IV SOLN
INTRAVENOUS | Status: DC | PRN
  Administered 2022-10-03: 300 mg via INTRAVENOUS

## 2022-10-03 MED ORDER — DIPHENHYDRAMINE HCL 50 MG/ML IJ SOLN
12.5000 mg | Freq: Once | INTRAMUSCULAR | Status: AC
  Administered 2022-10-03: 12.5 mg via INTRAVENOUS

## 2022-10-03 MED ORDER — PROPOFOL 10 MG/ML IV BOLUS
INTRAVENOUS | Status: DC | PRN
  Administered 2022-10-03: 150 mg via INTRAVENOUS

## 2022-10-03 MED ORDER — ACETAMINOPHEN 325 MG PO TABS
650.0000 mg | ORAL_TABLET | ORAL | Status: DC | PRN
  Administered 2022-10-04: 650 mg via ORAL
  Filled 2022-10-03: qty 2

## 2022-10-03 MED ORDER — SODIUM CHLORIDE 0.9% FLUSH
3.0000 mL | INTRAVENOUS | Status: DC | PRN
  Administered 2022-10-03: 3 mL via INTRAVENOUS

## 2022-10-03 MED ORDER — FENTANYL CITRATE (PF) 100 MCG/2ML IJ SOLN
INTRAMUSCULAR | Status: AC
  Administered 2022-10-03: 50 ug via INTRAVENOUS
  Filled 2022-10-03: qty 2

## 2022-10-03 MED ORDER — MENTHOL 3 MG MT LOZG: 1.0000 | LOZENGE | OROMUCOSAL | Status: DC | PRN

## 2022-10-03 MED ORDER — PHENOL 1.4 % MT LIQD: 1.0000 | OROMUCOSAL | Status: DC | PRN

## 2022-10-03 MED ORDER — PHENYLEPHRINE HCL (PRESSORS) 10 MG/ML IV SOLN
INTRAVENOUS | Status: DC | PRN
  Administered 2022-10-03: 80 ug via INTRAVENOUS
  Administered 2022-10-03 (×4): 160 ug via INTRAVENOUS
  Administered 2022-10-03 (×2): 80 ug via INTRAVENOUS
  Administered 2022-10-03: 40 ug via INTRAVENOUS
  Administered 2022-10-03: 80 ug via INTRAVENOUS

## 2022-10-03 MED ORDER — DEXAMETHASONE SODIUM PHOSPHATE 10 MG/ML IJ SOLN
INTRAMUSCULAR | Status: DC | PRN
  Administered 2022-10-03: 10 mg via INTRAVENOUS

## 2022-10-03 MED ORDER — ROCURONIUM BROMIDE 10 MG/ML (PF) SYRINGE
PREFILLED_SYRINGE | INTRAVENOUS | Status: AC
  Filled 2022-10-03: qty 10

## 2022-10-03 MED ORDER — ACETAMINOPHEN 650 MG RE SUPP: 650.0000 mg | RECTAL | Status: DC | PRN

## 2022-10-03 MED ORDER — PHENYLEPHRINE 80 MCG/ML (10ML) SYRINGE FOR IV PUSH (FOR BLOOD PRESSURE SUPPORT): PREFILLED_SYRINGE | INTRAVENOUS | Status: DC | PRN

## 2022-10-03 MED ORDER — FENTANYL CITRATE (PF) 100 MCG/2ML IJ SOLN
25.0000 ug | INTRAMUSCULAR | Status: DC | PRN
  Administered 2022-10-03: 50 ug via INTRAVENOUS

## 2022-10-03 MED ORDER — ONDANSETRON HCL 4 MG/2ML IJ SOLN
4.0000 mg | Freq: Four times a day (QID) | INTRAMUSCULAR | Status: DC | PRN
  Administered 2022-10-06: 4 mg via INTRAVENOUS
  Filled 2022-10-03: qty 2

## 2022-10-03 MED ORDER — MIDAZOLAM HCL 2 MG/2ML IJ SOLN
INTRAMUSCULAR | Status: AC
  Filled 2022-10-03: qty 2

## 2022-10-03 MED ORDER — SODIUM CHLORIDE 0.9% FLUSH
3.0000 mL | Freq: Two times a day (BID) | INTRAVENOUS | Status: DC
  Administered 2022-10-03 – 2022-10-04 (×2): 3 mL via INTRAVENOUS

## 2022-10-03 MED ORDER — NALBUPHINE HCL 10 MG/ML IJ SOLN
10.0000 mg | Freq: Once | INTRAMUSCULAR | Status: AC
  Administered 2022-10-03: 10 mg via INTRAVENOUS
  Filled 2022-10-03 (×3): qty 1

## 2022-10-03 MED ORDER — FENTANYL CITRATE (PF) 100 MCG/2ML IJ SOLN
INTRAMUSCULAR | Status: AC
  Filled 2022-10-03: qty 2

## 2022-10-03 MED ORDER — BUPIVACAINE-EPINEPHRINE (PF) 0.5% -1:200000 IJ SOLN
INTRAMUSCULAR | Status: AC
  Filled 2022-10-03: qty 30

## 2022-10-03 MED ORDER — SODIUM CHLORIDE 0.9 % IV SOLN: 250.0000 mL | INTRAVENOUS | Status: DC

## 2022-10-03 MED ORDER — ONDANSETRON HCL 4 MG PO TABS: 4.0000 mg | ORAL_TABLET | Freq: Four times a day (QID) | ORAL | Status: DC | PRN

## 2022-10-03 MED ORDER — BUPIVACAINE-EPINEPHRINE (PF) 0.5% -1:200000 IJ SOLN
INTRAMUSCULAR | Status: DC | PRN
  Administered 2022-10-03: 1 mL via PERINEURAL

## 2022-10-03 MED ORDER — FENTANYL CITRATE (PF) 100 MCG/2ML IJ SOLN
INTRAMUSCULAR | Status: DC | PRN
  Administered 2022-10-03: 37.5 ug via INTRAVENOUS
  Administered 2022-10-03: 50 ug via INTRAVENOUS
  Administered 2022-10-03: 12.5 ug via INTRAVENOUS

## 2022-10-03 MED ORDER — OXYCODONE HCL 5 MG PO TABS
5.0000 mg | ORAL_TABLET | Freq: Once | ORAL | Status: AC
  Administered 2022-10-03: 5 mg via ORAL

## 2022-10-03 MED ORDER — OXYCODONE HCL 5 MG PO TABS
ORAL_TABLET | ORAL | Status: AC
  Filled 2022-10-03: qty 1

## 2022-10-03 MED ORDER — METHYLPREDNISOLONE ACETATE 40 MG/ML IJ SUSP
INTRAMUSCULAR | Status: AC
  Filled 2022-10-03: qty 1

## 2022-10-03 MED ORDER — MUPIROCIN 2 % EX OINT
1.0000 | TOPICAL_OINTMENT | Freq: Two times a day (BID) | CUTANEOUS | Status: DC
  Administered 2022-10-03 (×2): 1 via NASAL
  Filled 2022-10-03: qty 22

## 2022-10-03 MED ORDER — MORPHINE SULFATE (PF) 2 MG/ML IV SOLN
2.0000 mg | INTRAVENOUS | Status: DC | PRN
  Administered 2022-10-03: 2 mg via INTRAVENOUS
  Filled 2022-10-03: qty 1

## 2022-10-03 MED ORDER — PROPOFOL 10 MG/ML IV BOLUS
INTRAVENOUS | Status: AC
  Filled 2022-10-03: qty 20

## 2022-10-03 MED ORDER — SURGIFLO WITH THROMBIN (HEMOSTATIC MATRIX KIT) OPTIME
TOPICAL | Status: DC | PRN
  Administered 2022-10-03: 1 via TOPICAL

## 2022-10-03 MED ORDER — ROCURONIUM BROMIDE 100 MG/10ML IV SOLN
INTRAVENOUS | Status: DC | PRN
  Administered 2022-10-03 (×2): 50 mg via INTRAVENOUS

## 2022-10-03 MED ORDER — ONDANSETRON HCL 4 MG/2ML IJ SOLN: 4.0000 mg | Freq: Once | INTRAMUSCULAR | Status: DC | PRN

## 2022-10-03 MED ORDER — ONDANSETRON HCL 4 MG/2ML IJ SOLN
INTRAMUSCULAR | Status: DC | PRN
  Administered 2022-10-03: 4 mg via INTRAVENOUS

## 2022-10-03 SURGICAL SUPPLY — 55 items
BUR MATCHSTICK NEURO 3.0 LAGG (BURR) IMPLANT
CUP MEDICINE 2OZ PLAST GRAD ST (MISCELLANEOUS) ×1 IMPLANT
DERMABOND ADVANCED .7 DNX12 (GAUZE/BANDAGES/DRESSINGS) ×1 IMPLANT
DRAPE C ARM PK CFD 31 SPINE (DRAPES) IMPLANT
DRAPE C-ARM 42X72 X-RAY (DRAPES) ×2 IMPLANT
DRAPE LAPAROTOMY 100X77 ABD (DRAPES) ×1 IMPLANT
DRAPE MICROSCOPE SPINE 48X150 (DRAPES) ×1 IMPLANT
DRAPE SURG 17X11 SM STRL (DRAPES) ×1 IMPLANT
DRSG OPSITE POSTOP 4X6 (GAUZE/BANDAGES/DRESSINGS) IMPLANT
DRSG TEGADERM 4X4.75 (GAUZE/BANDAGES/DRESSINGS) IMPLANT
DRSG TELFA 3X4 N-ADH STERILE (GAUZE/BANDAGES/DRESSINGS) IMPLANT
DURASEAL APPLICATOR TIP (TIP) IMPLANT
DURASEAL SPINE SEALANT 3ML (MISCELLANEOUS) IMPLANT
ELECT CAUTERY BLADE TIP 2.5 (TIP) ×1
ELECT EZSTD 165MM 6.5IN (MISCELLANEOUS) ×1
ELECT REM PT RETURN 9FT ADLT (ELECTROSURGICAL) ×1
ELECTRODE CAUTERY BLDE TIP 2.5 (TIP) ×1 IMPLANT
ELECTRODE EZSTD 165MM 6.5IN (MISCELLANEOUS) ×1 IMPLANT
ELECTRODE REM PT RTRN 9FT ADLT (ELECTROSURGICAL) ×1 IMPLANT
GAUZE 4X4 16PLY ~~LOC~~+RFID DBL (SPONGE) ×1 IMPLANT
GAUZE SPONGE 4X4 12PLY STRL (GAUZE/BANDAGES/DRESSINGS) ×1 IMPLANT
GLOVE BIOGEL PI IND STRL 6.5 (GLOVE) ×2 IMPLANT
GLOVE BIOGEL PI IND STRL 8 (GLOVE) ×1 IMPLANT
GLOVE SURG SYN 6.5 ES PF (GLOVE) ×2 IMPLANT
GLOVE SURG SYN 6.5 PF PI (GLOVE) ×2 IMPLANT
GLOVE SURG SYN 8.0 (GLOVE) ×2 IMPLANT
GLOVE SURG SYN 8.0 PF PI (GLOVE) ×2 IMPLANT
GOWN SRG LRG LVL 4 IMPRV REINF (GOWNS) ×2 IMPLANT
GOWN STRL REIN LRG LVL4 (GOWNS) ×2
GOWN STRL REUS W/ TWL XL LVL3 (GOWN DISPOSABLE) ×2 IMPLANT
GOWN STRL REUS W/TWL XL LVL3 (GOWN DISPOSABLE) ×2
GRADUATE 1200CC STRL 31836 (MISCELLANEOUS) ×1 IMPLANT
IV CATH ANGIO 12GX3 LT BLUE (NEEDLE) ×1 IMPLANT
KIT TURNOVER KIT A (KITS) ×1 IMPLANT
KIT WILSON FRAME (KITS) ×1 IMPLANT
KNIFE BAYONET SHORT DISCETOMY (MISCELLANEOUS) IMPLANT
MANIFOLD NEPTUNE II (INSTRUMENTS) ×1 IMPLANT
MARKER SKIN DUAL TIP RULER LAB (MISCELLANEOUS) ×2 IMPLANT
NDL SAFETY ECLIP 18X1.5 (MISCELLANEOUS) ×1 IMPLANT
NS IRRIG 1000ML POUR BTL (IV SOLUTION) ×1 IMPLANT
PACK LAMINECTOMY NEURO (CUSTOM PROCEDURE TRAY) ×1 IMPLANT
PAD ARMBOARD 7.5X6 YLW CONV (MISCELLANEOUS) ×1 IMPLANT
STAPLER SKIN PROX 35W (STAPLE) IMPLANT
SURGIFLO W/THROMBIN 8M KIT (HEMOSTASIS) ×1 IMPLANT
SUT NURALON 4 0 TR CR/8 (SUTURE) IMPLANT
SUT POLYSORB 2-0 5X18 GS-10 (SUTURE) ×1 IMPLANT
SUT VIC AB 0 CT1 18XCR BRD 8 (SUTURE) ×1 IMPLANT
SUT VIC AB 0 CT1 8-18 (SUTURE) ×2
SYR 10ML LL (SYRINGE) ×2 IMPLANT
SYR 30ML LL (SYRINGE) ×1 IMPLANT
SYR 3ML LL SCALE MARK (SYRINGE) ×1 IMPLANT
TOWEL OR 17X26 4PK STRL BLUE (TOWEL DISPOSABLE) ×4 IMPLANT
TRAP FLUID SMOKE EVACUATOR (MISCELLANEOUS) ×1 IMPLANT
TUBING CONNECTING 10 (TUBING) ×1 IMPLANT
WATER STERILE IRR 500ML POUR (IV SOLUTION) ×1 IMPLANT

## 2022-10-03 NOTE — Anesthesia Preprocedure Evaluation (Addendum)
Anesthesia Evaluation  Patient identified by MRN, date of birth, ID band Patient awake    Reviewed: Allergy & Precautions, H&P , NPO status , Patient's Chart, lab work & pertinent test results, reviewed documented beta blocker date and time   History of Anesthesia Complications Negative for: history of anesthetic complications  Airway Mallampati: II  TM Distance: >3 FB Neck ROM: full    Dental  (+) Dental Advidsory Given, Caps, Chipped, Teeth Intact   Pulmonary neg shortness of breath, sleep apnea , neg COPD, neg recent URI   Pulmonary exam normal breath sounds clear to auscultation       Cardiovascular Exercise Tolerance: Good hypertension, (-) angina (-) Past MI and (-) Cardiac Stents Normal cardiovascular exam(-) dysrhythmias (-) Valvular Problems/Murmurs Rhythm:regular Rate:Normal     Neuro/Psych  PSYCHIATRIC DISORDERS Anxiety Depression    negative neurological ROS     GI/Hepatic Neg liver ROS,GERD  ,,  Endo/Other  diabetesHypothyroidism    Renal/GU CRFRenal disease  negative genitourinary   Musculoskeletal   Abdominal   Peds  Hematology  (+) Blood dyscrasia, anemia   Anesthesia Other Findings Past Medical History: No date: Anemia No date: Chronic kidney disease No date: Depression No date: Diabetes mellitus without complication (HCC) No date: GERD (gastroesophageal reflux disease) No date: Hypertension No date: Hypothyroidism   Reproductive/Obstetrics negative OB ROS                             Anesthesia Physical Anesthesia Plan  ASA: 3  Anesthesia Plan: General   Post-op Pain Management:    Induction: Intravenous  PONV Risk Score and Plan: 3 and Ondansetron, Dexamethasone and Treatment may vary due to age or medical condition  Airway Management Planned: Oral ETT  Additional Equipment:   Intra-op Plan:   Post-operative Plan: Extubation in OR  Informed Consent:  I have reviewed the patients History and Physical, chart, labs and discussed the procedure including the risks, benefits and alternatives for the proposed anesthesia with the patient or authorized representative who has indicated his/her understanding and acceptance.     Dental Advisory Given  Plan Discussed with: Anesthesiologist, CRNA and Surgeon  Anesthesia Plan Comments:         Anesthesia Quick Evaluation

## 2022-10-03 NOTE — Plan of Care (Signed)
  Problem: Education: Goal: Ability to verbalize activity precautions or restrictions will improve Outcome: Progressing   Problem: Education: Goal: Ability to verbalize activity precautions or restrictions will improve Outcome: Progressing

## 2022-10-03 NOTE — Consult Note (Signed)
Referring Physician:  No referring provider defined for this encounter.  Primary Physician:  [facility]  History of Present Illness: 10/03/2022 Tamara [name] is here today with a chief complaint of right leg pain and numbness. She was seen by Dr. Lacinda Axon in the ER last evening with concern for impending cauda equina syndrome due to worsening numbness and leg pain.  She has a very large disc herniation and difficulty walking.  He has asked that I help with surgery due to the large disc size and cystic nature of the disc herniation.  Review of Systems:  A 10 point review of systems is negative, except for the pertinent positives and negatives detailed in the HPI.  Past Medical History: Past Medical History:  Diagnosis Date   Anemia    Chronic kidney disease    Depression    Diabetes mellitus without complication (HCC)    GERD (gastroesophageal reflux disease)    Hypertension    Hypothyroidism     Past Surgical History: Past Surgical History:  Procedure Laterality Date   ACHILLES TENDON REPAIR     BACK SURGERY     HERNIA REPAIR     SHOULDER SURGERY      Allergies: Allergies as of 10/02/2022 - Review Complete 10/02/2022  Allergen Reaction Noted   Ace inhibitors Cough 06/03/2018    Medications: Current Meds  Medication Sig   acetaminophen (TYLENOL) 325 MG tablet Take 650 mg by mouth every 6 (six) hours as needed.   aspirin 81 MG chewable tablet Chew 81 mg by mouth daily.   atorvastatin (LIPITOR) 20 MG tablet Take 20 mg by mouth at bedtime.   BuPROPion HCl ER, XL, 450 MG TB24 Take 450 mg by mouth daily.   busPIRone (BUSPAR) 10 MG tablet Take 10 mg by mouth 2 (two) times daily.   calcitonin, salmon, (MIACALCIN/FORTICAL) 200 UNIT/ACT nasal spray Place 1 spray into alternate nostrils daily.   esomeprazole (NEXIUM) 20 MG capsule Take 20 mg by mouth daily. To prevent coughing after eating.   insulin glargine (LANTUS) 100 UNIT/ML Solostar Pen Inject 10  Units into the skin daily. (Patient taking differently: Inject 15 Units into the skin daily.)   lamoTRIgine (LAMICTAL) 150 MG tablet Take 150 mg by mouth 2 (two) times daily.   levothyroxine (SYNTHROID) 112 MCG tablet Take 112 mcg by mouth daily.   magnesium oxide (MAG-OX) 400 MG tablet Take 2 tablets by mouth daily.   metFORMIN (GLUCOPHAGE) 500 MG tablet Take 500 mg by mouth 2 (two) times daily.   sennosides-docusate sodium (SENOKOT-S) 8.6-50 MG tablet Take 2 tablets by mouth daily.   valsartan (DIOVAN) 80 MG tablet Take 80 mg by mouth daily.    Social History: Social History   Tobacco Use   Smoking status: Never   Smokeless tobacco: Never  Vaping Use   Vaping Use: Never used  Substance Use Topics   Alcohol use: No   Drug use: No    Family Medical History: Family History  Adopted: Yes  Problem Relation Age of Onset   Cancer Mother    Diabetes Mother     Physical Examination: Vitals:   10/03/22 0403 10/03/22 0747  BP: (!) 122/95 (!) 160/114  Pulse: 87 90  Resp: 16 18  Temp: 97.8 F (36.6 C) (!) 97.5 F (36.4 C)  SpO2: 97% 97%    General: Patient is well developed, well nourished, calm, collected, and in no apparent distress. Attention to examination is appropriate.  Neck:  Supple.  Full range of motion.  Respiratory: Patient is breathing without any difficulty.   NEUROLOGICAL:     Awake, alert, oriented to person, place, and time.  Speech is clear and fluent. Fund of knowledge is appropriate.   Cranial Nerves: appear intact  ROM of spine: untested.    Strength: MAE  Gait is untested.     Medical Decision Making  Imaging: MRI L spine 10/02/22 IMPRESSION: 1. Advanced degeneration with retrolisthesis at T12-L1 to L2-3. 2. Partially cystic structure posterior to the L2 body on the right, likely cystic degeneration of a disc extrusion. 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 3. Foraminal impingement at T12-L1 to L2-3. 4. Solid fusion at L3-S1.     Electronically Signed   By: Tiburcio Pea M.D.   On: 10/02/2022 12:09  I have personally reviewed the images and agree with the above interpretation.  Assessment and Plan: [name] is a pleasant 76 y.o. female with impending cauda equina syndrome.  She has worsening symptoms with concern due to the worsening appearance of this disc herniation on imaging.  She has a very severe compression due this disc herniation.  Risks and benefits reviewed by Dr. Adriana Simas.     Nikodem Leadbetter K. Myer Haff MD, [facility]

## 2022-10-03 NOTE — Interval H&P Note (Signed)
History and Physical Interval Note:  10/03/2022 10:14 AM  Tamara [name]  has presented today for surgery, with the diagnosis of Lumbar radiculopathy.  The various methods of treatment have been discussed with the patient and family. After consideration of risks, benefits and other options for treatment, the patient has consented to  Procedure(s) with comments: LUMBAR LAMINECTOMY/DECOMPRESSION MICRODISCECTOMY 1 LEVEL (Right) - Open - McCullouch retractors as a surgical intervention.  The patient's history has been reviewed, patient examined, no change in status, stable for surgery.  I have reviewed the patient's chart and labs.  Questions were answered to the patient's satisfaction.     Deetta Perla

## 2022-10-03 NOTE — Transfer of Care (Signed)
Immediate Anesthesia Transfer of Care Note  Patient: Tamara [name]  Procedure(s) Performed: LUMBAR LAMINECTOMY/DECOMPRESSION MICRODISCECTOMY 1 LEVEL (Right)  Patient Location: PACU  Anesthesia Type:General  Level of Consciousness: drowsy  Airway & Oxygen Therapy: Patient Spontanous Breathing and Patient connected to nasal cannula oxygen  Post-op Assessment: Report given to RN, Post -op Vital signs reviewed and stable, and Patient moving all extremities X 4  Post vital signs: Reviewed and stable  Last Vitals:  Vitals Value Taken Time  BP 136/81 10/03/22 1303  Temp 36.2 C 10/03/22 1303  Pulse 75 10/03/22 1303  Resp 14 10/03/22 1303  SpO2 100 % 10/03/22 1303    Last Pain:  Vitals:   10/03/22 1303  TempSrc:   PainSc: Asleep      Patients Stated Pain Goal: 3 (16/60/63 0160)  Complications: No notable events documented.

## 2022-10-03 NOTE — Op Note (Signed)
Indications: Tamara [name] is a 76 yo female who presented with new onset numbness and a large disc herniation concerning for impending cauda equina syndrome due to cauda equina compression.  Due to worsening symptoms, surgery was recommended. Findings: large disc herniation behind L2 vertebral body  Preoperative Diagnosis: Lumbar Radiculopathy Postoperative Diagnosis: same   EBL: 20 ml IVF: see AR ml Drains: none Disposition: Extubated and Stable to PACU Complications: none  No foley catheter was placed.   Preoperative Note:   Risks of surgery discussed include: infection, bleeding, stroke, coma, death, paralysis, CSF leak, nerve/spinal cord injury, numbness, tingling, weakness, complex regional pain syndrome, recurrent stenosis and/or disc herniation, vascular injury, development of instability, neck/back pain, need for further surgery, persistent symptoms, development of deformity, and the risks of anesthesia. They understood these risks and have agreed to proceed.  The risks were discussed with the patient by Dr. Adriana Simas.  Dr. Adriana Simas acted as Designer, television/film set for this procedure.  Operative Note:    The patient was then brought from the preoperative center with intravenous access established.  The patient underwent general anesthesia and endotracheal tube intubation, then was rotated on the [facility] table where all pressure points were appropriately padded.  An incision was marked with flouroscopy. The skin was then thoroughly cleansed.  Perioperative antibiotic prophylaxis was administered.  Sterile prep and drapes were then applied and a timeout was then observed.    Once this was complete a 3 cm incision was opened with the use of a #10 blade knife.  The paraspinus muscled on the right were subperiosteally dissected until the facet was visualized. Flouroscopy was used to confirm the level. A self-retaining retractor was placed.  The microscope was then sterilely brought into the field.  Using a high speed drill, the right L2/3 laminectomy and medial facet were drilled until ligamentum flavum was visualized. The drill was extended superiorly to perform complete R L2 hemilaminectomy.  After the laminoforaminotomy was performed, a curette was used to dissect the ligamentum flavum until the epidural fat and dura were visualized. The ligamentum was then carefully removed with 15mm Kerrison punch and rongeurs.   Once the underlying dura was visualized a Penfield 4 was then used to dissect and expose the traversing nerve root.  Once this was identified a nerve root retractor suction was used to mobilize this medially.  There was a very large disc herniation identified.  The venous plexus was hemostased with Surgifoam and light bipolar use.  The extruded disc fragment was then dissected free with microsurgical instruments and removed with a micropituitary rongeur in piecemeal fashion until no further removal could safely be performed.  Once the thecal sac and nerve root were noted to be relaxed and under less tension the ball-tipped feeler and [facility] were passed along the foramen distally to to ensure no residual compression was noted.  With none noted attention was then turned to closure.    Hemostasis was confirmed after removal of the gelfoam. The retractor was then removed and hemostasis achieved in the superficial tissues. The wound was copiously irrigated.  The fascial layer was reapproximated with the use of a 0- Vicryl suture.  Subcutaneous tissue layer was reapproximated using 2-0 Vicryl suture.  The skin was then cleansed and Dermabond was used to close the skin opening.  Patient was then rotated back to the preoperative bed awakened from anesthesia and taken to recovery all counts are correct in this case.  I performed the entire procedure with the assistance of  Cathleen Fears MD as an Pensions consultant. An assistant was required for this procedure due to the complexity.  The  assistant provided assistance in tissue manipulation and suction, and was required for the successful and safe performance of the procedure. I performed the critical portions of the procedure.    Meade Maw MD

## 2022-10-03 NOTE — Progress Notes (Signed)
Pt transferred to surgery

## 2022-10-04 MED ORDER — TIZANIDINE HCL 2 MG PO TABS: 1.0000 mg | ORAL_TABLET | Freq: Three times a day (TID) | ORAL | Status: DC | PRN

## 2022-10-04 MED ORDER — DIPHENHYDRAMINE HCL 50 MG/ML IJ SOLN
25.0000 mg | INTRAMUSCULAR | Status: DC | PRN
  Administered 2022-10-04 – 2022-10-05 (×3): 25 mg via INTRAVENOUS
  Filled 2022-10-04 (×3): qty 1

## 2022-10-04 MED ORDER — KETOROLAC TROMETHAMINE 15 MG/ML IJ SOLN
7.5000 mg | Freq: Four times a day (QID) | INTRAMUSCULAR | Status: DC
  Administered 2022-10-04 – 2022-10-05 (×4): 7.5 mg via INTRAVENOUS
  Filled 2022-10-04 (×4): qty 1

## 2022-10-04 MED ORDER — HYDROCODONE-ACETAMINOPHEN 5-325 MG PO TABS
1.0000 | ORAL_TABLET | ORAL | Status: DC | PRN
  Administered 2022-10-04 – 2022-10-05 (×3): 2 via ORAL
  Filled 2022-10-04 (×3): qty 2

## 2022-10-04 MED ORDER — BLISTEX MEDICATED EX OINT
TOPICAL_OINTMENT | CUTANEOUS | Status: DC | PRN
  Filled 2022-10-04: qty 6.3

## 2022-10-04 NOTE — Progress Notes (Signed)
    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  POD1: Doing well.  Had some pain overnight.  Physical Exam: Vitals:   10/04/22 0735 10/04/22 0959  BP: (!) 87/59 105/64  Pulse: 85 97  Resp: 18 16  Temp: 98.2 F (36.8 C) 98 F (36.7 C)  SpO2: 98% 98%    AA Ox3 CNI  Strength:5/5 throughout Ble Incision c/d/i  Data:  Other tests/results: n/a  Assessment/Plan:  Tamara [name] is doing well after surgery.  - mobilize - pain control - DVT prophylaxis - PTOT   Meade Maw MD, [facility] Department of Neurosurgery

## 2022-10-04 NOTE — Plan of Care (Signed)
  Problem: Education: Goal: Knowledge of General Education information will improve Description: Including pain rating scale, medication(s)/side effects and non-pharmacologic comfort measures Outcome: Progressing   Problem: Health Behavior/Discharge Planning: Goal: Ability to manage health-related needs will improve Outcome: Progressing   Problem: Clinical Measurements: Goal: Ability to maintain clinical measurements within normal limits will improve Outcome: Progressing Goal: Will remain free from infection Outcome: Progressing Goal: Diagnostic test results will improve Outcome: Progressing Goal: Respiratory complications will improve Outcome: Progressing Goal: Cardiovascular complication will be avoided Outcome: Progressing   Problem: Activity: Goal: Risk for activity intolerance will decrease Outcome: Progressing   Problem: Nutrition: Goal: Adequate nutrition will be maintained Outcome: Progressing   Problem: Coping: Goal: Level of anxiety will decrease Outcome: Progressing   Problem: Elimination: Goal: Will not experience complications related to bowel motility Outcome: Progressing Goal: Will not experience complications related to urinary retention Outcome: Progressing   Problem: Pain Managment: Goal: General experience of comfort will improve Outcome: Progressing   Problem: Safety: Goal: Ability to remain free from injury will improve Outcome: Progressing   Problem: Skin Integrity: Goal: Risk for impaired skin integrity will decrease Outcome: Progressing   Problem: Education: Goal: Ability to verbalize activity precautions or restrictions will improve Outcome: Progressing Goal: Knowledge of the prescribed therapeutic regimen will improve Outcome: Progressing Goal: Understanding of discharge needs will improve Outcome: Progressing   Problem: Activity: Goal: Ability to avoid complications of mobility impairment will improve Outcome: Progressing Goal:  Ability to tolerate increased activity will improve Outcome: Progressing Goal: Will remain free from falls Outcome: Progressing   Problem: Bowel/Gastric: Goal: Gastrointestinal status for postoperative course will improve Outcome: Progressing   Problem: Clinical Measurements: Goal: Ability to maintain clinical measurements within normal limits will improve Outcome: Progressing Goal: Postoperative complications will be avoided or minimized Outcome: Progressing Goal: Diagnostic test results will improve Outcome: Progressing   Problem: Pain Management: Goal: Pain level will decrease Outcome: Progressing   Problem: Skin Integrity: Goal: Will show signs of wound healing Outcome: Progressing   Problem: Health Behavior/Discharge Planning: Goal: Identification of resources available to assist in meeting health care needs will improve Outcome: Progressing   Problem: Bladder/Genitourinary: Goal: Urinary functional status for postoperative course will improve Outcome: Progressing   Problem: Activity: Goal: Ability to avoid complications of mobility impairment will improve Outcome: Progressing Goal: Ability to tolerate increased activity will improve Outcome: Progressing Goal: Will remain free from falls Outcome: Progressing   Problem: Bowel/Gastric: Goal: Gastrointestinal status for postoperative course will improve Outcome: Progressing   

## 2022-10-04 NOTE — Evaluation (Addendum)
Physical Therapy Evaluation Patient Details Name: Tamara [name] MRN: [id number] DOB: [birth year] Today's Date: 10/04/2022  History of Present Illness  Tamara [name] arrived to hospital ED on 10/02/2022 via [facility] from [facility] (ALF and memory care facility)  with a chief complaint of right leg pain and numbness. She was seen by Dr. Adriana Simas in the ER with concern for impending cauda equina syndrome due to worsening numbness and leg pain.  She has a very large disc herniation and difficulty walking. She underwent LUMBAR LAMINECTOMY/DECOMPRESSION MICRODISCECTOMY 1 LEVEL (Right levels L2/3) on 10/03/2022. No brace recommended by surgeon post op.   Clinical Impression  Patient in right sidelying upon arrival. She appears alert and oriented x 3 and agreeable to PT eval. States she has been in pain all night and has requested more pain meds. Dr. Myer Haff and nursing came in during her session to assess her and take vitals (BP low but asymptomatic). Patient reports she has been staying at [facility] (she calls it a "nursing home" but agrees it is ALF with prompting) for about a month since she was sick in August for "unknown reason." She is unclear on how much assistance she needed prior to hospitalization but states she has been using a rollator independently for ambulation for about a month and someone is there with her sometimes while she showers and dresses. She states she needs help with putting on her socks. She reports 3 falls maybe in the last 3 years but is unsure. She agrees she can get 24/7 assistance at the ALF if needed. She states she gets PT once a week at her ALF. She states her son is overseas in the [place] as he works for the [employer], but he returned over the summer to help her when she moved the ALF. Her lack of clarity when asked about her home and functional history is concerning for cognition. Upon PT eval she needed min A for bed mobility, CGA for sit <> stand to RW and and  CGA for ambulation approx 220 feet. She needed cuing for directions to find her room and to use hands when sitting in the chair. She had trouble using her phone to order lunch and dinner and PT could not get phone to work either, so called dining for patient to let them know she did not care what meal she got (as requested by patient). Patient demo short steps and stooped posture during ambulation and had limited success improving posture due to increased B hip pain (R > L) with those attempts when cued. Patient's BP was low in right sidelying (89/39 mmHg), siting (105/64 mmHg), and initial standing (107/68 mmHg), but was more normal with walking (120/79 mmHg). Last BP reading was 105/61 mmHg with HR 84 bpm in sitting at end of session. Patient was asymptomatic for low BP throughout session. Patient appears to have experienced a decrease in functional independence and is at risk for falls. PT recommends she return to her ALF with supervision when out of bed or out of her chair and receive HHPT. Patient would benefit from skilled physical therapy to address impairments and functional limitations (see PT Problem List below) to work towards stated goals and return to PLOF or maximal functional independence.      Recommendations for follow up therapy are one component of a multi-disciplinary discharge planning process, led by the attending physician.  Recommendations may be updated based on patient status, additional functional criteria and insurance authorization.  Follow Up Recommendations  Home health PT      Assistance Recommended at Discharge Frequent or constant Supervision/Assistance (supervision/assistance for all mobility)  Patient can return home with the following  A little help with walking and/or transfers;A lot of help with bathing/dressing/bathroom;A lot of help with walking and/or transfers;A little help with bathing/dressing/bathroom;Help with stairs or ramp for entrance;Assist for  transportation;Assistance with cooking/housework    Equipment Recommendations None recommended by PT  Recommendations for Other Services       Functional Status Assessment Patient has had a recent decline in their functional status and demonstrates the ability to make significant improvements in function in a reasonable and predictable amount of time.     Precautions / Restrictions Precautions Precautions: Fall Restrictions Weight Bearing Restrictions: No      Mobility  Bed Mobility Overal bed mobility: Needs Assistance Bed Mobility: Supine to Sit     Supine to sit: Min assist     General bed mobility comments: hand held assist left hand and grapsed bed bar    Transfers Overall transfer level: Needs assistance Equipment used: Rolling walker (2 wheels) Transfers: Sit to/from Stand Sit to Stand: Supervision           General transfer comment: sit <> stand bed to chair to RW with SBA    Ambulation/Gait Ambulation/Gait assistance: Min guard Gait Distance (Feet): 220 Feet Assistive device: Rolling walker (2 wheels) Gait Pattern/deviations: Step-through pattern Gait velocity: slow     General Gait Details: Patient ambulated with stooped posture approx 220 feet with RW and CGA. Attempts to stand up strigher resulted in increased pain in B hip (R > L) and were temporary with cuing. Patient did well keeping RW close to body with minimal cuing.  Stairs            Wheelchair Mobility    Modified Rankin (Stroke Patients Only)       Balance Overall balance assessment: Needs assistance Sitting-balance support: No upper extremity supported Sitting balance-Leahy Scale: Good Sitting balance - Comments: steady sitting at edge of bed   Standing balance support: Reliant on assistive device for balance, During functional activity, Bilateral upper extremity supported Standing balance-Leahy Scale: Fair Standing balance comment: dependent on RW for balance in  standing.                             Pertinent Vitals/Pain Pain Assessment Pain Assessment: 0-10 Pain Score: 8  Pain Location: incision site and rigth hip when straightening up. Pain Descriptors / Indicators: Grimacing, Discomfort Pain Intervention(s): Limited activity within patient's tolerance, Monitored during session, Repositioned, Patient requesting pain meds-RN notified    Home Living Family/patient expects to be discharged to:: Assisted living [facility], pt states she has been there for about a month after she got sick in August.)                 Home Equipment: Grab bars - tub/shower;Rollator (4 wheels);Shower seat;Wheelchair - manual Additional Comments: patient repots she has trouble remembering anything but is alert and oriented x3.    Prior Function Prior Level of Function : Needs assist;Patient poor historian/Family not available;History of Falls (last six months) (patient reports she has fallen in last 6 months but unsure of how may falls. She thinks three in the last 3 years.)       Physical Assist : ADLs (physical)   ADLs (physical): Bathing;Dressing Mobility Comments: Patient reports she ambulates with  Rollator independently. ADLs Comments:  Patient reports she sometimes has assistance with showering, assistance with dressing LEs, I with grooming and brushing teeth.     Hand Dominance   Dominant Hand: Right    Extremity/Trunk Assessment   Upper Extremity Assessment Upper Extremity Assessment: Overall WFL for tasks assessed    Lower Extremity Assessment Lower Extremity Assessment: Generalized weakness    Cervical / Trunk Assessment Cervical / Trunk Assessment: Kyphotic (very stooped with increased thoracic kyphosis and forward head in standing.)  Communication   Communication: No difficulties  Cognition Arousal/Alertness: Awake/alert Behavior During Therapy: WFL for tasks assessed/performed Overall Cognitive Status: No  family/caregiver present to determine baseline cognitive functioning (Patient is unclear and seems forgetful when asking history but she seems A&Ox3. She came from assisted living and it is unclear when talking to her how much help she gets there.)                                          General Comments      Exercises Other Exercises Other Exercises: educated patient on role of PT in acute care setting, discharge reccomendations, benefits of mobiltiy. Patient assisted with getting working chair alarm, working phone, lunch and dinner order, correct dining phone number on while board.   Assessment/Plan    PT Assessment Patient needs continued PT services  PT Problem List Decreased strength;Decreased range of motion;Decreased activity tolerance;Decreased balance;Decreased mobility;Decreased knowledge of precautions;Decreased knowledge of use of DME;Decreased skin integrity       PT Treatment Interventions DME instruction;Gait training;Functional mobility training;Therapeutic activities;Therapeutic exercise;Balance training;Neuromuscular re-education;Patient/family education    PT Goals (Current goals can be found in the Care Plan section)  Acute Rehab PT Goals Patient Stated Goal: "I dont' know right now" PT Goal Formulation: With patient    Frequency BID     Co-evaluation               AM-PAC PT "6 Clicks" Mobility  Outcome Measure Help needed turning from your back to your side while in a flat bed without using bedrails?: A Little Help needed moving from lying on your back to sitting on the side of a flat bed without using bedrails?: A Little Help needed moving to and from a bed to a chair (including a wheelchair)?: A Little Help needed standing up from a chair using your arms (e.g., wheelchair or bedside chair)?: A Little Help needed to walk in hospital room?: A Little Help needed climbing 3-5 steps with a railing? : A Little 6 Click Score: 18    End  of Session Equipment Utilized During Treatment: Gait belt Activity Tolerance: Patient tolerated treatment well Patient left: in chair;with call bell/phone within reach;with chair alarm set Nurse Communication: Mobility status PT Visit Diagnosis: Unsteadiness on feet (R26.81);Pain;Difficulty in walking, not elsewhere classified (R26.2) Pain - part of body:  (low back and bilateral hips)    Time: 2025-4270 PT Time Calculation (min) (ACUTE ONLY): 37 min   Charges:   PT Evaluation $PT Eval Moderate Complexity: 1 Mod PT Treatments $Therapeutic Activity: 8-22 mins        Everlean Alstrom. Graylon Good, PT, DPT 10/04/22, 11:14 AM  [facility] [address] [place], [place] [address] P: [phone] I F: [phone]

## 2022-10-05 ENCOUNTER — Encounter: Payer: Self-pay | Admitting: Neurosurgery

## 2022-10-05 MED ORDER — BACLOFEN 10 MG PO TABS
10.0000 mg | ORAL_TABLET | Freq: Three times a day (TID) | ORAL | Status: DC | PRN
  Administered 2022-10-05: 10 mg via ORAL
  Filled 2022-10-05: qty 1

## 2022-10-05 MED ORDER — OXYCODONE-ACETAMINOPHEN 5-325 MG PO TABS
1.0000 | ORAL_TABLET | ORAL | Status: DC | PRN
  Administered 2022-10-05: 2 via ORAL
  Administered 2022-10-05 – 2022-10-06 (×3): 1 via ORAL
  Filled 2022-10-05: qty 1
  Filled 2022-10-05: qty 2
  Filled 2022-10-05 (×2): qty 1

## 2022-10-05 MED ORDER — CELECOXIB 200 MG PO CAPS
200.0000 mg | ORAL_CAPSULE | Freq: Two times a day (BID) | ORAL | Status: DC
  Administered 2022-10-05 – 2022-10-06 (×3): 200 mg via ORAL
  Filled 2022-10-05 (×3): qty 1

## 2022-10-05 NOTE — Progress Notes (Addendum)
Physical Therapy Treatment Patient Details Name: Tamara [name] MRN: [id number] DOB: 23-Aug-1946 Today's Date: 10/05/2022   History of Present Illness 76 yo female s/p lumbar laminectomy/decompression 1 level L2/3 on right. PMH includes HTN, DM, hypothyroidism, renal disease    PT Comments    Pt found in chair upon PT entry. Pt c/o 8/10 low back pain s/p laminectomy/decompression. Sit<>stand with CGA, RW, and min VC for hand placement. Medication fell from pt's gown and RN was notified. Pt ambulated 220 ft with RW and CGA. No verbal cues required for RW safety.  Pt maintained static balance without UE support for clothing management. Pt pleasant throughout session, but concerned she is "not on correct pain medication." Pt would benefit from skilled physical therapy to address listed deficits (see below) to increase independence with ADLs and quality of life. Current recommendation is for pt to return to ALF with HHPT.    Recommendations for follow up therapy are one component of a multi-disciplinary discharge planning process, led by the attending physician.  Recommendations may be updated based on patient status, additional functional criteria and insurance authorization.  Follow Up Recommendations  Home health PT     Assistance Recommended at Discharge Frequent or constant Supervision/Assistance  Patient can return home with the following A little help with walking and/or transfers;A little help with bathing/dressing/bathroom;Help with stairs or ramp for entrance;Assist for transportation;Assistance with cooking/housework;Direct supervision/assist for medications management   Equipment Recommendations  None recommended by PT    Recommendations for Other Services       Precautions / Restrictions Precautions Precautions: Fall Restrictions Weight Bearing Restrictions: No     Mobility  Bed Mobility                    Transfers Overall transfer level: Needs  assistance Equipment used: Rolling walker (2 wheels) Transfers: Sit to/from Stand Sit to Stand: Min guard           General transfer comment: min VC required for hand placement on RW    Ambulation/Gait Ambulation/Gait assistance: Min guard Gait Distance (Feet): 220 Feet Assistive device: Rolling walker (2 wheels) Gait Pattern/deviations: Step-through pattern, Decreased stance time - right, Decreased stance time - left, Decreased step length - left, Decreased step length - right       General Gait Details: pt maintained kyphotic posture throughout gait w RW and CGA. No cueing required on RW safety   Stairs             Wheelchair Mobility    Modified Rankin (Stroke Patients Only)       Balance Overall balance assessment: Needs assistance Sitting-balance support: Feet supported Sitting balance-Leahy Scale: Good     Standing balance support: Bilateral upper extremity supported Standing balance-Leahy Scale: Fair Standing balance comment: able to let go of RW for static standing with clothing management                            Cognition Arousal/Alertness: Awake/alert Behavior During Therapy: WFL for tasks assessed/performed Overall Cognitive Status: No family/caregiver present to determine baseline cognitive functioning                                          Exercises      General Comments        Pertinent Vitals/Pain Pain Assessment Pain Score: 8  Pain Location: low back Pain Descriptors / Indicators: Aching, Grimacing Pain Intervention(s): Monitored during session, Premedicated before session    Home Living Family/patient expects to be discharged to:: Assisted living [facility], pt states she has been there for about a month after she got sick in August.)                 Home Equipment: Grab bars - tub/shower;Rollator (4 wheels);Shower seat;Wheelchair - manual Additional Comments: patient repots she has  trouble remembering anything but is alert and oriented x3.    Prior Function            PT Goals (current goals can now be found in the care plan section) Acute Rehab PT Goals Patient Stated Goal: to reduce pain and increase strength PT Goal Formulation: With patient Progress towards PT goals: Progressing toward goals    Frequency    BID      PT Plan Current plan remains appropriate    Co-evaluation              AM-PAC PT "6 Clicks" Mobility   Outcome Measure  Help needed turning from your back to your side while in a flat bed without using bedrails?: A Little Help needed moving from lying on your back to sitting on the side of a flat bed without using bedrails?: A Little Help needed moving to and from a bed to a chair (including a wheelchair)?: A Little Help needed standing up from a chair using your arms (e.g., wheelchair or bedside chair)?: A Little Help needed to walk in hospital room?: A Little Help needed climbing 3-5 steps with a railing? : A Little 6 Click Score: 18    End of Session Equipment Utilized During Treatment: Gait belt Activity Tolerance: Patient tolerated treatment well Patient left: in chair;with call bell/phone within reach;with chair alarm set Nurse Communication: Mobility status PT Visit Diagnosis: Unsteadiness on feet (R26.81);Pain;Difficulty in walking, not elsewhere classified (R26.2)     Time: 4081-4481 PT Time Calculation (min) (ACUTE ONLY): 20 min  Charges:  $Therapeutic Activity: 8-22 mins                        [employer], SPT 10/05/2022, 11:11 AM

## 2022-10-05 NOTE — Progress Notes (Addendum)
Physical Therapy Treatment Patient Details Name: Tamara [name] MRN: [id number] DOB: 1946-10-11 Today's Date: 10/05/2022   History of Present Illness 76 yo female s/p lumbar laminectomy/decompression 1 level L2/3 on right. PMH includes HTN, DM, hypothyroidism, renal disease    PT Comments    Pt found in chair upon PT entry with c/o 7/10 pain in low back s/p lumbar laminectomy/decompression. Sit<>Stand CGA and RW. Pt ambulated 241ft with RW and CGA. Pt concerned she is not on the proper pain medication, but was pleasant throughout PT session. Treatment frequency updated due to pt progress. Pt would benefit from skilled physical therapy to address listed deficits (see below) to increase functionality and independence with ADLs. Current recommendation is for pt to return to ALF with HHPT to return to PLOF.     Recommendations for follow up therapy are one component of a multi-disciplinary discharge planning process, led by the attending physician.  Recommendations may be updated based on patient status, additional functional criteria and insurance authorization.  Follow Up Recommendations  Home health PT     Assistance Recommended at Discharge Frequent or constant Supervision/Assistance  Patient can return home with the following A little help with walking and/or transfers;A little help with bathing/dressing/bathroom;Help with stairs or ramp for entrance;Assist for transportation;Assistance with cooking/housework;Direct supervision/assist for medications management   Equipment Recommendations  None recommended by PT    Recommendations for Other Services       Precautions / Restrictions Precautions Precautions: Fall Restrictions Weight Bearing Restrictions: No     Mobility  Bed Mobility                    Transfers Overall transfer level: Needs assistance Equipment used: Rolling walker (2 wheels) Transfers: Sit to/from Stand Sit to Stand: Min guard           General  transfer comment: min VC required for hand placement on RW    Ambulation/Gait Ambulation/Gait assistance: Min guard Gait Distance (Feet): 220 Feet Assistive device: Rolling walker (2 wheels) Gait Pattern/deviations: Step-through pattern, Decreased stance time - right, Decreased stance time - left, Decreased step length - left, Decreased step length - right       General Gait Details: pt maintained kyphotic posture throughout gait w RW and CGA. No cueing required on RW safety   Stairs             Wheelchair Mobility    Modified Rankin (Stroke Patients Only)       Balance Overall balance assessment: Needs assistance Sitting-balance support: Feet supported Sitting balance-Leahy Scale: Good     Standing balance support: Bilateral upper extremity supported Standing balance-Leahy Scale: Fair Standing balance comment: able to let go of RW for static standing with clothing management                            Cognition Arousal/Alertness: Awake/alert Behavior During Therapy: WFL for tasks assessed/performed Overall Cognitive Status: Within Functional Limits for tasks assessed                                          Exercises      General Comments        Pertinent Vitals/Pain Pain Assessment Pain Score: 7  Pain Location: low back Pain Descriptors / Indicators: Aching, Grimacing Pain Intervention(s): Monitored during session, Premedicated before session  Home Living                          Prior Function            PT Goals (current goals can now be found in the care plan section) Acute Rehab PT Goals Patient Stated Goal: to reduce pain and increase strength PT Goal Formulation: With patient Progress towards PT goals: Progressing toward goals    Frequency    7X/week      PT Plan Frequency needs to be updated    Co-evaluation              AM-PAC PT "6 Clicks" Mobility   Outcome Measure  Help  needed turning from your back to your side while in a flat bed without using bedrails?: A Little Help needed moving from lying on your back to sitting on the side of a flat bed without using bedrails?: A Little Help needed moving to and from a bed to a chair (including a wheelchair)?: A Little Help needed standing up from a chair using your arms (e.g., wheelchair or bedside chair)?: A Little Help needed to walk in hospital room?: A Little Help needed climbing 3-5 steps with a railing? : A Little 6 Click Score: 18    End of Session Equipment Utilized During Treatment: Gait belt Activity Tolerance: Patient tolerated treatment well Patient left: in chair;with call bell/phone within reach;with chair alarm set Nurse Communication: Mobility status PT Visit Diagnosis: Unsteadiness on feet (R26.81);Pain;Difficulty in walking, not elsewhere classified (R26.2)     Time: 1330-1340 PT Time Calculation (min) (ACUTE ONLY): 10 min  Charges:  $Therapeutic Activity: 8-22 mins                       [employer], SPT 10/05/2022, 2:10 PM

## 2022-10-05 NOTE — Care Management Important Message (Signed)
Important Message  Patient Details  Name: Tamara [name] MRN: [id number] Date of Birth: 11-Apr-1946   Medicare Important Message Given:  N/A - LOS <3 / Initial given by admissions     [name] Tawni Melkonian 10/05/2022, 11:15 AM

## 2022-10-05 NOTE — Evaluation (Signed)
Occupational Therapy Evaluation Patient Details Name: Tamara [name] MRN: [id number] DOB: October 29, 1946 Today's Date: 10/05/2022   History of Present Illness Tamara [name] arrived to hospital ED on 10/02/2022 via [facility] from [facility] (ALF and memory care facility)  with a chief complaint of right leg pain and numbness. She was seen by Dr. Lacinda Axon in the ER with concern for impending cauda equina syndrome due to worsening numbness and leg pain.  She has a very large disc herniation and difficulty walking. She underwent LUMBAR LAMINECTOMY/DECOMPRESSION MICRODISCECTOMY 1 LEVEL (Right levels L2/3) on 10/03/2022. No brace recommended by surgeon post op.   Clinical Impression   Pt seen for OT evaluation this date, POD#1 from lumbar surgery noted above. Prior to hospital admission, pt reports using a rollator for mobility and receiving assist "sometimes" for bathing and dressing from staff at her ALF that she reports moving to about a month ago. She reports "lots" of falls but unclear on timing and nature of falls. Pt up in the recliner upon OT's arrival, endorsing significant low back pain that was slightly improved with additional pillow placed at low back in recliner, also premedicated by RN prior to session. Pt notes pain was worst while in the bed but improves sitting up and with mobility. Pt also endorses both feet feeling numb but can feel when therapist touches them. Denies hx of neuropathy and reports this is new as of surgery. MD/RN notified. Pt required SBA for ADL transfers, SBA-CGA for ADL mobility in the room using RW with 1 VC for hand placement to improve upright posture. Pt ambulated to the bathroom and transferred to [facility] over toilet with SBA. Pt demo'd ability to complete pericare and clothing mgt in standing with SBA and no LOB. Pt heavily reliant on RW during mobility. Pt also requires MIN-MOD A for LB ADL tasks to ensure optimal spinal positioning and maximize comfort. Pt educated in role of OT,  recommendations, AE/DME, home/routines modifications, lumbar body mechanics, positioning for sleep, and falls prevention. Handout provided. Pt verbalized understanding of all education/training provided. Pt will benefit from additional skilled OT services to maximize safety and return to PLOF. Recommend HHOT and initial 24/7 supv/assist for all mobility, bathing, LB dressing, and toileting at discharge.      Recommendations for follow up therapy are one component of a multi-disciplinary discharge planning process, led by the attending physician.  Recommendations may be updated based on patient status, additional functional criteria and insurance authorization.   Follow Up Recommendations  Home health OT    Assistance Recommended at Discharge Frequent or constant Supervision/Assistance (sup for all ADL transfers/mobility, bathing, and LB dressing)  Patient can return home with the following A little help with walking and/or transfers;A little help with bathing/dressing/bathroom;Assistance with cooking/housework;Help with stairs or ramp for entrance;Assist for transportation;Direct supervision/assist for medications management    Functional Status Assessment  Patient has had a recent decline in their functional status and demonstrates the ability to make significant improvements in function in a reasonable and predictable amount of time.  Equipment Recommendations  BSC/3in1    Recommendations for Other Services       Precautions / Restrictions Precautions Precautions: Fall Restrictions Weight Bearing Restrictions: No      Mobility Bed Mobility               General bed mobility comments: NT, up in recliner at start and end of session    Transfers Overall transfer level: Needs assistance Equipment used: Rolling walker (2 wheels) Transfers:  Sit to/from Stand Sit to Stand: Supervision           General transfer comment: SBA with VC for anterior weight shift prior to lift  off      Balance Overall balance assessment: Needs assistance Sitting-balance support: No upper extremity supported Sitting balance-Leahy Scale: Good     Standing balance support: No upper extremity supported, During functional activity, Bilateral upper extremity supported, Reliant on assistive device for balance Standing balance-Leahy Scale: Fair Standing balance comment: able to let go of RW for clothing mgt after toileting without LOB but does rely heavily on RW for dynamic balance and ADL mobility                           ADL either performed or assessed with clinical judgement   ADL Overall ADL's : Needs assistance/impaired                                       General ADL Comments: Pt currently requires MIN-MOD A for LB ADL tasks, SBA for ADL transfers with RW, and supv for standing grooming tasks     Vision         Perception     Praxis      Pertinent Vitals/Pain Pain Assessment Pain Assessment: 0-10 Pain Score: 7  Pain Location: 7/10 low back at rest seated and while ambulating, was 10/10 in bed per pt report Pain Descriptors / Indicators: Aching, Grimacing Pain Intervention(s): Limited activity within patient's tolerance, Monitored during session, Premedicated before session, Repositioned     Hand Dominance Right   Extremity/Trunk Assessment Upper Extremity Assessment Upper Extremity Assessment: Overall WFL for tasks assessed   Lower Extremity Assessment Lower Extremity Assessment: Generalized weakness (Pt endorses both feet feeling numb but can feel when therapist touches them. Denies hx of neuropathy)   Cervical / Trunk Assessment Cervical / Trunk Assessment: Kyphotic   Communication Communication Communication: No difficulties   Cognition Arousal/Alertness: Awake/alert Behavior During Therapy: WFL for tasks assessed/performed Overall Cognitive Status: No family/caregiver present to determine baseline cognitive  functioning                                 General Comments: Pt endorses poor STM, perseverates on not being medicated properly for pain, follows commands well     General Comments       Exercises Other Exercises Other Exercises: Pt educated in role of OT, recommendations, AE/DME, home/routines modifications, lumbar body mechanics, positioning for sleep, and falls prevention. Handout provided.   Shoulder Instructions      Home Living Family/patient expects to be discharged to:: Assisted living [facility], pt states she has been there for about a month after she got sick in August.)                             Home Equipment: Grab bars - tub/shower;Rollator (4 wheels);Shower seat;Wheelchair - manual   Additional Comments: patient repots she has trouble remembering anything but is alert and oriented x3.      Prior Functioning/Environment Prior Level of Function : Needs assist;Patient poor historian/Family not available;History of Falls (last six months) (patient reports she has fallen in last 6 months but unsure of how may falls. She thinks three in the  last 3 years.)       Physical Assist : ADLs (physical)   ADLs (physical): Bathing;Dressing Mobility Comments: Patient reports she ambulates with  Rollator independently. ADLs Comments: Patient reports she sometimes has assistance with showering, assistance with dressing LEs, I with grooming and brushing teeth.        OT Problem List: Decreased strength;Pain;Impaired balance (sitting and/or standing);Decreased knowledge of use of DME or AE;Decreased cognition;Decreased knowledge of precautions      OT Treatment/Interventions: Self-care/ADL training;Therapeutic exercise;Therapeutic activities;Cognitive remediation/compensation;DME and/or AE instruction;Patient/family education;Balance training    OT Goals(Current goals can be found in the care plan section) Acute Rehab OT Goals Patient Stated Goal:  have less pain OT Goal Formulation: With patient Time For Goal Achievement: 10/19/22 Potential to Achieve Goals: Good ADL Goals Pt Will Perform Lower Body Dressing: with adaptive equipment;with modified independence;sit to/from stand Pt Will Transfer to Toilet: with modified independence;ambulating (LRAD, elevated commode) Additional ADL Goal #1: Pt will complete seated shower with PRN assist from staff for LB bathing while maintaining spinal guidelines. Additional ADL Goal #2: Pt will complete grooming tasks standing at a sink using learned compensatory strategies to minimize bending, 2/2 opportunities.  OT Frequency: Min 2X/week    Co-evaluation              AM-PAC OT "6 Clicks" Daily Activity     Outcome Measure Help from another person eating meals?: None Help from another person taking care of personal grooming?: A Little Help from another person toileting, which includes using toliet, bedpan, or urinal?: A Little Help from another person bathing (including washing, rinsing, drying)?: A Little Help from another person to put on and taking off regular upper body clothing?: A Little Help from another person to put on and taking off regular lower body clothing?: A Little 6 Click Score: 19   End of Session Equipment Utilized During Treatment: Rolling walker (2 wheels)  Activity Tolerance: Patient tolerated treatment well Patient left: in chair;with call bell/phone within reach  OT Visit Diagnosis: Other abnormalities of gait and mobility (R26.89);Repeated falls (R29.6);Muscle weakness (generalized) (M62.81);Pain Pain - Right/Left:  (lumbar back)                Time: [id number] OT Time Calculation (min): 30 min Charges:  OT General Charges $OT Visit: 1 Visit OT Evaluation $OT Eval Moderate Complexity: 1 Mod OT Treatments $Self Care/Home Management : 23-37 mins  Ardeth Perfect., MPH, MS, OTR/L ascom [phone] 10/05/22, 9:09 AM

## 2022-10-05 NOTE — Progress Notes (Signed)
    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  POD2: Having pain overnight. POD1: Doing well.  Had some pain overnight.  Physical Exam: Vitals:   10/05/22 0423 10/05/22 0735  BP: 136/71 (!) 112/59  Pulse: 88 92  Resp: 17 18  Temp: 98.2 F (36.8 C) 98.3 F (36.8 C)  SpO2: 100% 96%    AA Ox3 CNI  Strength:5/5 throughout Ble Incision c/d/i  Data:  Other tests/results: n/a  Assessment/Plan:  Tamara [name] is doing well after surgery.  - mobilize - pain control - will change pain meds today - DVT prophylaxis - PTOT   Meade Maw MD, [facility] Department of Neurosurgery

## 2022-10-06 MED ORDER — BACLOFEN 10 MG PO TABS: 10.0000 mg | ORAL_TABLET | Freq: Three times a day (TID) | ORAL | 0 refills | Status: AC | PRN

## 2022-10-06 MED ORDER — OXYCODONE-ACETAMINOPHEN 5-325 MG PO TABS: 1.0000 | ORAL_TABLET | ORAL | 0 refills | Status: AC | PRN

## 2022-10-06 MED ORDER — SENNA 8.6 MG PO TABS: 1.0000 | ORAL_TABLET | Freq: Two times a day (BID) | ORAL | 0 refills | Status: DC

## 2022-10-06 MED ORDER — CELECOXIB 200 MG PO CAPS: 200.0000 mg | ORAL_CAPSULE | Freq: Two times a day (BID) | ORAL | 0 refills | Status: AC

## 2022-10-06 NOTE — Plan of Care (Signed)

## 2022-10-06 NOTE — TOC Progression Note (Signed)
Transition of Care [facility]) - Progression Note    Patient Details  Name: Tamara [name] MRN: [id number] Date of Birth: 04-Feb-1946  Transition of Care [facility]) CM/SW [place], RN Phone Number: 10/06/2022, 10:33 AM  Clinical Narrative:    Spoke with Uvaldo Bristle the patient's geriatric case manager, he has arranged [facility] to pick up the patient and transport back top [facility] today, he was not provided a time for them to come, [facility] will call the nurses's desk upon arrival for the patient to be brought out to the medical mall entrance, the bedside nurse and charge nurse is aware   Expected Discharge Plan: Assisted Living Barriers to Discharge: Barriers Resolved  Expected Discharge Plan and Services Expected Discharge Plan: Assisted Living         Expected Discharge Date: 10/06/22                                     Social Determinants of Health (SDOH) Interventions Housing Interventions: Intervention Not Indicated, Patient Refused  Readmission Risk Interventions     No data to display

## 2022-10-06 NOTE — Discharge Summary (Signed)
Physician Discharge Summary  Patient ID: Tamara [name] MRN: [id number] DOB/AGE: [birth year] 76 y.o.  Admit date: 10/02/2022 Discharge date: 10/06/2022  Admission Diagnoses:Principal Problem:   Lumbar stenosis Active Problems:   Cauda equina compression (HCC)   Lumbar disc herniation with radiculopathy   Lower extremity numbness  Discharge Diagnoses:  Principal Problem:   Lumbar stenosis Active Problems:   Cauda equina compression (HCC)   Lumbar disc herniation with radiculopathy   Lower extremity numbness   Discharged Condition: good  Hospital Course: Ms. Breeding was admitted with worsening symptoms from cauda equina compression.  She underwent surgical intevention.  She had progressive improvement while in hospital and was cleared for discharge with home health.  Consults: None  Significant Diagnostic Studies: radiology: X-Ray: Localization  Treatments: surgery: L2-3 discectomy  Discharge Exam: Blood pressure (!) 112/52, pulse 98, temperature 97.8 F (36.6 C), temperature source Oral, resp. rate 16, height 5\' 7"  (1.702 m), weight 72.6 kg, SpO2 97 %. General appearance: alert and cooperative CNI MAEW Dressing c/d/i  Disposition: Discharge disposition: 01-Home or Self Care       Discharge Instructions     Discharge patient   Complete by: As directed    Discharge disposition: 01-Home or Self Care   Discharge patient date: 10/06/2022   Incentive spirometry RT   Complete by: As directed       Allergies as of 10/06/2022       Reactions   Ace Inhibitors Cough        Medication List     STOP taking these medications    ARIPiprazole 5 MG tablet Commonly known as: ABILIFY   DULoxetine 60 MG capsule Commonly known as: CYMBALTA   propranolol 20 MG tablet Commonly known as: INDERAL   traMADol 50 MG tablet Commonly known as: ULTRAM   traZODone 50 MG tablet Commonly known as: DESYREL       TAKE these medications    acetaminophen 325 MG  tablet Commonly known as: TYLENOL Take 650 mg by mouth every 6 (six) hours as needed.   aspirin 81 MG chewable tablet Chew 81 mg by mouth daily.   atorvastatin 20 MG tablet Commonly known as: LIPITOR Take 20 mg by mouth at bedtime.   baclofen 10 MG tablet Commonly known as: LIORESAL Take 1 tablet (10 mg total) by mouth 3 (three) times daily as needed for muscle spasms.   buPROPion HCl ER (XL) 450 MG Tb24 Take 450 mg by mouth daily.   busPIRone 10 MG tablet Commonly known as: BUSPAR Take 10 mg by mouth 2 (two) times daily.   calcitonin (salmon) 200 UNIT/ACT nasal spray Commonly known as: MIACALCIN/FORTICAL Place 1 spray into alternate nostrils daily.   celecoxib 200 MG capsule Commonly known as: CELEBREX Take 1 capsule (200 mg total) by mouth 2 (two) times daily.   diclofenac Sodium 1 % Gel Commonly known as: VOLTAREN Apply 2 g topically 4 (four) times daily.   esomeprazole 20 MG capsule Commonly known as: NEXIUM Take 20 mg by mouth daily. To prevent coughing after eating.   fluticasone 50 MCG/ACT nasal spray Commonly known as: FLONASE Place 1 spray into both nostrils 2 (two) times daily.   insulin glargine 100 UNIT/ML Solostar Pen Commonly known as: LANTUS Inject 10 Units into the skin daily. What changed: how much to take   lamoTRIgine 150 MG tablet Commonly known as: LAMICTAL Take 150 mg by mouth 2 (two) times daily.   levothyroxine 112 MCG tablet Commonly known as: SYNTHROID Take 112 mcg  by mouth daily.   magnesium oxide 400 MG tablet Commonly known as: MAG-OX Take 2 tablets by mouth daily.   metFORMIN 500 MG tablet Commonly known as: GLUCOPHAGE Take 500 mg by mouth 2 (two) times daily.   oxyCODONE-acetaminophen 5-325 MG tablet Commonly known as: PERCOCET/ROXICET Take 1-2 tablets by mouth every 4 (four) hours as needed for up to 7 days for moderate pain or severe pain. What changed:  how much to take reasons to take this   sennosides-docusate  sodium 8.6-50 MG tablet Commonly known as: SENOKOT-S Take 2 tablets by mouth daily.   tiZANidine 2 MG tablet Commonly known as: ZANAFLEX Take 0.5 tablets (1 mg total) by mouth at bedtime as needed.   valsartan 80 MG tablet Commonly known as: DIOVAN Take 80 mg by mouth daily.        Follow-up Information     Drake Leach, PA-C Follow up on 10/16/2022.   Specialty: Neurosurgery Why: 0930 Contact information: [address] [place] [address] [phone]                 Signed: Venetia Night 10/06/2022, 7:41 AM

## 2022-10-06 NOTE — Anesthesia Postprocedure Evaluation (Signed)
Anesthesia Post Note  Patient: Tamara [name]  Procedure(s) Performed: LUMBAR LAMINECTOMY/DECOMPRESSION MICRODISCECTOMY 1 LEVEL (Right)  Patient location during evaluation: PACU Anesthesia Type: General Level of consciousness: awake and alert Pain management: pain level controlled Vital Signs Assessment: post-procedure vital signs reviewed and stable Respiratory status: spontaneous breathing, nonlabored ventilation, respiratory function stable and patient connected to nasal cannula oxygen Cardiovascular status: blood pressure returned to baseline and stable Postop Assessment: no apparent nausea or vomiting Anesthetic complications: no   No notable events documented.   Last Vitals:  Vitals:   10/05/22 2338 10/06/22 0814  BP: (!) 112/52 107/63  Pulse: 98 91  Resp: 16 16  Temp: 36.6 C 36.8 C  SpO2: 97% 98%    Last Pain:  Vitals:   10/06/22 0720  TempSrc:   PainSc: 4                  Martha Clan

## 2022-10-06 NOTE — Plan of Care (Signed)
Problem: Education: Goal: Knowledge of General Education information will improve Description: Including pain rating scale, medication(s)/side effects and non-pharmacologic comfort measures 10/06/2022 1103 by Orma Render, RN Outcome: Adequate for Discharge 10/06/2022 0758 by Orma Render, RN Outcome: Progressing   Problem: Health Behavior/Discharge Planning: Goal: Ability to manage health-related needs will improve 10/06/2022 1103 by Orma Render, RN Outcome: Adequate for Discharge 10/06/2022 0758 by Orma Render, RN Outcome: Progressing   Problem: Clinical Measurements: Goal: Ability to maintain clinical measurements within normal limits will improve 10/06/2022 1103 by Orma Render, RN Outcome: Adequate for Discharge 10/06/2022 0758 by Orma Render, RN Outcome: Progressing Goal: Will remain free from infection 10/06/2022 1103 by Orma Render, RN Outcome: Adequate for Discharge 10/06/2022 0758 by Orma Render, RN Outcome: Progressing Goal: Diagnostic test results will improve 10/06/2022 1103 by Orma Render, RN Outcome: Adequate for Discharge 10/06/2022 0758 by Orma Render, RN Outcome: Progressing Goal: Respiratory complications will improve 10/06/2022 1103 by Orma Render, RN Outcome: Adequate for Discharge 10/06/2022 0758 by Orma Render, RN Outcome: Progressing Goal: Cardiovascular complication will be avoided 10/06/2022 1103 by Orma Render, RN Outcome: Adequate for Discharge 10/06/2022 0758 by Orma Render, RN Outcome: Progressing   Problem: Activity: Goal: Risk for activity intolerance will decrease 10/06/2022 1103 by Orma Render, RN Outcome: Adequate for Discharge 10/06/2022 0758 by Orma Render, RN Outcome: Progressing   Problem: Nutrition: Goal: Adequate nutrition will be maintained 10/06/2022 1103 by Orma Render, RN Outcome: Adequate for Discharge 10/06/2022 0758 by Orma Render, RN Outcome: Progressing   Problem: Coping: Goal: Level of anxiety  will decrease 10/06/2022 1103 by Orma Render, RN Outcome: Adequate for Discharge 10/06/2022 0758 by Orma Render, RN Outcome: Progressing   Problem: Elimination: Goal: Will not experience complications related to bowel motility 10/06/2022 1103 by Orma Render, RN Outcome: Adequate for Discharge 10/06/2022 0758 by Orma Render, RN Outcome: Progressing Goal: Will not experience complications related to urinary retention 10/06/2022 1103 by Orma Render, RN Outcome: Adequate for Discharge 10/06/2022 0758 by Orma Render, RN Outcome: Progressing   Problem: Pain Managment: Goal: General experience of comfort will improve 10/06/2022 1103 by Orma Render, RN Outcome: Adequate for Discharge 10/06/2022 0758 by Orma Render, RN Outcome: Progressing   Problem: Safety: Goal: Ability to remain free from injury will improve 10/06/2022 1103 by Orma Render, RN Outcome: Adequate for Discharge 10/06/2022 0758 by Orma Render, RN Outcome: Progressing   Problem: Skin Integrity: Goal: Risk for impaired skin integrity will decrease 10/06/2022 1103 by Orma Render, RN Outcome: Adequate for Discharge 10/06/2022 0758 by Orma Render, RN Outcome: Progressing   Problem: Education: Goal: Ability to verbalize activity precautions or restrictions will improve 10/06/2022 1103 by Orma Render, RN Outcome: Adequate for Discharge 10/06/2022 0758 by Orma Render, RN Outcome: Progressing Goal: Knowledge of the prescribed therapeutic regimen will improve 10/06/2022 1103 by Orma Render, RN Outcome: Adequate for Discharge 10/06/2022 0758 by Orma Render, RN Outcome: Progressing Goal: Understanding of discharge needs will improve 10/06/2022 1103 by Orma Render, RN Outcome: Adequate for Discharge 10/06/2022 0758 by Orma Render, RN Outcome: Progressing   Problem: Activity: Goal: Ability to avoid complications of mobility impairment will improve 10/06/2022 1103 by Orma Render, RN Outcome:  Adequate for Discharge 10/06/2022 0758 by Orma Render, RN Outcome: Progressing Goal: Ability to tolerate increased activity will improve 10/06/2022  1103 by Alferd Apa, RN Outcome: Adequate for Discharge 10/06/2022 0758 by Alferd Apa, RN Outcome: Progressing Goal: Will remain free from falls 10/06/2022 1103 by Alferd Apa, RN Outcome: Adequate for Discharge 10/06/2022 0758 by Alferd Apa, RN Outcome: Progressing   Problem: Bowel/Gastric: Goal: Gastrointestinal status for postoperative course will improve 10/06/2022 1103 by Alferd Apa, RN Outcome: Adequate for Discharge 10/06/2022 0758 by Alferd Apa, RN Outcome: Progressing   Problem: Clinical Measurements: Goal: Ability to maintain clinical measurements within normal limits will improve 10/06/2022 1103 by Alferd Apa, RN Outcome: Adequate for Discharge 10/06/2022 0758 by Alferd Apa, RN Outcome: Progressing Goal: Postoperative complications will be avoided or minimized 10/06/2022 1103 by Alferd Apa, RN Outcome: Adequate for Discharge 10/06/2022 0758 by Alferd Apa, RN Outcome: Progressing Goal: Diagnostic test results will improve 10/06/2022 1103 by Alferd Apa, RN Outcome: Adequate for Discharge 10/06/2022 0758 by Alferd Apa, RN Outcome: Progressing   Problem: Pain Management: Goal: Pain level will decrease 10/06/2022 1103 by Alferd Apa, RN Outcome: Adequate for Discharge 10/06/2022 0758 by Alferd Apa, RN Outcome: Progressing   Problem: Skin Integrity: Goal: Will show signs of wound healing 10/06/2022 1103 by Alferd Apa, RN Outcome: Adequate for Discharge 10/06/2022 0758 by Alferd Apa, RN Outcome: Progressing   Problem: Health Behavior/Discharge Planning: Goal: Identification of resources available to assist in meeting health care needs will improve 10/06/2022 1103 by Alie Moudy, Alfredo Batty, RN Outcome: Adequate for Discharge 10/06/2022 0758 by Alferd Apa, RN Outcome: Progressing    Problem: Bladder/Genitourinary: Goal: Urinary functional status for postoperative course will improve 10/06/2022 1103 by Alferd Apa, RN Outcome: Adequate for Discharge 10/06/2022 0758 by Alferd Apa, RN Outcome: Progressing   Problem: Activity: Goal: Ability to avoid complications of mobility impairment will improve 10/06/2022 1103 by Alferd Apa, RN Outcome: Adequate for Discharge 10/06/2022 0758 by Alferd Apa, RN Outcome: Progressing Goal: Ability to tolerate increased activity will improve 10/06/2022 1103 by Alferd Apa, RN Outcome: Adequate for Discharge 10/06/2022 0758 by Alferd Apa, RN Outcome: Progressing Goal: Will remain free from falls 10/06/2022 1103 by Alferd Apa, RN Outcome: Adequate for Discharge 10/06/2022 0758 by Alferd Apa, RN Outcome: Progressing   Problem: Bowel/Gastric: Goal: Gastrointestinal status for postoperative course will improve 10/06/2022 1103 by Alferd Apa, RN Outcome: Adequate for Discharge 10/06/2022 0758 by Alferd Apa, RN Outcome: Progressing

## 2022-10-06 NOTE — Plan of Care (Signed)
  Problem: Education: Goal: Knowledge of General Education information will improve Description: Including pain rating scale, medication(s)/side effects and non-pharmacologic comfort measures Outcome: Progressing   Problem: Health Behavior/Discharge Planning: Goal: Ability to manage health-related needs will improve Outcome: Progressing   Problem: Clinical Measurements: Goal: Ability to maintain clinical measurements within normal limits will improve Outcome: Progressing Goal: Will remain free from infection Outcome: Progressing Goal: Diagnostic test results will improve Outcome: Progressing Goal: Respiratory complications will improve Outcome: Progressing Goal: Cardiovascular complication will be avoided Outcome: Progressing   Problem: Activity: Goal: Risk for activity intolerance will decrease Outcome: Progressing   Problem: Nutrition: Goal: Adequate nutrition will be maintained Outcome: Progressing   Problem: Coping: Goal: Level of anxiety will decrease Outcome: Progressing   Problem: Elimination: Goal: Will not experience complications related to bowel motility Outcome: Progressing Goal: Will not experience complications related to urinary retention Outcome: Progressing   Problem: Pain Managment: Goal: General experience of comfort will improve Outcome: Progressing   Problem: Safety: Goal: Ability to remain free from injury will improve Outcome: Progressing   Problem: Skin Integrity: Goal: Risk for impaired skin integrity will decrease Outcome: Progressing   Problem: Education: Goal: Ability to verbalize activity precautions or restrictions will improve Outcome: Progressing Goal: Knowledge of the prescribed therapeutic regimen will improve Outcome: Progressing Goal: Understanding of discharge needs will improve Outcome: Progressing   Problem: Activity: Goal: Ability to avoid complications of mobility impairment will improve Outcome: Progressing Goal:  Ability to tolerate increased activity will improve Outcome: Progressing Goal: Will remain free from falls Outcome: Progressing   Problem: Bowel/Gastric: Goal: Gastrointestinal status for postoperative course will improve Outcome: Progressing   Problem: Clinical Measurements: Goal: Ability to maintain clinical measurements within normal limits will improve Outcome: Progressing Goal: Postoperative complications will be avoided or minimized Outcome: Progressing Goal: Diagnostic test results will improve Outcome: Progressing   Problem: Pain Management: Goal: Pain level will decrease Outcome: Progressing   Problem: Skin Integrity: Goal: Will show signs of wound healing Outcome: Progressing   Problem: Health Behavior/Discharge Planning: Goal: Identification of resources available to assist in meeting health care needs will improve Outcome: Progressing   Problem: Bladder/Genitourinary: Goal: Urinary functional status for postoperative course will improve Outcome: Progressing   Problem: Activity: Goal: Ability to avoid complications of mobility impairment will improve Outcome: Progressing Goal: Ability to tolerate increased activity will improve Outcome: Progressing Goal: Will remain free from falls Outcome: Progressing   Problem: Bowel/Gastric: Goal: Gastrointestinal status for postoperative course will improve Outcome: Progressing

## 2022-10-06 NOTE — Progress Notes (Signed)
    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  POD3: Did better yesterday POD2: Having pain overnight. POD1: Doing well.  Had some pain overnight.  Physical Exam: Vitals:   10/05/22 1627 10/05/22 2338  BP: 110/71 (!) 112/52  Pulse: 91 98  Resp:  16  Temp: 97.7 F (36.5 C) 97.8 F (36.6 C)  SpO2: 97% 97%    AA Ox3 CNI  Strength:5/5 throughout Ble Incision c/d/i  Data:  Other tests/results: n/a  Assessment/Plan:  Tamara [name] is doing well after surgery.  - mobilize - pain control - will change pain meds today - DVT prophylaxis - PTOT   Meade Maw MD, [facility] Department of Neurosurgery

## 2022-10-06 NOTE — TOC Progression Note (Signed)
Transition of Care [facility]) - Progression Note    Patient Details  Name: Tamara [name] MRN: [id number] Date of Birth: 19-Nov-1946  Transition of Care [facility]) CM/SW [place], RN Phone Number: 10/06/2022, 9:38 AM  Clinical Narrative:     Spoke with Kyra Manges at [facility], she stated that I need to call Juliane Lack to arrange transport, Mathew's number is [phone], Nurse called Juliane Lack to arrnage, Sent DC summary thru the [facility]       Expected Discharge Plan and Services           Expected Discharge Date: 10/06/22                                     Social Determinants of Health (SDOH) Interventions Housing Interventions: Intervention Not Indicated, Patient Refused  Readmission Risk Interventions     No data to display

## 2022-10-06 NOTE — Progress Notes (Signed)
Report called to [facility].

## 2022-10-06 NOTE — Care Management Important Message (Signed)
Important Message  Patient Details  Name: Tamara [name] MRN: [id number] Date of Birth: [birth year]   Medicare Important Message Given:  N/A - LOS <3 / Initial given by admissions     [name] Yosiah Jasmin 10/06/2022, 9:33 AM

## 2022-10-06 NOTE — Progress Notes (Signed)
Physical Therapy Treatment Patient Details Name: Tamara [name] MRN: [id number] DOB: [birth year] Today's Date: 10/06/2022   History of Present Illness 76 yo female s/p lumbar laminectomy/decompression 1 level L2/3 on right. PMH includes HTN, DM, hypothyroidism, renal disease    PT Comments    Pt found in chair upon PT entry. Sit<>Stand with CGA and single UE supported for balance. Pt ambulated 224ft with RW, CGA, and min VC for posture. Pt educated on BLTs and able to demonstrate/verbalize understanding. Pt reported "unsteadiness" and right hip pain during ambulation, but no unsteadiness was noticed by PT and pain was monitored during the session.  Pt would benefit from skilled physical therapy to address listed deficits (see below) to increase independence with ADLs and safety. Current recommendation is for pt to return to ALF with HHPT.    Recommendations for follow up therapy are one component of a multi-disciplinary discharge planning process, led by the attending physician.  Recommendations may be updated based on patient status, additional functional criteria and insurance authorization.  Follow Up Recommendations  Home health PT     Assistance Recommended at Discharge Frequent or constant Supervision/Assistance  Patient can return home with the following A little help with walking and/or transfers;A little help with bathing/dressing/bathroom;Help with stairs or ramp for entrance;Assist for transportation;Assistance with cooking/housework;Direct supervision/assist for medications management   Equipment Recommendations  None recommended by PT    Recommendations for Other Services       Precautions / Restrictions Precautions Precautions: Fall;Back Precaution Booklet Issued: No Precaution Comments: BLT Restrictions Weight Bearing Restrictions: No     Mobility  Bed Mobility                    Transfers Overall transfer level: Needs assistance   Transfers: Sit  to/from Stand Sit to Stand: Min guard           General transfer comment: pt stood without RW    Ambulation/Gait Ambulation/Gait assistance: Min guard Gait Distance (Feet): 220 Feet Assistive device: Rolling walker (2 wheels) Gait Pattern/deviations: Step-through pattern, Decreased stance time - right, Decreased stance time - left, Decreased step length - left, Decreased step length - right       General Gait Details: pt able to correct kyphotic posture with min VC. No cueing required on RW safety   Stairs             Wheelchair Mobility    Modified Rankin (Stroke Patients Only)       Balance Overall balance assessment: Needs assistance Sitting-balance support: Feet supported Sitting balance-Leahy Scale: Good     Standing balance support: Single extremity supported Standing balance-Leahy Scale: Fair Standing balance comment: able to sit<>stand with single UE supported on window ledge                            Cognition Arousal/Alertness: Awake/alert Behavior During Therapy: WFL for tasks assessed/performed Overall Cognitive Status: Within Functional Limits for tasks assessed                                          Exercises General Exercises - Lower Extremity Long Arc Quad: AROM, Strengthening, Right, Left, 10 reps    General Comments        Pertinent Vitals/Pain Pain Assessment Pain Assessment: Faces Faces Pain Scale: Hurts a little bit Pain Location: right  hip Pain Descriptors / Indicators: Aching, Grimacing Pain Intervention(s): Premedicated before session, Monitored during session    Home Living                          Prior Function            PT Goals (current goals can now be found in the care plan section) Acute Rehab PT Goals Patient Stated Goal: to go home PT Goal Formulation: With patient Progress towards PT goals: Progressing toward goals    Frequency    7X/week      PT  Plan Current plan remains appropriate    Co-evaluation              AM-PAC PT "6 Clicks" Mobility   Outcome Measure  Help needed turning from your back to your side while in a flat bed without using bedrails?: A Little Help needed moving from lying on your back to sitting on the side of a flat bed without using bedrails?: A Little Help needed moving to and from a bed to a chair (including a wheelchair)?: A Little Help needed standing up from a chair using your arms (e.g., wheelchair or bedside chair)?: A Little Help needed to walk in hospital room?: A Little Help needed climbing 3-5 steps with a railing? : A Little 6 Click Score: 18    End of Session Equipment Utilized During Treatment: Gait belt Activity Tolerance: Patient tolerated treatment well Patient left: in chair;with call bell/phone within reach;with chair alarm set Nurse Communication: Mobility status PT Visit Diagnosis: Unsteadiness on feet (R26.81);Pain;Difficulty in walking, not elsewhere classified (R26.2) Pain - Right/Left: Right Pain - part of body: Hip     Time: 9798-9211 PT Time Calculation (min) (ACUTE ONLY): 13 min  Charges:                          Claiborne Billings O'Daniel, SPT 10/06/2022, 9:05 AM

## 2022-10-06 NOTE — Discharge Instructions (Signed)
Your surgeon has performed an operation on your lumbar spine (low back) to relieve pressure on one or more nerves. Many times, patients feel better immediately after surgery and can "overdo it." Even if you feel well, it is important that you follow these activity guidelines. If you do not let your back heal properly from the surgery, you can increase the chance of a disc herniation and/or return of your symptoms. The following are instructions to help in your recovery once you have been discharged from the hospital.  * It is ok to take NSAIDs after surgery.  Activity    No bending, lifting, or twisting ("BLT"). Avoid lifting objects heavier than 10 pounds (gallon milk jug).  Where possible, avoid household activities that involve lifting, bending, pushing, or pulling such as laundry, vacuuming, grocery shopping, and childcare. Try to arrange for help from friends and family for these activities while your back heals.  Increase physical activity slowly as tolerated.  Taking short walks is encouraged, but avoid strenuous exercise. Do not jog, run, bicycle, lift weights, or participate in any other exercises unless specifically allowed by your doctor. Avoid prolonged sitting, including car rides.  Talk to your doctor before resuming sexual activity.  You should not drive until cleared by your doctor.  Until released by your doctor, you should not return to work or school.  You should rest at home and let your body heal.   You may shower two days after your surgery.  After showering, lightly dab your incision dry. Do not take a tub bath or go swimming for 3 weeks, or until approved by your doctor at your follow-up appointment.  If you smoke, we strongly recommend that you quit.  Smoking has been proven to interfere with normal healing in your back and will dramatically reduce the success rate of your surgery. Please contact [facility] (800-QUIT-NOW) and use the resources at www.[facility].com for  assistance in stopping smoking.  Surgical Incision   If you have a dressing on your incision, you may remove it three days after your surgery. Keep your incision area clean and dry.  If you have staples or stitches on your incision, you should have a follow up scheduled for removal. If you do not have staples or stitches, you will have steri-strips (small pieces of surgical tape) or Dermabond glue. The steri-strips/glue should begin to peel away within about a week (it is fine if the steri-strips fall off before then). If the strips are still in place one week after your surgery, you may gently remove them.  Diet            You may return to your usual diet. Be sure to stay hydrated.  When to Contact Us  Although your surgery and recovery will likely be uneventful, you may have some residual numbness, aches, and pains in your back and/or legs. This is normal and should improve in the next few weeks.  However, should you experience any of the following, contact us immediately: New numbness or weakness Pain that is progressively getting worse, and is not relieved by your pain medications or rest Bleeding, redness, swelling, pain, or drainage from surgical incision Chills or flu-like symptoms Fever greater than 101.0 F (38.3 C) Problems with bowel or bladder functions Difficulty breathing or shortness of breath Warmth, tenderness, or swelling in your calf  Contact Information During office hours (Monday-Friday 9 am to 5 pm), please call your physician at [phone] and ask for Kendelyn Jean After hours and   weekends, please call [phone] and speak with the neurosurgeon on call For a life-threatening emergency, call 911 

## 2022-10-14 NOTE — Progress Notes (Addendum)
REFERRING PHYSICIAN:  [facility] [address] [place],  [place] [address]  DOS: 10/03/22  L2-L3 discectomy (with Dr. Adriana Simas)  HISTORY OF PRESENT ILLNESS: Tamara [name] is approximately 2 weeks status post L2-L3 discectomy. Was given baclofen, celebrex, and percocet on discharge from the hospital.   She stays at [facility], but did not bring any paperwork with her today. Geriatric care manager is with her but does not know what medications she is taking. Called facility to get this faxed to [place] [facility] Landry [phone]).   She was seen in ED yesterday with numbness in both legs/hands and was discharged.  She was having right leg pain and numbness in both legs prior to her surgery per her records, but states numbness in right leg is worse. She is having mild LBP as well.   No bowel or bladder issues. No perineal numbness.   She doesn't feel well overall and feels like something is wrong. She is dizzy, light headed, and feels very weak. She is feeling nauseated as well.   PHYSICAL EXAMINATION:  General: Patient is well developed, well nourished.   NEUROLOGICAL:  General: In no acute distress.   Awake, alert, oriented to person, place, and time.  Pupils equal round and reactive to light.  Facial tone is symmetric.    She appears anxious.    Strength:            Side Iliopsoas Quads Hamstring PF DF EHL  R 5 5 5 5 5 5   L 5 5 5 5 5 5    Incision c/d/I  Sensation diminished in right leg (entire) compared to left  Strength in bilateral upper extremities in 5/5, sensation diminished in both hands.   All four extremities are warm to the touch.   ROS (Neurologic):  Negative except as noted above  IMAGING: Addendum 10/16/22:  CT of lumbar spine dated 10/15/22:  FINDINGS: Segmentation: There are 5 non-rib-bearing lumbar-type vertebral bodies.   Alignment: 4 mm retrolisthesis of L1 on L2 is slightly increased from 3 mm on 07/21/2022. 4 mm retrolisthesis of L2 on L3 and  3 mm grade 1 anterolisthesis of L5 on S1, unchanged.   Vertebrae: Postsurgical changes of L3 through S1 bilateral transpedicular rod and screw fusion with associated intervertebral disc spacers, as on prior. Anterior L5-S1 fixation screws are again seen. No evidence of hardware failure.   There is worsened severe L1-2 disc space narrowing. Previously there was moderate cystic change/past degenerative erosion of the anterior inferior left L1 vertebral body, and this is not simply changed from 07/21/2012. Severe right L1-2 disc space narrowing with bone-on-bone contact, subchondral sclerosis, and minimal early degenerative erosion is worsened from 07/21/2022. There is mild horizontal curvilinear lucency within the anterior inferior right L1 vertebral body that may represent subacute fracturing. No surrounding soft tissue inflammatory changes are seen to specifically indicate discitis/osteomyelitis. Approximately 7 mm right lateral listhesis of L1 on L2 is minimally increased from 5 mm previously.   Mild-to-moderate T10-11, moderate T11-12, and moderate to severe T12-L1 disc space narrowing is similar to prior. Moderate anterior inferior T12 endplate degenerative Schmorl's nodes. Moderate multilevel degenerative bridging osteophytes from the T10-11 through T12-L1 level, similar to prior. Moderate to severe right-greater-than-left L2-3 disc space narrowing.   Paraspinal and other soft tissues: Mild abdominal aorta atherosclerotic calcifications. No ovarian suffering aortic left renal vein. Fluid density partially exophytic 1.7 cm posterior right renal cyst is similar to prior. No follow-up imaging is recommended. Mild-to-moderate chronic right hydroureter  is unchanged from 07/21/2022. No renal stone is seen. Moderate to high-grade sigmoid diverticulosis without definite inflammatory change.   Disc levels: T12-L1: Mild-to-moderate bilateral facet joint hypertrophy. Mild retrolisthesis.  Moderate broad-based posterior disc osteophyte complex. Severe left and moderate right neuroforaminal stenosis. Moderate central canal stenosis.   L1-2: Moderate bilateral facet joint hypertrophy. Mild retrolisthesis. Mild to moderate broad-based posterior disc osteophyte complex. Moderate right and mild left neuroforaminal stenosis. Moderate central canal stenosis, similar to prior.   L2-3: Status post right hemilaminotomy. Moderate to severe bilateral facet joint hypertrophy. Moderate broad-based posterior disc osteophyte complex. Moderate bilateral neuroforaminal stenosis. Mild-to-moderate central canal stenosis.   L3-4: Status post discectomy and posterior instrumented fusion. Moderate bilateral facet joint hypertrophy. The central canal and bilateral neural foramina appear patent.   L4-5: Moderate to severe bilateral facet joint hypertrophy. Status post discectomy and posterior instrumented fusion. Unchanged moderate to severe right neuroforaminal stenosis. The left neural foramina is patent. Mild central canal stenosis.   L5-S1: Mild bilateral facet joint hypertrophy. Grade 1 anterolisthesis. Status post discectomy and posterior instrumented fusion. Mild-to-moderate bilateral neuroforaminal narrowing, unchanged. No central canal stenosis.   IMPRESSION: Compared to 07/21/2022:   1. Redemonstration of L3 through S1 bilateral transpedicular rod and screw fusion with intervertebral disc spacers. No evidence of hardware failure. 2. Worsened severe L1-2 disc space narrowing and right-greater-than-left L1-2 disc space narrowing with bone-on-bone contact. Unchanged mild high-grade inferior left L1 vertebral body degenerate erosions with new mild inferior anterior right L1 vertebral body degenerative erosions. These findings are favored to be secondary to worsening degenerative disc and endplate changes without infection. No surrounding soft tissue inflammatory changes are seen  to specifically indicate discitis/osteomyelitis, however recommend clinical correlation. 3. Multilevel degenerative disc and joint changes and central canal and neuroforaminal stenoses as above. 4. Mild-to-moderate chronic right hydroureter, similar to 07/21/2022. No renal or ureteral stone is seen.     Electronically Signed   By: Neita Garnet M.D.   On: 10/15/2022 12:11  I have personally reviewed the images and agree with the above interpretation.     ASSESSMENT/PLAN:  Hendel Gatliff is doing well s/p above surgery. Treatment options reviewed with patient and following plan made:   - Recommend she go to the ED as she continues to feel worse during her visit. She and her geriatric care manager requested we call EMS for non emergency transport. This was done.  - Discussed that leg numbness should continue to improve.  - Continue on celebrex, prn baclofen, and prn oxycodone.  - I have advised the patient to lift up to 10 pounds until 6 weeks after surgery (follow up with Dr. Myer Haff).  - Reviewed wound care. Okay to shower.  - No bending, twisting, or lifting.  - Follow up as scheduled in 4 weeks and prn.   She has follow up with Dr. Myer Haff on Tuesday.   Advised to contact the office if any questions or concerns arise.  Drake Leach PA-C Department of neurosurgery

## 2022-10-15 ENCOUNTER — Emergency Department: Payer: Medicare Other

## 2022-10-15 ENCOUNTER — Other Ambulatory Visit: Payer: Self-pay

## 2022-10-15 ENCOUNTER — Emergency Department
Admission: EM | Admit: 2022-10-15 | Discharge: 2022-10-15 | Disposition: A | Discharge status: Skilled Nursing Facility | Payer: Medicare Other | Attending: Emergency Medicine | Admitting: Emergency Medicine

## 2022-10-15 DIAGNOSIS — R109 Unspecified abdominal pain: Secondary | ICD-10-CM | POA: Insufficient documentation

## 2022-10-15 DIAGNOSIS — F0390 Unspecified dementia without behavioral disturbance: Secondary | ICD-10-CM

## 2022-10-15 DIAGNOSIS — I129 Hypertensive chronic kidney disease with stage 1 through stage 4 chronic kidney disease, or unspecified chronic kidney disease: Secondary | ICD-10-CM | POA: Insufficient documentation

## 2022-10-15 DIAGNOSIS — R202 Paresthesia of skin: Secondary | ICD-10-CM

## 2022-10-15 DIAGNOSIS — Z20822 Contact with and (suspected) exposure to covid-19: Secondary | ICD-10-CM | POA: Insufficient documentation

## 2022-10-15 DIAGNOSIS — N189 Chronic kidney disease, unspecified: Secondary | ICD-10-CM | POA: Diagnosis not present

## 2022-10-15 DIAGNOSIS — E1122 Type 2 diabetes mellitus with diabetic chronic kidney disease: Secondary | ICD-10-CM | POA: Diagnosis not present

## 2022-10-15 DIAGNOSIS — Z794 Long term (current) use of insulin: Secondary | ICD-10-CM | POA: Diagnosis not present

## 2022-10-15 DIAGNOSIS — E119 Type 2 diabetes mellitus without complications: Secondary | ICD-10-CM

## 2022-10-15 LAB — CBC WITH DIFFERENTIAL/PLATELET
Abs Immature Granulocytes: 0.02 10*3/uL (ref 0.00–0.07)
Basophils Absolute: 0.1 10*3/uL (ref 0.0–0.1)
Basophils Relative: 1 %
Eosinophils Absolute: 0.2 10*3/uL (ref 0.0–0.5)
Eosinophils Relative: 4 %
HCT: 34.2 % — ABNORMAL LOW (ref 36.0–46.0)
Hemoglobin: 11.2 g/dL — ABNORMAL LOW (ref 12.0–15.0)
Immature Granulocytes: 0 %
Lymphocytes Relative: 29 %
Lymphs Abs: 1.6 10*3/uL (ref 0.7–4.0)
MCH: 28 pg (ref 26.0–34.0)
MCHC: 32.7 g/dL (ref 30.0–36.0)
MCV: 85.5 fL (ref 80.0–100.0)
Monocytes Absolute: 0.4 10*3/uL (ref 0.1–1.0)
Monocytes Relative: 8 %
Neutro Abs: 3.2 10*3/uL (ref 1.7–7.7)
Neutrophils Relative %: 58 %
Platelets: 507 10*3/uL — ABNORMAL HIGH (ref 150–400)
RBC: 4 MIL/uL (ref 3.87–5.11)
RDW: 16.8 % — ABNORMAL HIGH (ref 11.5–15.5)
WBC: 5.5 10*3/uL (ref 4.0–10.5)
nRBC: 0 % (ref 0.0–0.2)

## 2022-10-15 LAB — URINALYSIS, ROUTINE W REFLEX MICROSCOPIC
Bilirubin Urine: NEGATIVE
Glucose, UA: NEGATIVE mg/dL
Hgb urine dipstick: NEGATIVE
Ketones, ur: NEGATIVE mg/dL
Leukocytes,Ua: NEGATIVE
Nitrite: NEGATIVE
Protein, ur: NEGATIVE mg/dL
Specific Gravity, Urine: 1.017 (ref 1.005–1.030)
pH: 8 (ref 5.0–8.0)

## 2022-10-15 LAB — COMPREHENSIVE METABOLIC PANEL
ALT: 14 U/L (ref 0–44)
AST: 22 U/L (ref 15–41)
Albumin: 4 g/dL (ref 3.5–5.0)
Alkaline Phosphatase: 85 U/L (ref 38–126)
Anion gap: 12 (ref 5–15)
BUN: 13 mg/dL (ref 8–23)
CO2: 23 mmol/L (ref 22–32)
Calcium: 9.9 mg/dL (ref 8.9–10.3)
Chloride: 103 mmol/L (ref 98–111)
Creatinine, Ser: 0.81 mg/dL (ref 0.44–1.00)
GFR, Estimated: >60 mL/min (ref >60)
Glucose, Bld: 131 mg/dL — ABNORMAL HIGH (ref 70–99)
Potassium: 4.2 mmol/L (ref 3.5–5.1)
Sodium: 138 mmol/L (ref 135–145)
Total Bilirubin: 0.5 mg/dL (ref 0.3–1.2)
Total Protein: 7.5 g/dL (ref 6.5–8.1)

## 2022-10-15 LAB — RESP PANEL BY RT-PCR (FLU A&B, COVID) ARPGX2
Influenza A by PCR: NEGATIVE
Influenza B by PCR: NEGATIVE
SARS Coronavirus 2 by RT PCR: NEGATIVE

## 2022-10-15 LAB — LIPASE, BLOOD: Lipase: 32 U/L (ref 11–51)

## 2022-10-15 MED ORDER — SODIUM CHLORIDE 0.9 % IV BOLUS
500.0000 mL | Freq: Once | INTRAVENOUS | Status: AC
  Administered 2022-10-15: 500 mL via INTRAVENOUS

## 2022-10-15 MED ORDER — HALOPERIDOL LACTATE 5 MG/ML IJ SOLN
2.0000 mg | Freq: Once | INTRAMUSCULAR | Status: AC
  Administered 2022-10-15: 2 mg via INTRAVENOUS
  Filled 2022-10-15: qty 1

## 2022-10-15 MED ORDER — IOHEXOL 300 MG/ML  SOLN
100.0000 mL | Freq: Once | INTRAMUSCULAR | Status: AC | PRN
  Administered 2022-10-15: 100 mL via INTRAVENOUS

## 2022-10-15 MED ORDER — FENTANYL CITRATE PF 50 MCG/ML IJ SOSY
50.0000 ug | PREFILLED_SYRINGE | Freq: Once | INTRAMUSCULAR | Status: AC
  Administered 2022-10-15: 50 ug via INTRAVENOUS
  Filled 2022-10-15: qty 1

## 2022-10-15 NOTE — ED Provider Notes (Addendum)
[facility] Provider Note    Event Date/Time   First MD Initiated Contact with Patient 10/15/22 [phone]     (approximate)   History   Chief Complaint: Numbness (Bilateral legs)   HPI  Tamara [name] is a 76 y.o. female with a history of hypertension, CKD, cognitive impairment, diabetes on insulin who is brought to the ED today due to complaining of bilateral lower leg paresthesia and bilateral hand paresthesia.  No falls or head trauma.  No neck pain.  No vision changes or headaches.  No motor weakness.  She recently had lumbar spine fusion surgery by Dr. Marcell Barlow.  No fever chest pain shortness of breath     Physical Exam   Triage Vital Signs: ED Triage Vitals  Enc Vitals Group     BP      Pulse      Resp      Temp      Temp src      SpO2      Weight      Height      Head Circumference      Peak Flow      Pain Score      Pain Loc      Pain Edu?      Excl. in GC?     Most recent vital signs: Vitals:   10/15/22 1036  BP: (!) 146/68  Pulse: 76  Temp: 98.4 F (36.9 C)  SpO2: 99%    General: Awake, no distress.  Anxious  CV:  Good peripheral perfusion.  Regular rate and rhythm.  Normal distal pulses Resp:  Normal effort.  Clear to auscultation bilaterally Abd:  No distention.  Soft with mild lower abdominal tenderness Other:  Full range of motion, normal motor strength all extremities.  Symmetric sensation.  Normal reflexes.  No evidence of trauma.  No midline spinal tenderness.   ED Results / Procedures / Treatments   Labs (all labs ordered are listed, but only abnormal results are displayed) Labs Reviewed  COMPREHENSIVE METABOLIC PANEL - Abnormal; Notable for the following components:      Result Value   Glucose, Bld 131 (*)    All other components within normal limits  CBC WITH DIFFERENTIAL/PLATELET - Abnormal; Notable for the following components:   Hemoglobin 11.2 (*)    HCT 34.2 (*)    RDW 16.8 (*)    Platelets 507 (*)     All other components within normal limits  URINALYSIS, ROUTINE W REFLEX MICROSCOPIC - Abnormal; Notable for the following components:   Color, Urine STRAW (*)    APPearance CLEAR (*)    All other components within normal limits  RESP PANEL BY RT-PCR (FLU A&B, COVID) ARPGX2  URINE CULTURE  LIPASE, BLOOD     EKG Interpreted by me Sinus rhythm rate of 79.  Normal axis, normal intervals.  Poor R wave progression.  Normal ST segments and T waves.   RADIOLOGY Chest x-ray interpreted by me, appears normal.  Radiology report reviewed.  CT abdomen pelvis unremarkable for acute findings.  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  Lumbar fusion hardware in place.  Not concerning for discitis or osteomyelitis.   PROCEDURES:  Procedures   MEDICATIONS ORDERED IN ED: Medications  sodium chloride 0.9 % bolus 500 mL (0 mLs Intravenous Stopped 10/15/22 1119)  haloperidol lactate (HALDOL) injection 2 mg (2 mg Intravenous Given 10/15/22 1117)  iohexol (OMNIPAQUE) 300 MG/ML solution 100 mL (100 mLs Intravenous  Contrast Given 10/15/22 1057)  fentaNYL (SUBLIMAZE) injection 50 mcg (50 mcg Intravenous Given 10/15/22 1141)     IMPRESSION / MDM / ASSESSMENT AND PLAN / ED COURSE  I reviewed the triage vital signs and the nursing notes.                              Differential diagnosis includes, but is not limited to, vertebral fracture, nerve impingement syndrome, electrolyte abnormality, AKI, anemia, UTI, ureterolithiasis, diverticulitis  Patient's presentation is most consistent with acute presentation with potential threat to life or bodily function.  Patient presents with paresthesias in bilateral lower legs and bilateral hands.  Also found to have some abdominal tenderness on exam.  She appears very anxious which may be contributing to her paresthesias.  Labs and CT imaging all unremarkable.  Discussed with the patient and her geriatric care manager at bedside, agreeable for  discharge home and outpatient follow-up.  She does have a follow-up appointment with surgery tomorrow.       FINAL CLINICAL IMPRESSION(S) / ED DIAGNOSES   Final diagnoses:  Paresthesia  Chronic dementia (HCC)  Type 2 diabetes mellitus without complication, with long-term current use of insulin (HCC)     Rx / DC Orders   ED Discharge Orders     None        Note:  This document was prepared using Dragon voice recognition software and may include unintentional dictation errors.   Sharman Cheek, MD 10/15/22 1245    Sharman Cheek, MD 10/15/22 1246

## 2022-10-15 NOTE — ED Notes (Signed)
[facility] called for transport to [facility]/Rep:Amy

## 2022-10-15 NOTE — ED Triage Notes (Signed)
Pt from [facility]. Recent Lumbar Disc surgery. Pt stated her feet were swollen last night and today both legs became numb. While enroute to the ED Pt stated that her right arm was becoming numb as well.

## 2022-10-15 NOTE — ED Notes (Signed)
Called to [facility] to cancel transport /rep:Ladona Ridgel

## 2022-10-15 NOTE — Discharge Instructions (Signed)
Your lab tests and CT of the abdomen, pelvis, and lower back are all okay.  Please continue to follow-up with your doctor and continue taking all of your medications.

## 2022-10-16 ENCOUNTER — Emergency Department
Admission: EM | Admit: 2022-10-16 | Discharge: 2022-10-16 | Disposition: A | Discharge status: Skilled Nursing Facility | Payer: Medicare Other | Attending: Student in an Organized Health Care Education/Training Program | Admitting: Student in an Organized Health Care Education/Training Program

## 2022-10-16 ENCOUNTER — Ambulatory Visit (INDEPENDENT_AMBULATORY_CARE_PROVIDER_SITE_OTHER): Payer: Medicare Other | Admitting: Orthopedic Surgery

## 2022-10-16 ENCOUNTER — Other Ambulatory Visit: Payer: Self-pay

## 2022-10-16 ENCOUNTER — Encounter: Payer: Self-pay | Admitting: Orthopedic Surgery

## 2022-10-16 VITALS — BP 134/81 | HR 81 | Temp 97.7°F | Ht 67.0 in | Wt 140.0 lb

## 2022-10-16 DIAGNOSIS — Z09 Encounter for follow-up examination after completed treatment for conditions other than malignant neoplasm: Secondary | ICD-10-CM

## 2022-10-16 DIAGNOSIS — Z9889 Other specified postprocedural states: Secondary | ICD-10-CM

## 2022-10-16 DIAGNOSIS — M5116 Intervertebral disc disorders with radiculopathy, lumbar region: Secondary | ICD-10-CM

## 2022-10-16 DIAGNOSIS — R200 Anesthesia of skin: Secondary | ICD-10-CM | POA: Insufficient documentation

## 2022-10-16 DIAGNOSIS — G834 Cauda equina syndrome: Secondary | ICD-10-CM

## 2022-10-16 LAB — COMPREHENSIVE METABOLIC PANEL
ALT: 14 U/L (ref 0–44)
AST: 16 U/L (ref 15–41)
Albumin: 4 g/dL (ref 3.5–5.0)
Alkaline Phosphatase: 89 U/L (ref 38–126)
Anion gap: 10 (ref 5–15)
BUN: 15 mg/dL (ref 8–23)
CO2: 24 mmol/L (ref 22–32)
Calcium: 9.5 mg/dL (ref 8.9–10.3)
Chloride: 103 mmol/L (ref 98–111)
Creatinine, Ser: 0.8 mg/dL (ref 0.44–1.00)
GFR, Estimated: >60 mL/min (ref >60)
Glucose, Bld: 162 mg/dL — ABNORMAL HIGH (ref 70–99)
Potassium: 3.9 mmol/L (ref 3.5–5.1)
Sodium: 137 mmol/L (ref 135–145)
Total Bilirubin: 0.6 mg/dL (ref 0.3–1.2)
Total Protein: 7 g/dL (ref 6.5–8.1)

## 2022-10-16 LAB — URINALYSIS, ROUTINE W REFLEX MICROSCOPIC
Bilirubin Urine: NEGATIVE
Glucose, UA: NEGATIVE mg/dL
Hgb urine dipstick: NEGATIVE
Ketones, ur: 5 mg/dL — AB
Nitrite: NEGATIVE
Protein, ur: NEGATIVE mg/dL
Specific Gravity, Urine: 1.009 (ref 1.005–1.030)
pH: 7 (ref 5.0–8.0)

## 2022-10-16 LAB — CBC WITH DIFFERENTIAL/PLATELET
Abs Immature Granulocytes: 0.03 10*3/uL (ref 0.00–0.07)
Basophils Absolute: 0.1 10*3/uL (ref 0.0–0.1)
Basophils Relative: 1 %
Eosinophils Absolute: 0.2 10*3/uL (ref 0.0–0.5)
Eosinophils Relative: 2 %
HCT: 32.2 % — ABNORMAL LOW (ref 36.0–46.0)
Hemoglobin: 10.7 g/dL — ABNORMAL LOW (ref 12.0–15.0)
Immature Granulocytes: 1 %
Lymphocytes Relative: 21 %
Lymphs Abs: 1.4 10*3/uL (ref 0.7–4.0)
MCH: 28.2 pg (ref 26.0–34.0)
MCHC: 33.2 g/dL (ref 30.0–36.0)
MCV: 85 fL (ref 80.0–100.0)
Monocytes Absolute: 0.4 10*3/uL (ref 0.1–1.0)
Monocytes Relative: 6 %
Neutro Abs: 4.4 10*3/uL (ref 1.7–7.7)
Neutrophils Relative %: 69 %
Platelets: 515 10*3/uL — ABNORMAL HIGH (ref 150–400)
RBC: 3.79 MIL/uL — ABNORMAL LOW (ref 3.87–5.11)
RDW: 16.5 % — ABNORMAL HIGH (ref 11.5–15.5)
WBC: 6.4 10*3/uL (ref 4.0–10.5)
nRBC: 0 % (ref 0.0–0.2)

## 2022-10-16 MED ORDER — FOSFOMYCIN TROMETHAMINE 3 G PO PACK
3.0000 g | PACK | Freq: Once | ORAL | Status: AC
  Administered 2022-10-16: 3 g via ORAL
  Filled 2022-10-16: qty 3

## 2022-10-16 MED ORDER — HALOPERIDOL LACTATE 5 MG/ML IJ SOLN
2.5000 mg | Freq: Once | INTRAMUSCULAR | Status: AC
  Administered 2022-10-16: 2.5 mg via INTRAMUSCULAR
  Filled 2022-10-16: qty 1

## 2022-10-16 MED ORDER — ACETAMINOPHEN 325 MG PO TABS
650.0000 mg | ORAL_TABLET | Freq: Once | ORAL | Status: AC
  Administered 2022-10-16: 650 mg via ORAL
  Filled 2022-10-16: qty 2

## 2022-10-16 NOTE — ED Notes (Signed)
Pt is repeatedly on her call bell about being uncomfortable and having severe back pain. MD is aware.

## 2022-10-16 NOTE — ED Triage Notes (Signed)
BIB [facility] from  surgical center. Pt lives at [facility]. Back surgery x 1 week ago. Called for numbness. yesterday numbness in right hand and arm. Seen here yesterday and was cleared. Today numbness has increased and has some vomiting as well. [facility]ocate with her.  147/83 82 98% on RA  186 BGL  20 RAC 4 Zofran

## 2022-10-16 NOTE — ED Provider Notes (Signed)
[facility] Provider Note    Event Date/Time   First MD Initiated Contact with Patient 10/16/22 1137     (approximate)   History   Numbness   HPI  Tamara [name] is a 76 y.o. female extensive past medical history neurocognitive impairment presents to the ER for evaluation of numbness in her legs not feeling well.  Feels very anxious.  Just had recent lumbar surgery.  Has well-documented chronic numbness.  Was seen in neurosurgery clinic today with reassuring exam.  She denies any chest pain or shortness of breath.     Physical Exam   Triage Vital Signs: ED Triage Vitals  Enc Vitals Group     BP 10/16/22 1126 138/70     Pulse Rate 10/16/22 1126 82     Resp 10/16/22 1126 16     Temp 10/16/22 1126 98 F (36.7 C)     Temp Source 10/16/22 1126 Oral     SpO2 10/16/22 1126 99 %     Weight 10/16/22 1128 139 lb 15.9 oz (63.5 kg)     Height 10/16/22 1128 5\' 7"  (1.702 m)     Head Circumference --      Peak Flow --      Pain Score 10/16/22 1128 7     Pain Loc --      Pain Edu? --      Excl. in GC? --     Most recent vital signs: Vitals:   10/16/22 1200 10/16/22 1300  BP: (!) 118/91 138/84  Pulse: 91 61  Resp: (!) 23 18  Temp:    SpO2: 100% 100%     Constitutional: Alert, anxious appearing Eyes: Conjunctivae are normal.  Head: Atraumatic. Nose: No congestion/rhinnorhea. Mouth/Throat: Mucous membranes are moist.   Neck: Painless ROM.  Cardiovascular:   Good peripheral circulation. Respiratory: Normal respiratory effort.  No retractions.  Gastrointestinal: Soft and nontender.  Musculoskeletal:  no deformity Neurologic:  MAE spontaneously. Good strength throughout No gross focal neurologic deficits are appreciated.  Skin:  Skin is warm, dry and intact. No rash noted. Psychiatric: Mood and affect are normal. Speech and behavior are normal.    ED Results / Procedures / Treatments   Labs (all labs ordered are listed, but only abnormal  results are displayed) Labs Reviewed  CBC WITH DIFFERENTIAL/PLATELET - Abnormal; Notable for the following components:      Result Value   RBC 3.79 (*)    Hemoglobin 10.7 (*)    HCT 32.2 (*)    RDW 16.5 (*)    Platelets 515 (*)    All other components within normal limits  COMPREHENSIVE METABOLIC PANEL - Abnormal; Notable for the following components:   Glucose, Bld 162 (*)    All other components within normal limits  URINALYSIS, ROUTINE W REFLEX MICROSCOPIC - Abnormal; Notable for the following components:   Color, Urine YELLOW (*)    APPearance CLEAR (*)    Ketones, ur 5 (*)    Leukocytes,Ua SMALL (*)    Bacteria, UA RARE (*)    All other components within normal limits     EKG     RADIOLOGY    PROCEDURES:  Critical Care performed: No  Procedures   MEDICATIONS ORDERED IN ED: Medications  fosfomycin (MONUROL) packet 3 g (has no administration in time range)  haloperidol lactate (HALDOL) injection 2.5 mg (2.5 mg Intramuscular Given 10/16/22 1227)  acetaminophen (TYLENOL) tablet 650 mg (650 mg Oral Given 10/16/22 1225)  IMPRESSION / MDM / ASSESSMENT AND PLAN / ED COURSE  I reviewed the triage vital signs and the nursing notes.                              Differential diagnosis includes, but is not limited to, anxiety, paresthesia, spinal stenosis, cauda equina, uti  Patient presenting to the ER for evaluation of symptoms as described above.  Based on symptoms, risk factors and considered above differential, this presenting complaint could reflect a potentially life-threatening illness therefore the patient will be placed on continuous pulse oximetry and telemetry for monitoring.  Laboratory evaluation will be sent to evaluate for the above complaints.  Patient with reassuring neuro exam with no acute changes or deficits.  Does appear significantly anxious we will give anxiolysis.    Clinical Course as of 10/16/22 1340  Fri Oct 16, 2022  1258 Patient  reassessed.  Feels improved after anxiolysis. [PR]  1339 Patient reassessed remains well-appearing in no acute distress.  Does appear stable and appropriate for outpatient follow-up. [PR]    Clinical Course User Index [PR] Willy Eddy, MD    FINAL CLINICAL IMPRESSION(S) / ED DIAGNOSES   Final diagnoses:  Numbness     Rx / DC Orders   ED Discharge Orders     None        Note:  This document was prepared using Dragon voice recognition software and may include unintentional dictation errors.    Willy Eddy, MD 10/16/22 1340

## 2022-10-16 NOTE — Addendum Note (Signed)
Addended by: Sharlot Gowda on: 10/16/2022 11:15 AM   Modules accepted: Orders

## 2022-10-16 NOTE — ED Notes (Signed)
This RN called her care giver to arrange transportation. He will call me back once arranged.

## 2022-10-16 NOTE — ED Notes (Signed)
Labs collected and sent down  

## 2022-10-17 LAB — URINE CULTURE: Culture: NO GROWTH

## 2022-10-20 ENCOUNTER — Ambulatory Visit (INDEPENDENT_AMBULATORY_CARE_PROVIDER_SITE_OTHER): Payer: Medicare Other | Admitting: Neurosurgery

## 2022-10-20 ENCOUNTER — Encounter: Payer: Self-pay | Admitting: Neurosurgery

## 2022-10-20 VITALS — BP 128/68 | Ht 67.0 in | Wt 139.0 lb

## 2022-10-20 DIAGNOSIS — M5116 Intervertebral disc disorders with radiculopathy, lumbar region: Secondary | ICD-10-CM

## 2022-10-20 DIAGNOSIS — Z09 Encounter for follow-up examination after completed treatment for conditions other than malignant neoplasm: Secondary | ICD-10-CM

## 2022-10-20 DIAGNOSIS — Z9889 Other specified postprocedural states: Secondary | ICD-10-CM

## 2022-10-20 NOTE — Progress Notes (Signed)
REFERRING PHYSICIAN:  [facility] [address] [place],  [place] [address]  DOS: 10/03/22  L2-L3 discectomy (with Dr. Adriana Simas)  HISTORY OF PRESENT ILLNESS: Tamara [name] is status post L2-L3 discectomy.  Recently seen in the emergency department due to worsening numbness.  She says that this is resolved.  She is having some trouble standing up fully straight but is otherwise doing well.  She has no pain.   PHYSICAL EXAMINATION:  General: Patient is well developed, well nourished.   NEUROLOGICAL:  General: In no acute distress.   Awake, alert, oriented to person, place, and time.  Pupils equal round and reactive to light.  Facial tone is symmetric.    She appears anxious.    Strength:            Side Iliopsoas Quads Hamstring PF DF EHL  R 5 5 5 5 5 5   L 5 5 5 5 5 5    Incision c/d/I  Sensation intact  Strength in bilateral upper extremities in 5/5, sensation diminished in both hands.   All four extremities are warm to the touch.   ROS (Neurologic):  Negative except as noted above  IMAGING: Addendum 10/16/22:  CT of lumbar spine dated 10/15/22:  FINDINGS: Segmentation: There are 5 non-rib-bearing lumbar-type vertebral bodies.   Alignment: 4 mm retrolisthesis of L1 on L2 is slightly increased from 3 mm on 07/21/2022. 4 mm retrolisthesis of L2 on L3 and 3 mm grade 1 anterolisthesis of L5 on S1, unchanged.   Vertebrae: Postsurgical changes of L3 through S1 bilateral transpedicular rod and screw fusion with associated intervertebral disc spacers, as on prior. Anterior L5-S1 fixation screws are again seen. No evidence of hardware failure.   There is worsened severe L1-2 disc space narrowing. Previously there was moderate cystic change/past degenerative erosion of the anterior inferior left L1 vertebral body, and this is not simply changed from 07/21/2012. Severe right L1-2 disc space narrowing with bone-on-bone contact, subchondral sclerosis, and minimal  early degenerative erosion is worsened from 07/21/2022. There is mild horizontal curvilinear lucency within the anterior inferior right L1 vertebral body that may represent subacute fracturing. No surrounding soft tissue inflammatory changes are seen to specifically indicate discitis/osteomyelitis. Approximately 7 mm right lateral listhesis of L1 on L2 is minimally increased from 5 mm previously.   Mild-to-moderate T10-11, moderate T11-12, and moderate to severe T12-L1 disc space narrowing is similar to prior. Moderate anterior inferior T12 endplate degenerative Schmorl's nodes. Moderate multilevel degenerative bridging osteophytes from the T10-11 through T12-L1 level, similar to prior. Moderate to severe right-greater-than-left L2-3 disc space narrowing.   Paraspinal and other soft tissues: Mild abdominal aorta atherosclerotic calcifications. No ovarian suffering aortic left renal vein. Fluid density partially exophytic 1.7 cm posterior right renal cyst is similar to prior. No follow-up imaging is recommended. 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 No renal stone is seen. Moderate to high-grade sigmoid diverticulosis without definite inflammatory change.   Disc levels: T12-L1: Mild-to-moderate bilateral facet joint hypertrophy. Mild retrolisthesis. Moderate broad-based posterior disc osteophyte complex. Severe left and moderate right neuroforaminal stenosis. Moderate central canal stenosis.   L1-2: Moderate bilateral facet joint hypertrophy. Mild retrolisthesis. Mild to moderate broad-based posterior disc osteophyte complex. Moderate right and mild left neuroforaminal stenosis. Moderate central canal stenosis, similar to prior.   L2-3: Status post right hemilaminotomy. Moderate to severe bilateral facet joint hypertrophy. Moderate broad-based posterior disc osteophyte complex. Moderate bilateral neuroforaminal stenosis. Mild-to-moderate central  canal stenosis.   L3-4: Status  post discectomy and posterior instrumented fusion. Moderate bilateral facet joint hypertrophy. The central canal and bilateral neural foramina appear patent.   L4-5: Moderate to severe bilateral facet joint hypertrophy. Status post discectomy and posterior instrumented fusion. Unchanged moderate to severe right neuroforaminal stenosis. The left neural foramina is patent. Mild central canal stenosis.   L5-S1: Mild bilateral facet joint hypertrophy. Grade 1 anterolisthesis. Status post discectomy and posterior instrumented fusion. Mild-to-moderate bilateral neuroforaminal narrowing, unchanged. No central canal stenosis.   IMPRESSION: Compared to 07/21/2022:   1. Redemonstration of L3 through S1 bilateral transpedicular rod and screw fusion with intervertebral disc spacers. No evidence of hardware failure. 2. Worsened severe L1-2 disc space narrowing and right-greater-than-left L1-2 disc space narrowing with bone-on-bone contact. Unchanged mild high-grade inferior left L1 vertebral body degenerate erosions with new mild inferior anterior right L1 vertebral body degenerative erosions. These findings are favored to be secondary to worsening degenerative disc and endplate changes without infection. No surrounding soft tissue inflammatory changes are seen to specifically indicate discitis/osteomyelitis, however recommend clinical correlation. 3. Multilevel degenerative disc and joint changes and central canal and neuroforaminal stenoses as above. 4. Mild-to-moderate chronic right hydroureter, similar to 07/21/2022. No renal or ureteral stone is seen.     Electronically Signed   By: Neita Garnet M.D.   On: 10/15/2022 12:11  I have personally reviewed the images and agree with the above interpretation.     ASSESSMENT/PLAN:  Tamara [name] is doing well s/p above surgery.  I will see her back in a few weeks.   Tamara Night MD Department of  neurosurgery

## 2022-11-17 ENCOUNTER — Encounter: Payer: Medicare Other | Admitting: Neurosurgery

## 2022-11-17 ENCOUNTER — Encounter (INDEPENDENT_AMBULATORY_CARE_PROVIDER_SITE_OTHER): Payer: TRICARE For Life (TFL) | Admitting: Vascular Surgery

## 2022-11-17 ENCOUNTER — Encounter (INDEPENDENT_AMBULATORY_CARE_PROVIDER_SITE_OTHER): Payer: Medicare Other

## 2022-12-08 ENCOUNTER — Encounter: Payer: Medicare Other | Admitting: Neurosurgery

## 2022-12-15 ENCOUNTER — Encounter: Payer: Medicare Other | Admitting: Neurosurgery

## 2022-12-21 ENCOUNTER — Other Ambulatory Visit (INDEPENDENT_AMBULATORY_CARE_PROVIDER_SITE_OTHER): Payer: Self-pay | Admitting: Nurse Practitioner

## 2022-12-21 DIAGNOSIS — I739 Peripheral vascular disease, unspecified: Secondary | ICD-10-CM

## 2022-12-22 ENCOUNTER — Ambulatory Visit (INDEPENDENT_AMBULATORY_CARE_PROVIDER_SITE_OTHER): Payer: Medicare Other

## 2022-12-22 ENCOUNTER — Encounter (INDEPENDENT_AMBULATORY_CARE_PROVIDER_SITE_OTHER): Payer: Self-pay | Admitting: Vascular Surgery

## 2022-12-22 ENCOUNTER — Ambulatory Visit (INDEPENDENT_AMBULATORY_CARE_PROVIDER_SITE_OTHER): Payer: Medicare Other | Admitting: Vascular Surgery

## 2022-12-22 VITALS — BP 126/79 | HR 75 | Resp 16 | Wt 161.2 lb

## 2022-12-22 DIAGNOSIS — I739 Peripheral vascular disease, unspecified: Secondary | ICD-10-CM | POA: Diagnosis not present

## 2022-12-22 DIAGNOSIS — I10 Essential (primary) hypertension: Secondary | ICD-10-CM

## 2022-12-22 DIAGNOSIS — E1122 Type 2 diabetes mellitus with diabetic chronic kidney disease: Secondary | ICD-10-CM | POA: Diagnosis not present

## 2022-12-22 DIAGNOSIS — R230 Cyanosis: Secondary | ICD-10-CM | POA: Diagnosis not present

## 2022-12-22 DIAGNOSIS — E782 Mixed hyperlipidemia: Secondary | ICD-10-CM | POA: Diagnosis not present

## 2022-12-22 DIAGNOSIS — N183 Chronic kidney disease, stage 3 unspecified: Secondary | ICD-10-CM

## 2022-12-22 NOTE — Assessment & Plan Note (Signed)
blood pressure control important in reducing the progression of atherosclerotic disease. On appropriate oral medications.  

## 2022-12-22 NOTE — Assessment & Plan Note (Signed)
lipid control important in reducing the progression of atherosclerotic disease. Continue statin therapy  

## 2022-12-22 NOTE — Assessment & Plan Note (Signed)
We performed ABIs today which were 1.17 on the right and 1.18 on the left with multiphasic waveforms consistent with no significant arterial insufficiency.  Her symptoms are likely a combination of neuropathic issues with autonomic neuropathy as well as some degree of vasospastic issues like a Raynaud's phenomenon.  Neither would require any intervention and she is not that symptomatic from this.  Compression socks can help her intermittent swelling and may help also avoid cold stimulus.  Elevating her legs will also help her swelling.  No vascular workup further as planned and no role for intervention.

## 2022-12-22 NOTE — Progress Notes (Signed)
Patient ID: Tamara [name], female   DOB: 26-Mar-1946, 77 y.o.   MRN: [id number]  Chief Complaint  Patient presents with   New Patient (Initial Visit)    Ref [place] consult PVD    HPI Tamara [name] is a 77 y.o. female.  I am asked to see the patient by Dr. Gabriel Carina for evaluation of PAD.  The patient has marked discoloration which is purplish with sluggish capillary refill to both feet.  She does have intermittent swelling but does not really have a lot of pain in her feet or lower legs.  She denies typical claudication symptoms.  No ulceration or infection.  This does seem to be a bit more pronounced when it is cold.  No previous history of lower extremity vascular disease to her knowledge.  We performed ABIs today which were 1.17 on the right and 1.18 on the left with multiphasic waveforms consistent with no significant arterial insufficiency.   Past Medical History:  Diagnosis Date   Anemia    Chronic kidney disease    Depression    Diabetes mellitus without complication ([place])    GERD (gastroesophageal reflux disease)    Hypertension    Hypothyroidism     Past Surgical History:  Procedure Laterality Date   ACHILLES TENDON REPAIR     BACK SURGERY     HERNIA REPAIR     LUMBAR LAMINECTOMY/DECOMPRESSION MICRODISCECTOMY Right 10/03/2022   Procedure: LUMBAR LAMINECTOMY/DECOMPRESSION MICRODISCECTOMY 1 LEVEL;  Surgeon: Deetta Perla, MD;  Location: [facility] ORS;  Service: Neurosurgery;  Laterality: Right;  Open - McCullouch retractors   SHOULDER SURGERY       Family History  Adopted: Yes  Problem Relation Age of Onset   Cancer Mother    Diabetes Mother   No bleeding or clotting disorders known   Social History   Tobacco Use   Smoking status: Never   Smokeless tobacco: Never  Vaping Use   Vaping Use: Never used  Substance Use Topics   Alcohol use: No   Drug use: No     Allergies  Allergen Reactions   Ace Inhibitors Cough    Current Outpatient Medications  Medication Sig  Dispense Refill   acetaminophen (TYLENOL) 325 MG tablet Take 975 mg by mouth 3 (three) times daily.     aspirin 81 MG chewable tablet Chew 81 mg by mouth daily.     atorvastatin (LIPITOR) 20 MG tablet Take 20 mg by mouth at bedtime.     baclofen (LIORESAL) 10 MG tablet Take 1 tablet (10 mg total) by mouth 3 (three) times daily as needed for muscle spasms. 30 each 0   BuPROPion HCl ER, XL, 450 MG TB24 Take 450 mg by mouth daily.     busPIRone (BUSPAR) 7.5 MG tablet Take 7.5 mg by mouth 2 (two) times daily before a meal.     calcitonin, salmon, (MIACALCIN/FORTICAL) 200 UNIT/ACT nasal spray Place 1 spray into alternate nostrils daily.     celecoxib (CELEBREX) 200 MG capsule Take 1 capsule (200 mg total) by mouth 2 (two) times daily. 60 capsule 0   esomeprazole (NEXIUM) 20 MG capsule Take 20 mg by mouth daily. To prevent coughing after eating.     insulin glargine (LANTUS) 100 UNIT/ML Solostar Pen Inject 10 Units into the skin daily. (Patient taking differently: Inject 15 Units into the skin daily.) 15 mL 0   lamoTRIgine (LAMICTAL) 150 MG tablet Take 150 mg by mouth daily.     levothyroxine (SYNTHROID) 112 MCG tablet  Take 112 mcg by mouth daily.     magnesium oxide (MAG-OX) 400 MG tablet Take 2 tablets by mouth daily.     metFORMIN (GLUCOPHAGE) 500 MG tablet Take 500 mg by mouth 2 (two) times daily.     oxyCODONE-acetaminophen (PERCOCET/ROXICET) 5-325 MG tablet Take 1 tablet by mouth every 4 (four) hours as needed for severe pain.     sennosides-docusate sodium (SENOKOT-S) 8.6-50 MG tablet Take 2 tablets by mouth daily.     valsartan (DIOVAN) 80 MG tablet Take 80 mg by mouth daily.     No current facility-administered medications for this visit.      REVIEW OF SYSTEMS (Negative unless checked)  Constitutional: [] Weight loss  [] Fever  [] Chills Cardiac: [] Chest pain   [] Chest pressure   [] Palpitations   [] Shortness of breath when laying flat   [] Shortness of breath at rest   [] Shortness of  breath with exertion. Vascular:  [] Pain in legs with walking   [] Pain in legs at rest   [] Pain in legs when laying flat   [] Claudication   [] Pain in feet when walking  [] Pain in feet at rest  [] Pain in feet when laying flat   [] History of DVT   [] Phlebitis   [x] Swelling in legs   [] Varicose veins   [] Non-healing ulcers Pulmonary:   [] Uses home oxygen   [] Productive cough   [] Hemoptysis   [] Wheeze  [] COPD   [] Asthma Neurologic:  [] Dizziness  [] Blackouts   [] Seizures   [] History of stroke   [] History of TIA  [] Aphasia   [] Temporary blindness   [] Dysphagia   [] Weakness or numbness in arms   [] Weakness or numbness in legs Musculoskeletal:  [] Arthritis   [] Joint swelling   [] Joint pain   [] Low back pain Hematologic:  [] Easy bruising  [] Easy bleeding   [] Hypercoagulable state   [x] Anemic  [] Hepatitis Gastrointestinal:  [] Blood in stool   [] Vomiting blood  [x] Gastroesophageal reflux/heartburn   [] Abdominal pain Genitourinary:  [x] Chronic kidney disease   [] Difficult urination  [] Frequent urination  [] Burning with urination   [] Hematuria Skin:  [] Rashes   [] Ulcers   [] Wounds Psychological:  [] History of anxiety   [x]  History of major depression.    Physical Exam BP 126/79 (BP Location: Left Arm)   Pulse 75   Resp 16   Wt 161 lb 3.2 oz (73.1 kg)   BMI 25.25 kg/m  Gen:  WD/WN, NAD Head: [place]/AT, No temporalis wasting.  Ear/Nose/Throat: Hearing grossly intact, nares w/o erythema or drainage, oropharynx w/o Erythema/Exudate Eyes: Conjunctiva clear, sclera non-icteric  Neck: trachea midline.  No JVD.  Pulmonary:  Good air movement, respirations not labored, no use of accessory muscles  Cardiac: RRR, no JVD Vascular:  Vessel Right Left  Radial Palpable Palpable                          DP Plapable  Palpable   PT Palpable  Palpable    Gastrointestinal:. No masses, surgical incisions, or scars. Musculoskeletal: M/S 5/5 throughout.  Extremities without ischemic changes.  No deformity or atrophy.   Sluggish capillary refill with cyanosis is present in both lower extremities.  No appreciable lower extremity edema. Neurologic: Sensation grossly intact in extremities.  Symmetrical.  Speech is fluent. Motor exam as listed above. Psychiatric: Judgment intact, Mood & affect appropriate for pt's clinical situation. Dermatologic: No rashes or ulcers noted.  No cellulitis or open wounds.    Radiology No results found.  Labs Recent Results (from the  past 2160 hour(s))  CBC with Differential     Status: Abnormal   Collection Time: 10/02/22  5:59 AM  Result Value Ref Range   WBC 6.2 4.0 - 10.5 K/uL   RBC 4.28 3.87 - 5.11 MIL/uL   Hemoglobin 11.7 (L) 12.0 - 15.0 g/dL   HCT 36.5 36.0 - 46.0 %   MCV 85.3 80.0 - 100.0 fL   MCH 27.3 26.0 - 34.0 pg   MCHC 32.1 30.0 - 36.0 g/dL   RDW 18.3 (H) 11.5 - 15.5 %   Platelets 423 (H) 150 - 400 K/uL   nRBC 0.0 0.0 - 0.2 %   Neutrophils Relative % 57 %   Neutro Abs 3.6 1.7 - 7.7 K/uL   Lymphocytes Relative 30 %   Lymphs Abs 1.9 0.7 - 4.0 K/uL   Monocytes Relative 8 %   Monocytes Absolute 0.5 0.1 - 1.0 K/uL   Eosinophils Relative 3 %   Eosinophils Absolute 0.2 0.0 - 0.5 K/uL   Basophils Relative 1 %   Basophils Absolute 0.1 0.0 - 0.1 K/uL   Immature Granulocytes 1 %   Abs Immature Granulocytes 0.03 0.00 - 0.07 K/uL    Comment: Performed at [facility], [address] [place], [place] [address]  Comprehensive metabolic panel     Status: Abnormal   Collection Time: 10/02/22  5:59 AM  Result Value Ref Range   Sodium 142 135 - 145 mmol/L   Potassium 4.7 3.5 - 5.1 mmol/L   Chloride 105 98 - 111 mmol/L   CO2 26 22 - 32 mmol/L   Glucose, Bld 144 (H) 70 - 99 mg/dL    Comment: Glucose reference range applies only to samples taken after fasting for at least 8 hours.   BUN 12 8 - 23 mg/dL   Creatinine, Ser 0.98 0.44 - 1.00 mg/dL   Calcium 9.9 8.9 - 10.3 mg/dL   Total Protein 7.2 6.5 - 8.1 g/dL   Albumin 3.8 3.5 - 5.0 g/dL   AST 21 15 -  41 U/L   ALT 21 0 - 44 U/L   Alkaline Phosphatase 95 38 - 126 U/L   Total Bilirubin 0.6 0.3 - 1.2 mg/dL   GFR, Estimated >60 >60 mL/min    Comment: (NOTE) Calculated using the CKD-EPI Creatinine Equation (2021)    Anion gap 11 5 - 15    Comment: Performed at [facility], [address] [place], [place] [address]  Glucose, capillary     Status: Abnormal   Collection Time: 10/02/22 10:53 PM  Result Value Ref Range   Glucose-Capillary 115 (H) 70 - 99 mg/dL    Comment: Glucose reference range applies only to samples taken after fasting for at least 8 hours.   Comment 1 Notify RN   Surgical PCR screen     Status: Abnormal   Collection Time: 10/03/22  1:01 AM   Specimen: Nasal Mucosa; Nasal Swab  Result Value Ref Range   MRSA, PCR NEGATIVE NEGATIVE   Staphylococcus aureus POSITIVE (A) NEGATIVE    Comment: (NOTE) The Xpert SA Assay (FDA approved for NASAL specimens in patients 69 years of age and older), is one component of a comprehensive surveillance program. It is not intended to diagnose infection nor to guide or monitor treatment. Performed at [facility], [address] [place],  [address]   Glucose, capillary     Status: Abnormal   Collection Time: 10/03/22  9:08 AM  Result Value Ref Range  Glucose-Capillary 171 (H) 70 - 99 mg/dL    Comment: Glucose reference range applies only to samples taken after fasting for at least 8 hours.  Glucose, capillary     Status: Abnormal   Collection Time: 10/03/22  1:10 PM  Result Value Ref Range   Glucose-Capillary 120 (H) 70 - 99 mg/dL    Comment: Glucose reference range applies only to samples taken after fasting for at least 8 hours.  CBC     Status: Abnormal   Collection Time: 10/03/22  3:58 PM  Result Value Ref Range   WBC 11.1 (H) 4.0 - 10.5 K/uL   RBC 4.10 3.87 - 5.11 MIL/uL   Hemoglobin 11.4 (L) 12.0 - 15.0 g/dL   HCT 52.8 (L) 41.3 - 24.4 %   MCV 85.1 80.0 - 100.0 fL   MCH 27.8 26.0 - 34.0 pg    MCHC 32.7 30.0 - 36.0 g/dL   RDW 01.0 (H) 27.2 - 53.6 %   Platelets 374 150 - 400 K/uL   nRBC 0.0 0.0 - 0.2 %    Comment: Performed at [facility], [address] [place], [place] [address]  Creatinine, serum     Status: None   Collection Time: 10/03/22  3:58 PM  Result Value Ref Range   Creatinine, Ser 0.89 0.44 - 1.00 mg/dL   GFR, Estimated >47 >42 mL/min    Comment: (NOTE) Calculated using the CKD-EPI Creatinine Equation (2021) Performed at [facility], [address] [place], [place] [address]   Comprehensive metabolic panel     Status: Abnormal   Collection Time: 10/15/22 10:11 AM  Result Value Ref Range   Sodium 138 135 - 145 mmol/L   Potassium 4.2 3.5 - 5.1 mmol/L   Chloride 103 98 - 111 mmol/L   CO2 23 22 - 32 mmol/L   Glucose, Bld 131 (H) 70 - 99 mg/dL    Comment: Glucose reference range applies only to samples taken after fasting for at least 8 hours.   BUN 13 8 - 23 mg/dL   Creatinine, Ser 8.75 0.44 - 1.00 mg/dL   Calcium 9.9 8.9 - 64.3 mg/dL   Total Protein 7.5 6.5 - 8.1 g/dL   Albumin 4.0 3.5 - 5.0 g/dL   AST 22 15 - 41 U/L   ALT 14 0 - 44 U/L   Alkaline Phosphatase 85 38 - 126 U/L   Total Bilirubin 0.5 0.3 - 1.2 mg/dL   GFR, Estimated >32 >95 mL/min    Comment: (NOTE) Calculated using the CKD-EPI Creatinine Equation (2021)    Anion gap 12 5 - 15    Comment: Performed at [facility], [address] [place], [place] [address]  Lipase, blood     Status: None   Collection Time: 10/15/22 10:11 AM  Result Value Ref Range   Lipase 32 11 - 51 U/L    Comment: Performed at [facility], [address] [place], [place] [address]  CBC with Differential     Status: Abnormal   Collection Time: 10/15/22 10:11 AM  Result Value Ref Range   WBC 5.5 4.0 - 10.5 K/uL   RBC 4.00 3.87 - 5.11 MIL/uL   Hemoglobin 11.2 (L) 12.0 - 15.0 g/dL   HCT 01.6 (L) 01.0 - 93.2 %   MCV 85.5 80.0 - 100.0 fL   MCH 28.0 26.0 - 34.0 pg   MCHC  32.7 30.0 - 36.0 g/dL   RDW 35.5 (H) 73.2 - 20.2 %  Platelets 507 (H) 150 - 400 K/uL   nRBC 0.0 0.0 - 0.2 %   Neutrophils Relative % 58 %   Neutro Abs 3.2 1.7 - 7.7 K/uL   Lymphocytes Relative 29 %   Lymphs Abs 1.6 0.7 - 4.0 K/uL   Monocytes Relative 8 %   Monocytes Absolute 0.4 0.1 - 1.0 K/uL   Eosinophils Relative 4 %   Eosinophils Absolute 0.2 0.0 - 0.5 K/uL   Basophils Relative 1 %   Basophils Absolute 0.1 0.0 - 0.1 K/uL   Immature Granulocytes 0 %   Abs Immature Granulocytes 0.02 0.00 - 0.07 K/uL    Comment: Performed at [facility], [address] [place], [place] [address]  Resp Panel by RT-PCR (Flu A&B, Covid) Anterior Nasal Swab     Status: None   Collection Time: 10/15/22 10:11 AM   Specimen: Anterior Nasal Swab  Result Value Ref Range   SARS Coronavirus 2 by RT PCR NEGATIVE NEGATIVE    Comment: (NOTE) SARS-CoV-2 target nucleic acids are NOT DETECTED.  The SARS-CoV-2 RNA is generally detectable in upper respiratory specimens during the acute phase of infection. The lowest concentration of SARS-CoV-2 viral copies this assay can detect is 138 copies/mL. A negative result does not preclude SARS-Cov-2 infection and should not be used as the sole basis for treatment or other patient management decisions. A negative result may occur with  improper specimen collection/handling, submission of specimen other than nasopharyngeal swab, presence of viral mutation(s) within the areas targeted by this assay, and inadequate number of viral copies(<138 copies/mL). A negative result must be combined with clinical observations, patient history, and epidemiological information. The expected result is Negative.  Fact Sheet for Patients:  EntrepreneurPulse.com.au  Fact Sheet for Healthcare Providers:  IncredibleEmployment.be  This test is no t yet approved or cleared by the [place] FDA and  has been authorized for detection  and/or diagnosis of SARS-CoV-2 by FDA under an Emergency Use Authorization (EUA). This EUA will remain  in effect (meaning this test can be used) for the duration of the COVID-19 declaration under Section 564(b)(1) of the Act, 21 U.S.C.section 360bbb-3(b)(1), unless the authorization is terminated  or revoked sooner.       Influenza A by PCR NEGATIVE NEGATIVE   Influenza B by PCR NEGATIVE NEGATIVE    Comment: (NOTE) The Xpert Xpress SARS-CoV-2/FLU/RSV plus assay is intended as an aid in the diagnosis of influenza from Nasopharyngeal swab specimens and should not be used as a sole basis for treatment. Nasal washings and aspirates are unacceptable for Xpert Xpress SARS-CoV-2/FLU/RSV testing.  Fact Sheet for Patients: EntrepreneurPulse.com.au  Fact Sheet for Healthcare Providers: IncredibleEmployment.be  This test is not yet approved or cleared by the [place] FDA and has been authorized for detection and/or diagnosis of SARS-CoV-2 by FDA under an Emergency Use Authorization (EUA). This EUA will remain in effect (meaning this test can be used) for the duration of the COVID-19 declaration under Section 564(b)(1) of the Act, 21 U.S.C. section 360bbb-3(b)(1), unless the authorization is terminated or revoked.  Performed at [facility], [address] [place], [place] [address]   Urinalysis, Routine w reflex microscopic Urine, In & Out Cath     Status: Abnormal   Collection Time: 10/15/22 11:53 AM  Result Value Ref Range   Color, Urine STRAW (A) YELLOW   APPearance CLEAR (A) CLEAR   Specific Gravity, Urine 1.017 1.005 - 1.030   pH 8.0 5.0 - 8.0   Glucose,  UA NEGATIVE NEGATIVE mg/dL   Hgb urine dipstick NEGATIVE NEGATIVE   Bilirubin Urine NEGATIVE NEGATIVE   Ketones, ur NEGATIVE NEGATIVE mg/dL   Protein, ur NEGATIVE NEGATIVE mg/dL   Nitrite NEGATIVE NEGATIVE   Leukocytes,Ua NEGATIVE NEGATIVE    Comment: Performed at [facility], [address] [place], [place] [address]  Urine Culture     Status: None   Collection Time: 10/15/22 11:53 AM   Specimen: Urine, Clean Catch  Result Value Ref Range   Specimen Description      URINE, CLEAN CATCH Performed at [facility], [address], [place], [place] [address]    Special Requests      NONE Performed at [facility], [address] [place], [place] [address]    Culture      NO GROWTH Performed at [facility], [address], [place], [place] [address]    Report Status 10/17/2022 FINAL   CBC with Differential     Status: Abnormal   Collection Time: 10/16/22 11:25 AM  Result Value Ref Range   WBC 6.4 4.0 - 10.5 K/uL   RBC 3.79 (L) 3.87 - 5.11 MIL/uL   Hemoglobin 10.7 (L) 12.0 - 15.0 g/dL   HCT 32.2 (L) 36.0 - 46.0 %   MCV 85.0 80.0 - 100.0 fL   MCH 28.2 26.0 - 34.0 pg   MCHC 33.2 30.0 - 36.0 g/dL   RDW 16.5 (H) 11.5 - 15.5 %   Platelets 515 (H) 150 - 400 K/uL   nRBC 0.0 0.0 - 0.2 %   Neutrophils Relative % 69 %   Neutro Abs 4.4 1.7 - 7.7 K/uL   Lymphocytes Relative 21 %   Lymphs Abs 1.4 0.7 - 4.0 K/uL   Monocytes Relative 6 %   Monocytes Absolute 0.4 0.1 - 1.0 K/uL   Eosinophils Relative 2 %   Eosinophils Absolute 0.2 0.0 - 0.5 K/uL   Basophils Relative 1 %   Basophils Absolute 0.1 0.0 - 0.1 K/uL   Immature Granulocytes 1 %   Abs Immature Granulocytes 0.03 0.00 - 0.07 K/uL    Comment: Performed at [facility], [address] [place], [place] [address]  Comprehensive metabolic panel     Status: Abnormal   Collection Time: 10/16/22 11:25 AM  Result Value Ref Range   Sodium 137 135 - 145 mmol/L   Potassium 3.9 3.5 - 5.1 mmol/L   Chloride 103 98 - 111 mmol/L   CO2 24 22 - 32 mmol/L   Glucose, Bld 162 (H) 70 - 99 mg/dL    Comment: Glucose reference range applies only to samples taken after fasting for at least 8 hours.   BUN 15 8 - 23 mg/dL   Creatinine, Ser 0.80 0.44 - 1.00 mg/dL   Calcium  9.5 8.9 - 10.3 mg/dL   Total Protein 7.0 6.5 - 8.1 g/dL   Albumin 4.0 3.5 - 5.0 g/dL   AST 16 15 - 41 U/L   ALT 14 0 - 44 U/L   Alkaline Phosphatase 89 38 - 126 U/L   Total Bilirubin 0.6 0.3 - 1.2 mg/dL   GFR, Estimated >60 >60 mL/min    Comment: (NOTE) Calculated using the CKD-EPI Creatinine Equation (2021)    Anion gap 10 5 - 15    Comment: Performed at [facility], [address] [place], [place] [address]  Urinalysis, Routine w reflex microscopic     Status: Abnormal   Collection Time: 10/16/22  11:25 AM  Result Value Ref Range   Color, Urine YELLOW (A) YELLOW   APPearance CLEAR (A) CLEAR   Specific Gravity, Urine 1.009 1.005 - 1.030   pH 7.0 5.0 - 8.0   Glucose, UA NEGATIVE NEGATIVE mg/dL   Hgb urine dipstick NEGATIVE NEGATIVE   Bilirubin Urine NEGATIVE NEGATIVE   Ketones, ur 5 (A) NEGATIVE mg/dL   Protein, ur NEGATIVE NEGATIVE mg/dL   Nitrite NEGATIVE NEGATIVE   Leukocytes,Ua SMALL (A) NEGATIVE   RBC / HPF 0-5 0 - 5 RBC/hpf   WBC, UA 6-10 0 - 5 WBC/hpf   Bacteria, UA RARE (A) NONE SEEN   Squamous Epithelial / HPF 0-5 0 - 5    Comment: Performed at [facility], [address], [place], [place] [address]    Assessment/Plan:  Extremity cyanosis We performed ABIs today which were 1.17 on the right and 1.18 on the left with multiphasic waveforms consistent with no significant arterial insufficiency.  Her symptoms are likely a combination of neuropathic issues with autonomic neuropathy as well as some degree of vasospastic issues like a Raynaud's phenomenon.  Neither would require any intervention and she is not that symptomatic from this.  Compression socks can help her intermittent swelling and may help also avoid cold stimulus.  Elevating her legs will also help her swelling.  No vascular workup further as planned and no role for intervention.  Mixed hyperlipidemia lipid control important in reducing the progression of atherosclerotic disease. Continue  statin therapy   Type 2 diabetes mellitus with diabetic chronic kidney disease (HCC) blood glucose control important in reducing the progression of atherosclerotic disease. Also, involved in wound healing. On appropriate medications. Likely a component of neuropathy with her symptoms as well.  Essential hypertension blood pressure control important in reducing the progression of atherosclerotic disease. On appropriate oral medications.      Leotis Pain 12/22/2022, 6:02 PM   This note was created with Dragon medical transcription system.  Any errors from dictation are unintentional.

## 2022-12-22 NOTE — Assessment & Plan Note (Signed)
blood glucose control important in reducing the progression of atherosclerotic disease. Also, involved in wound healing. On appropriate medications. Likely a component of neuropathy with her symptoms as well.

## 2022-12-28 LAB — VAS US ABI WITH/WO TBI
Left ABI: 1.18
Right ABI: 1.17

## 2022-12-29 ENCOUNTER — Encounter: Payer: Self-pay | Admitting: Neurosurgery

## 2022-12-29 ENCOUNTER — Ambulatory Visit (INDEPENDENT_AMBULATORY_CARE_PROVIDER_SITE_OTHER): Payer: Medicare Other | Admitting: Neurosurgery

## 2022-12-29 VITALS — BP 138/76 | HR 85 | Temp 97.8°F | Ht 67.0 in | Wt 158.4 lb

## 2022-12-29 DIAGNOSIS — Z09 Encounter for follow-up examination after completed treatment for conditions other than malignant neoplasm: Secondary | ICD-10-CM

## 2022-12-29 DIAGNOSIS — G834 Cauda equina syndrome: Secondary | ICD-10-CM

## 2022-12-29 DIAGNOSIS — M5116 Intervertebral disc disorders with radiculopathy, lumbar region: Secondary | ICD-10-CM

## 2022-12-29 DIAGNOSIS — Z9889 Other specified postprocedural states: Secondary | ICD-10-CM

## 2022-12-29 NOTE — Progress Notes (Signed)
REFERRING PHYSICIAN:  [facility] [place],  [place] [id number]  DOS: 10/03/22  L2-L3 discectomy (with Dr. Lacinda Axon)  HISTORY OF PRESENT ILLNESS: Tamara [name] is status post L2-L3 discectomy.  She is doing much better.  She is having some back pain above her surgical site.  She had a fall this weekend and hurt her left hand.  PHYSICAL EXAMINATION:  General: Patient is well developed, well nourished.   NEUROLOGICAL:  General: In no acute distress.   Awake, alert, oriented to person, place, and time.  Pupils equal round and reactive to light.  Facial tone is symmetric.      Strength:            Side Iliopsoas Quads Hamstring PF DF EHL  R 5 5 5 5 5 5   L 5 5 5 5 5 5    Incision c/d/I  Sensation intact  Left hand shows extensive bruising.  She is able to move it with full range of motion.  ROS (Neurologic):  Negative except as noted above  IMAGING: Addendum 10/16/22:  CT of lumbar spine dated 10/15/22:  FINDINGS: Segmentation: There are 5 non-rib-bearing lumbar-type vertebral bodies.   Alignment: 4 mm retrolisthesis of L1 on L2 is slightly increased from 3 mm on 07/21/2022. 4 mm retrolisthesis of L2 on L3 and 3 mm grade 1 anterolisthesis of L5 on S1, unchanged.   Vertebrae: Postsurgical changes of L3 through S1 bilateral transpedicular rod and screw fusion with associated intervertebral disc spacers, as on prior. Anterior L5-S1 fixation screws are again seen. No evidence of hardware failure.   There is worsened severe L1-2 disc space narrowing. Previously there was moderate cystic change/past degenerative erosion of the anterior inferior left L1 vertebral body, and this is not simply changed from 07/21/2012. Severe right L1-2 disc space narrowing with bone-on-bone contact, subchondral sclerosis, and minimal early degenerative erosion is worsened from 07/21/2022. There is mild horizontal curvilinear lucency within the anterior inferior right  L1 vertebral body that may represent subacute fracturing. No surrounding soft tissue inflammatory changes are seen to specifically indicate discitis/osteomyelitis. Approximately 7 mm right lateral listhesis of L1 on L2 is minimally increased from 5 mm previously.   Mild-to-moderate T10-11, moderate T11-12, and moderate to severe T12-L1 disc space narrowing is similar to prior. Moderate anterior inferior T12 endplate degenerative Schmorl's nodes. Moderate multilevel degenerative bridging osteophytes from the T10-11 through T12-L1 level, similar to prior. Moderate to severe right-greater-than-left L2-3 disc space narrowing.   Paraspinal and other soft tissues: Mild abdominal aorta atherosclerotic calcifications. No ovarian suffering aortic left renal vein. Fluid density partially exophytic 1.7 cm posterior right renal cyst is similar to prior. No follow-up imaging is recommended. 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 No renal stone is seen. Moderate to high-grade sigmoid diverticulosis without definite inflammatory change.   Disc levels: T12-L1: Mild-to-moderate bilateral facet joint hypertrophy. Mild retrolisthesis. Moderate broad-based posterior disc osteophyte complex. Severe left and moderate right neuroforaminal stenosis. Moderate central canal stenosis.   L1-2: Moderate bilateral facet joint hypertrophy. Mild retrolisthesis. Mild to moderate broad-based posterior disc osteophyte complex. Moderate right and mild left neuroforaminal stenosis. Moderate central canal stenosis, similar to prior.   L2-3: Status post right hemilaminotomy. Moderate to severe bilateral facet joint hypertrophy. Moderate broad-based posterior disc osteophyte complex. Moderate bilateral neuroforaminal stenosis. Mild-to-moderate central canal stenosis.   L3-4: Status post discectomy and posterior instrumented fusion. Moderate bilateral facet joint hypertrophy. The  central canal and bilateral neural foramina appear patent.  L4-5: Moderate to severe bilateral facet joint hypertrophy. Status post discectomy and posterior instrumented fusion. Unchanged moderate to severe right neuroforaminal stenosis. The left neural foramina is patent. Mild central canal stenosis.   L5-S1: Mild bilateral facet joint hypertrophy. Grade 1 anterolisthesis. Status post discectomy and posterior instrumented fusion. Mild-to-moderate bilateral neuroforaminal narrowing, unchanged. No central canal stenosis.   IMPRESSION: Compared to 07/21/2022:   1. Redemonstration of L3 through S1 bilateral transpedicular rod and screw fusion with intervertebral disc spacers. No evidence of hardware failure. 2. Worsened severe L1-2 disc space narrowing and right-greater-than-left L1-2 disc space narrowing with bone-on-bone contact. Unchanged mild high-grade inferior left L1 vertebral body degenerate erosions with new mild inferior anterior right L1 vertebral body degenerative erosions. These findings are favored to be secondary to worsening degenerative disc and endplate changes without infection. No surrounding soft tissue inflammatory changes are seen to specifically indicate discitis/osteomyelitis, however recommend clinical correlation. 3. Multilevel degenerative disc and joint changes and central canal and neuroforaminal stenoses as above. 4. Mild-to-moderate chronic right hydroureter, similar to 07/21/2022. No renal or ureteral stone is seen.     Electronically Signed   By: Yvonne Kendall M.D.   On: 10/15/2022 12:11  I have personally reviewed the images and agree with the above interpretation.     ASSESSMENT/PLAN:  Tamara [name] is doing well s/p above surgery.  I have recommended icing of her left hand as she has extensive bruising.  It will take some time for that to improve.  I will see her back on an as-needed basis.  She should continue physical therapy for her  back pain and balance.   Meade Maw MD Department of neurosurgery
